# Patient Record
Sex: Male | Born: 1960
Health system: Southern US, Community
[De-identification: ages and names within clinical notes are randomized; demographics above are authoritative.]

## PROBLEM LIST (undated history)

## (undated) DIAGNOSIS — R011 Cardiac murmur, unspecified: Secondary | ICD-10-CM

## (undated) DIAGNOSIS — K635 Polyp of colon: Secondary | ICD-10-CM

## (undated) DIAGNOSIS — B353 Tinea pedis: Secondary | ICD-10-CM

## (undated) DIAGNOSIS — K4090 Unilateral inguinal hernia, without obstruction or gangrene, not specified as recurrent: Secondary | ICD-10-CM

## (undated) DIAGNOSIS — J302 Other seasonal allergic rhinitis: Secondary | ICD-10-CM

## (undated) DIAGNOSIS — E669 Obesity, unspecified: Secondary | ICD-10-CM

## (undated) DIAGNOSIS — G4730 Sleep apnea, unspecified: Secondary | ICD-10-CM

## (undated) DIAGNOSIS — Z22322 Carrier or suspected carrier of Methicillin resistant Staphylococcus aureus: Secondary | ICD-10-CM

## (undated) DIAGNOSIS — Z8249 Family history of ischemic heart disease and other diseases of the circulatory system: Secondary | ICD-10-CM

## (undated) DIAGNOSIS — M654 Radial styloid tenosynovitis [de Quervain]: Secondary | ICD-10-CM

## (undated) DIAGNOSIS — Z720 Tobacco use: Secondary | ICD-10-CM

## (undated) DIAGNOSIS — IMO0001 Reserved for inherently not codable concepts without codable children: Secondary | ICD-10-CM

## (undated) DIAGNOSIS — N451 Epididymitis: Secondary | ICD-10-CM

## (undated) DIAGNOSIS — Z973 Presence of spectacles and contact lenses: Secondary | ICD-10-CM

## (undated) DIAGNOSIS — E785 Hyperlipidemia, unspecified: Secondary | ICD-10-CM

## (undated) DIAGNOSIS — T7840XA Allergy, unspecified, initial encounter: Secondary | ICD-10-CM

## (undated) DIAGNOSIS — Z9989 Dependence on other enabling machines and devices: Secondary | ICD-10-CM

## (undated) DIAGNOSIS — C801 Malignant (primary) neoplasm, unspecified: Secondary | ICD-10-CM

## (undated) DIAGNOSIS — N390 Urinary tract infection, site not specified: Secondary | ICD-10-CM

## (undated) DIAGNOSIS — N529 Male erectile dysfunction, unspecified: Secondary | ICD-10-CM

## (undated) DIAGNOSIS — G4733 Obstructive sleep apnea (adult) (pediatric): Secondary | ICD-10-CM

## (undated) DIAGNOSIS — K649 Unspecified hemorrhoids: Secondary | ICD-10-CM

## (undated) HISTORY — DX: Family history of ischemic heart disease and other diseases of the circulatory system: Z82.49

## (undated) HISTORY — DX: Male erectile dysfunction, unspecified: N52.9

## (undated) HISTORY — PX: ESOPHAGEAL DILATION: SHX303

## (undated) HISTORY — DX: Obesity, unspecified: E66.9

## (undated) HISTORY — PX: ADENOIDECTOMY: SUR15

## (undated) HISTORY — DX: Sleep apnea, unspecified: G47.30

## (undated) HISTORY — DX: Carrier or suspected carrier of methicillin resistant Staphylococcus aureus: Z22.322

## (undated) HISTORY — DX: Tobacco use: Z72.0

## (undated) HISTORY — DX: Malignant (primary) neoplasm, unspecified: C80.1

## (undated) HISTORY — PX: UPPER GASTROINTESTINAL ENDOSCOPY: SHX188

## (undated) HISTORY — DX: Urinary tract infection, site not specified: N39.0

## (undated) HISTORY — DX: Polyp of colon: K63.5

## (undated) HISTORY — DX: Unilateral inguinal hernia, without obstruction or gangrene, not specified as recurrent: K40.90

## (undated) HISTORY — DX: Presence of spectacles and contact lenses: Z97.3

## (undated) HISTORY — DX: Allergy, unspecified, initial encounter: T78.40XA

## (undated) HISTORY — DX: Other seasonal allergic rhinitis: J30.2

## (undated) HISTORY — DX: Epididymitis: N45.1

## (undated) HISTORY — DX: Hyperlipidemia, unspecified: E78.5

## (undated) HISTORY — DX: Radial styloid tenosynovitis (de quervain): M65.4

## (undated) HISTORY — DX: Obstructive sleep apnea (adult) (pediatric): G47.33

## (undated) HISTORY — PX: TONSILLECTOMY: SUR1361

## (undated) HISTORY — DX: Tinea pedis: B35.3

## (undated) HISTORY — DX: Dependence on other enabling machines and devices: Z99.89

## (undated) HISTORY — DX: Reserved for inherently not codable concepts without codable children: IMO0001

## (undated) HISTORY — DX: Unspecified hemorrhoids: K64.9

## (undated) HISTORY — PX: SKIN SURGERY: SHX2413

---

## 1995-01-25 HISTORY — PX: ROTATOR CUFF REPAIR: SHX139

## 1998-06-13 ENCOUNTER — Ambulatory Visit: Admission: RE | Admit: 1998-06-13 | Discharge: 1998-06-13 | Payer: Self-pay | Admitting: Pulmonary Disease

## 1998-08-28 ENCOUNTER — Other Ambulatory Visit: Admission: RE | Admit: 1998-08-28 | Discharge: 1998-08-28 | Payer: Self-pay | Admitting: Otolaryngology

## 1998-08-29 ENCOUNTER — Inpatient Hospital Stay (HOSPITAL_COMMUNITY): Admission: AD | Admit: 1998-08-29 | Discharge: 1998-08-31 | Payer: Self-pay | Admitting: Otolaryngology

## 2000-10-20 ENCOUNTER — Encounter: Payer: Self-pay | Admitting: Gastroenterology

## 2000-10-20 ENCOUNTER — Ambulatory Visit (HOSPITAL_COMMUNITY): Admission: RE | Admit: 2000-10-20 | Discharge: 2000-10-20 | Payer: Self-pay | Admitting: Gastroenterology

## 2001-11-23 ENCOUNTER — Ambulatory Visit (HOSPITAL_BASED_OUTPATIENT_CLINIC_OR_DEPARTMENT_OTHER): Admission: RE | Admit: 2001-11-23 | Discharge: 2001-11-23 | Payer: Self-pay | Admitting: Urology

## 2002-04-30 ENCOUNTER — Encounter: Admission: RE | Admit: 2002-04-30 | Discharge: 2002-04-30 | Payer: Self-pay | Admitting: Family Medicine

## 2002-04-30 ENCOUNTER — Encounter: Payer: Self-pay | Admitting: Family Medicine

## 2002-12-22 ENCOUNTER — Emergency Department (HOSPITAL_COMMUNITY): Admission: EM | Admit: 2002-12-22 | Discharge: 2002-12-22 | Payer: Self-pay | Admitting: Emergency Medicine

## 2003-03-24 ENCOUNTER — Ambulatory Visit (HOSPITAL_BASED_OUTPATIENT_CLINIC_OR_DEPARTMENT_OTHER): Admission: RE | Admit: 2003-03-24 | Discharge: 2003-03-24 | Payer: Self-pay | Admitting: Family Medicine

## 2003-04-10 ENCOUNTER — Emergency Department (HOSPITAL_COMMUNITY): Admission: EM | Admit: 2003-04-10 | Discharge: 2003-04-11 | Payer: Self-pay | Admitting: Emergency Medicine

## 2004-08-23 ENCOUNTER — Encounter: Admission: RE | Admit: 2004-08-23 | Discharge: 2004-08-23 | Payer: Self-pay | Admitting: Family Medicine

## 2004-10-03 ENCOUNTER — Emergency Department (HOSPITAL_COMMUNITY): Admission: EM | Admit: 2004-10-03 | Discharge: 2004-10-03 | Payer: Self-pay | Admitting: Emergency Medicine

## 2005-02-11 ENCOUNTER — Ambulatory Visit: Payer: Self-pay | Admitting: Internal Medicine

## 2005-02-11 ENCOUNTER — Inpatient Hospital Stay (HOSPITAL_COMMUNITY): Admission: EM | Admit: 2005-02-11 | Discharge: 2005-02-12 | Payer: Self-pay | Admitting: Emergency Medicine

## 2005-02-11 HISTORY — PX: CARDIAC CATHETERIZATION: SHX172

## 2005-02-24 ENCOUNTER — Ambulatory Visit: Payer: Self-pay

## 2005-02-24 ENCOUNTER — Encounter: Payer: Self-pay | Admitting: Cardiology

## 2005-03-01 ENCOUNTER — Encounter: Admission: RE | Admit: 2005-03-01 | Discharge: 2005-03-01 | Payer: Self-pay | Admitting: Family Medicine

## 2005-03-11 ENCOUNTER — Ambulatory Visit: Payer: Self-pay | Admitting: Internal Medicine

## 2005-03-22 ENCOUNTER — Emergency Department (HOSPITAL_COMMUNITY): Admission: EM | Admit: 2005-03-22 | Discharge: 2005-03-22 | Payer: Self-pay | Admitting: Family Medicine

## 2005-06-09 ENCOUNTER — Encounter: Admission: RE | Admit: 2005-06-09 | Discharge: 2005-06-09 | Payer: Self-pay | Admitting: Sports Medicine

## 2005-06-30 ENCOUNTER — Encounter: Admission: RE | Admit: 2005-06-30 | Discharge: 2005-06-30 | Payer: Self-pay | Admitting: Sports Medicine

## 2005-07-20 ENCOUNTER — Encounter: Admission: RE | Admit: 2005-07-20 | Discharge: 2005-07-20 | Payer: Self-pay | Admitting: Sports Medicine

## 2005-09-03 ENCOUNTER — Emergency Department (HOSPITAL_COMMUNITY): Admission: EM | Admit: 2005-09-03 | Discharge: 2005-09-03 | Payer: Self-pay | Admitting: Family Medicine

## 2005-09-05 ENCOUNTER — Emergency Department (HOSPITAL_COMMUNITY): Admission: EM | Admit: 2005-09-05 | Discharge: 2005-09-05 | Payer: Self-pay | Admitting: Family Medicine

## 2009-05-18 ENCOUNTER — Emergency Department (HOSPITAL_COMMUNITY): Admission: EM | Admit: 2009-05-18 | Discharge: 2009-05-18 | Payer: Self-pay | Admitting: Family Medicine

## 2009-07-14 ENCOUNTER — Encounter: Payer: Self-pay | Admitting: Family Medicine

## 2009-07-24 ENCOUNTER — Ambulatory Visit: Payer: Self-pay | Admitting: Family Medicine

## 2009-07-24 ENCOUNTER — Encounter: Payer: Self-pay | Admitting: Family Medicine

## 2009-07-24 DIAGNOSIS — F17200 Nicotine dependence, unspecified, uncomplicated: Secondary | ICD-10-CM

## 2009-07-24 LAB — CONVERTED CEMR LAB
BUN: 12 mg/dL (ref 6–23)
CO2: 26 meq/L (ref 19–32)
Calcium: 9.6 mg/dL (ref 8.4–10.5)
Chlamydia, Swab/Urine, PCR: NEGATIVE
Chloride: 106 meq/L (ref 96–112)
Cholesterol: 202 mg/dL — ABNORMAL HIGH (ref 0–200)
Creatinine, Ser: 0.97 mg/dL (ref 0.40–1.50)
GC Probe Amp, Urine: NEGATIVE
Glucose, Bld: 99 mg/dL (ref 70–99)
HCT: 39.9 % (ref 39.0–52.0)
HDL: 44 mg/dL (ref >39)
Hemoglobin: 13.4 g/dL (ref 13.0–17.0)
LDL Cholesterol: 121 mg/dL — ABNORMAL HIGH (ref 0–99)
MCHC: 33.6 g/dL (ref 30.0–36.0)
MCV: 89.3 fL (ref 78.0–100.0)
PSA: 0.35 ng/mL (ref 0.10–4.00)
Platelets: 249 10*3/uL (ref 150–400)
Potassium: 4.4 meq/L (ref 3.5–5.3)
RBC: 4.47 M/uL (ref 4.22–5.81)
RDW: 13.6 % (ref 11.5–15.5)
Sodium: 143 meq/L (ref 135–145)
Total CHOL/HDL Ratio: 4.6
Triglycerides: 187 mg/dL — ABNORMAL HIGH (ref <150)
VLDL: 37 mg/dL (ref 0–40)
WBC: 7 10*3/uL (ref 4.0–10.5)

## 2009-07-28 ENCOUNTER — Telehealth: Payer: Self-pay | Admitting: Family Medicine

## 2009-07-28 ENCOUNTER — Encounter: Payer: Self-pay | Admitting: Family Medicine

## 2009-08-21 ENCOUNTER — Encounter: Payer: Self-pay | Admitting: Family Medicine

## 2009-08-21 ENCOUNTER — Ambulatory Visit: Payer: Self-pay | Admitting: Family Medicine

## 2009-08-21 DIAGNOSIS — L0390 Cellulitis, unspecified: Secondary | ICD-10-CM

## 2009-08-21 DIAGNOSIS — L0291 Cutaneous abscess, unspecified: Secondary | ICD-10-CM | POA: Insufficient documentation

## 2009-08-22 ENCOUNTER — Emergency Department (HOSPITAL_COMMUNITY): Admission: EM | Admit: 2009-08-22 | Discharge: 2009-08-22 | Payer: Self-pay | Admitting: Emergency Medicine

## 2009-08-22 ENCOUNTER — Telehealth: Payer: Self-pay | Admitting: Family Medicine

## 2009-08-27 ENCOUNTER — Ambulatory Visit: Payer: Self-pay | Admitting: Family Medicine

## 2009-09-10 ENCOUNTER — Encounter: Payer: Self-pay | Admitting: Family Medicine

## 2009-09-18 ENCOUNTER — Ambulatory Visit: Payer: Self-pay | Admitting: Family Medicine

## 2009-10-30 ENCOUNTER — Ambulatory Visit: Payer: Self-pay | Admitting: Family Medicine

## 2009-10-30 ENCOUNTER — Inpatient Hospital Stay (HOSPITAL_COMMUNITY): Admission: AD | Admit: 2009-10-30 | Discharge: 2009-11-04 | Payer: Self-pay | Admitting: Family Medicine

## 2009-10-30 DIAGNOSIS — B370 Candidal stomatitis: Secondary | ICD-10-CM

## 2009-11-02 ENCOUNTER — Encounter: Payer: Self-pay | Admitting: Family Medicine

## 2009-11-04 ENCOUNTER — Telehealth: Payer: Self-pay | Admitting: Family Medicine

## 2009-11-05 ENCOUNTER — Telehealth: Payer: Self-pay | Admitting: Family Medicine

## 2009-11-08 ENCOUNTER — Emergency Department (HOSPITAL_COMMUNITY): Admission: EM | Admit: 2009-11-08 | Discharge: 2009-11-08 | Payer: Self-pay | Admitting: Family Medicine

## 2009-11-17 ENCOUNTER — Ambulatory Visit: Payer: Self-pay | Admitting: Family Medicine

## 2009-12-30 ENCOUNTER — Ambulatory Visit: Payer: Self-pay | Admitting: Family Medicine

## 2009-12-30 ENCOUNTER — Encounter: Payer: Self-pay | Admitting: Family Medicine

## 2009-12-30 DIAGNOSIS — L02219 Cutaneous abscess of trunk, unspecified: Secondary | ICD-10-CM

## 2009-12-30 DIAGNOSIS — L03319 Cellulitis of trunk, unspecified: Secondary | ICD-10-CM

## 2010-01-08 ENCOUNTER — Ambulatory Visit: Payer: Self-pay | Admitting: Family Medicine

## 2010-01-08 DIAGNOSIS — R5381 Other malaise: Secondary | ICD-10-CM

## 2010-01-08 DIAGNOSIS — R5383 Other fatigue: Secondary | ICD-10-CM

## 2010-01-27 ENCOUNTER — Encounter: Payer: Self-pay | Admitting: *Deleted

## 2010-02-14 ENCOUNTER — Encounter: Payer: Self-pay | Admitting: Sports Medicine

## 2010-02-23 NOTE — Progress Notes (Signed)
Summary: Rx Ques  Phone Note From Pharmacy Call back at [phone]   Caller: [facility] Summary of Call: Has percription that Dr. Armen Pickup wrote that is not signed and needs verification that this is from her. Initial call taken by: Clydell Hakim,  November 04, 2009 4:10 PM  Follow-up for Phone Call        Pharmacist called Verified prescriptions.  Follow-up by: Dessa Phi MD,  November 04, 2009 5:35 PM

## 2010-02-23 NOTE — Progress Notes (Signed)
  Phone Note Outgoing Call   Call placed by: Angelena Sole MD,  July 28, 2009 8:55 AM Summary of Call: [facility] with patient regarding lab results.  He had no questions.

## 2010-02-23 NOTE — Initial Assessments (Signed)
Summary: sinus/inf/dark urine,tcb   Vital Signs:  Patient profile:   50 year old male Height:      72 inches Weight:      244.1 pounds BMI:     33.23 Temp:     102.8 degrees F oral Pulse rate:   110 / minute BP sitting:   138 / 81  (left arm) Cuff size:   regular  Vitals Entered By: Garen Grams LPN (October 30, 2009 2:15 PM) CC: ?sinus infection, fever, fatigue, boil on back x 1 week Is Patient Diabetic? No Pain Assessment Patient in pain? yes     Location: back   CC:  ?sinus infection, fever, fatigue, and boil on back x 1 week.  History of Present Illness: 1. Abscess: - Been there for about 3 weeks - Located on his right back near his prior abscess - has been getting progressively worse - He was out of town so hasn't been able to make an appt - Acutely worse over the past couple of days - Noticed subjective fevers for the past couple of days - It has been draining and is very painful  PMHx: Has a hx of abscess and cellulitis on his back  ROS: endores malaise, fatigue, myalgias, sinus pressure  Habits & Providers  Alcohol-Tobacco-Diet     Tobacco Status: current     Tobacco Counseling: to quit use of tobacco products     Cigarette Packs/Day: <0.25  Allergies: No Known Drug Allergies  Past History:  Past Medical History: Depression Abscess / Cellulitis  Family History: Reviewed history from 07/14/2009 and no changes required. Father died at age 85 Mother still alive Negative for Heart Disease, DM, Stroke  Social History: Reviewed history from 08/21/2009 and no changes required. 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  Febrile.  Normotensive alert, no acute distress Eyes:  slightly  red conjunctiva Nose:  no nasal discharge.  Moderate sinus pain / pressure Mouth:  + tongue thrush Neck:  supple, full ROM, and no masses.   Lungs:  normal respiratory effort, normal breath sounds.  No crackles or wheezes Heart:  normal rate, regular rhythm, and no murmur.   Abdomen:  soft, non-tender, and normal bowel sounds.   Msk:  no joint tenderness and no joint swelling.   Extremities:  no lower extremity edema Neurologic:  intact Skin:  Back:  Large draining abscess in the right, middle back.  Large area of surrounding induration and cellulitis measuring approximately 20x20cm.  This is near the old area of incision and drainage.   Impression & Recommendations:  Problem # 1:  CELLULITIS AND ABSCESS OF UNSPECIFIED SITE (ICD-682.9) Assessment New Large abscess of back and pt with a significant fever.  He doesn't appear septic at this point but unable to closely monitor for the next couple of days because of the weekend so will admit pt for IV abx and for possible surgical management.  Consulted surgery.  Ordered CBC / CMET / Blood Cxs / UA / UCX.  Problem # 2:  THRUSH (ICD-112.0) Assessment: New Nystatin moul wash.  Have checked him for DM and HIV both of which were negative at prior visits.  May need further immunocompromised work up in the hospital. FEN/GI:  NPO.  NS @ 150cc/hr  PPx: Heparin 5000 U subcutaneously three times a day  Dispo: Pending clinical improvement  Appended Document: sinus/inf/dark urine,tcb    Clinical Lists Changes  Orders: Added new Test order of [facility]- Est Level  5 ([address]) - Signed

## 2010-02-23 NOTE — Letter (Signed)
Summary: Generic Letter  [facility]  [address]   [place], [place] [address]   Phone: [phone]  Fax: [phone]    07/28/2009  DAJOUR PIERPOINT [address] [place], [place]  [address]  Dear Mr. Escoe,  Here is a copy of your lab results.  Everything looked good.  Tests: (1) Chlamydia and GC Probe Amp, Urine (5995)  Chlamydia Probe Amp, Ur                             NEGATIVE                    NEGATIVE           Testing performed using the BD ProbeTec Qx Chlamydia trachomatis and     Neisseria gonorrhea amplified DNA assay.           Performed at:  [facility]                    [address]                    [place], [place] [address]                    [id number]   GC Probe Amp, Urine       NEGATIVE                    NEGATIVE           Testing performed using the BD ProbeTec Qx Chlamydia trachomatis and     Neisseria gonorrhea amplified DNA assay.           Performed at:  [facility]                    [address]                    [place], [place] [address]                    [id number]  Tests: (2) CBC NO Diff (Complete Blood Count) (10000)   WBC                       7.0 K/uL                    4.0-10.5   RBC                       4.47 MIL/uL                 4.22-5.81   Hemoglobin                13.4 g/dL                   75.6-43.3   Hematocrit                39.9 %  39.0-52.0   MCV                       89.3 fL                     78.0-100.0 ! MCH                       30.0 pg                     26.0-34.0   MCHC                      33.6 g/dL                   14.7-82.9   RDW                       13.6 %                      11.5-15.5   Platelet Count            249 K/uL                    150-400  Tests: (3) Basic Metabolic Panel (56213)   Sodium                    143 mEq/L                    135-145   Potassium                 4.4 mEq/L                   3.5-5.3   Chloride                  106 mEq/L                   96-112   CO2                       26 mEq/L                    19-32   Glucose                   99 mg/dL                    08-65   BUN                       12 mg/dL                    7-84   Creatinine                0.97 mg/dL                  0.40-1.50   Calcium                   9.6 mg/dL                   6.9-62.9  Tests: (4) Lipid Profile (52841)   Cholesterol          [H]  202 mg/dL  0-200     ATP III Classification:           < 200        mg/dL        Desirable          200 - 239     mg/dL        Borderline High          >= 240        mg/dL        High         Triglyceride         [H]  187 mg/dL                   <119   HDL Cholesterol           44 mg/dL                    >14   Total Chol/HDL Ratio      4.6 Ratio  VLDL Cholesterol (Calc)                             37 mg/dL                    7-82  LDL Cholesterol (Calc)                        [H]  121 mg/dL                   9-56           Total Cholesterol/HDL Ratio:CHD Risk                            Coronary Heart Disease Risk Table                                            Men       Women              1/2 Average Risk              3.4        3.3                  Average Risk              5.0        4.4              2 X Average Risk              9.6        7.1              3 X Average Risk             23.4       11.0     Use the calculated Patient Ratio above and the CHD Risk table      to determine the patient's CHD Risk.     ATP III Classification (LDL):           < 100        mg/dL         Optimal  100 - 129     mg/dL         Near or Above Optimal          130 - 159     mg/dL         Borderline High          160 - 189     mg/dL         High           > 190        mg/dL         Very High        Tests: (5) HIV 1,2 Antibody, with Reflex (29562)    HIV Antibody              NON REAC                    NON REAC  Tests: (6) PSA (13086)   PSA                       0.35 ng/mL                  0.10-4.00     Test Methodology: Hybritech PSA  Tests: (7) RPR Reflex to T.pallidum Ab, Total (57846)   RPR                       NON REAC                    NON REAC    Sincerely,   Angelena Sole MD  Appended Document: Generic Letter mailed

## 2010-02-23 NOTE — Assessment & Plan Note (Signed)
Summary: h/fup,tcb   Vital Signs:  Patient profile:   50 year old male Height:      72 inches Weight:      244.4 pounds BMI:     33.27 Temp:     99.0 degrees F oral Pulse rate:   98 / minute BP sitting:   106 / 66  (left arm) Cuff size:   large  Vitals Entered By: Garen Grams LPN (November 17, 2009 2:23 PM) CC: hfu Is Patient Diabetic? No Pain Assessment Patient in pain? no        CC:  hfu.  History of Present Illness: Hospital follow up for back abscess:  1. Back abscess:  Pt was discharged from the hospital last Wed. after being treated for a back abscess.  He had I&D x 2 as well as IV antibiotics and discharged with a 14 day course of Bactrim as well as a wound vac which a home health nurse has been coming out and changing daily.  He has been taking the antibiotics as prescribed.  Overall he is doing much better.    ROS: Denies any back pain.  Denies fevers, malaise.  2. Thrush:  Improved.  Not using the Nystating swish / swallow.  Habits & Providers  Alcohol-Tobacco-Diet     Tobacco Status: current     Tobacco Counseling: to quit use of tobacco products     Cigarette Packs/Day: <0.25  Current Medications (verified): 1)  Hydrocodone-Acetaminophen 5-500 Mg Tabs (Hydrocodone-Acetaminophen) .Marland Kitchen.. 1 Tab By Mouth Twice A Day As Needed For Pain 2)  Bactrim Ds 800-160 Mg Tabs (Sulfamethoxazole-Trimethoprim) .Marland Kitchen.. 1 Tab By Mouth Twice A Day 3)  Ibuprofen 400 Mg Tabs (Ibuprofen) .Marland Kitchen.. 1 Tab By Mouth Every 6 Hours As Needed For Pain  Allergies: No Known Drug Allergies  Past History:  Past Medical History: Reviewed history from 10/30/2009 and no changes required. Depression Abscess / Cellulitis  Physical Exam  General:  vitals reviewed.  Afebrile.  Normotensive alert, no acute distress. Mouth:  No thrush appreciated Lungs:  normal respiratory effort, normal breath sounds.  No crackles or wheezes Heart:  normal rate, regular rhythm, and no murmur.   Skin:  Back:   Large ulcer where the abscess had been incised and drained.  Wound vac in place.  Some surrounding erythema.  Not warm.  Non-tender.   Impression & Recommendations:  Problem # 1:  CELLULITIS AND ABSCESS OF UNSPECIFIED SITE (ICD-682.9) Assessment Improved  Much better.  Hospitalization was necessary for surgery and IV abx.  Continue oral abx for 14 day course.  Follow up with surgery on Nov 1st to see if he still needs the wound vac. His updated medication list for this problem includes:    Bactrim Ds 800-160 Mg Tabs (Sulfamethoxazole-trimethoprim) .Marland Kitchen... 1 tab by mouth twice a day  Orders: [facility]- Est Level  3 (99213)  Problem # 2:  THRUSH (ICD-112.0) Assessment: Improved  Resolved  Orders: [facility]- Est Level  3 (04540)  Complete Medication List: 1)  Hydrocodone-acetaminophen 5-500 Mg Tabs (Hydrocodone-acetaminophen) .Marland Kitchen.. 1 tab by mouth twice a day as needed for pain 2)  Bactrim Ds 800-160 Mg Tabs (Sulfamethoxazole-trimethoprim) .Marland Kitchen.. 1 tab by mouth twice a day 3)  Ibuprofen 400 Mg Tabs (Ibuprofen) .Marland Kitchen.. 1 tab by mouth every 6 hours as needed for pain  Patient Instructions: 1)  I'm glad that you are doing better 2)  Keep your appointment with the surgeon on Nov. 1st 3)  Please come back and see me in 3-4  weeks Prescriptions: HYDROCODONE-ACETAMINOPHEN 5-500 MG TABS (HYDROCODONE-ACETAMINOPHEN) 1 tab by mouth twice a day as needed for pain  #20 x 0   Entered and Authorized by:   Angelena Sole MD   Signed by:   Angelena Sole MD on 11/17/2009   Method used:   Print then Give to Patient   RxID:   [id number]    Orders Added: 1)  [facility]- Est Level  3 [[address]]

## 2010-02-23 NOTE — Assessment & Plan Note (Signed)
Summary: F/U/KH   Vital Signs:  Patient profile:   50 year old male Height:      72 inches Weight:      244 pounds BMI:     33.21 BSA:     2.32 Temp:     98.8 degrees F Pulse rate:   74 / minute BP sitting:   98 / 68  Vitals Entered By: Jone Baseman CMA (August 27, 2009 9:07 AM) CC: f/u back Is Patient Diabetic? No Pain Assessment Patient in pain? yes     Location: back Intensity: 2   CC:  f/u back.  History of Present Illness: 1. F/U abscess and cellulitis - Located on left side of middle back - Was seen in clinic last week and had I&D of abscess of back.  It was drained and packed and he was started on Bactrim DS - He has been taking his antibiotics as prescribed - He had to go to ED on Saturday because it was still draining a lot.  They repacked it there - Today he thinks that it is less painful and swollen but it is still draining a lot - Pain rated a 2/10 ROS: denies fevers, chills, malaise, n/v/d  Habits & Providers  Alcohol-Tobacco-Diet     Tobacco Status: current     Tobacco Counseling: to quit use of tobacco products     Cigarette Packs/Day: <0.25  Current Medications (verified): 1)  Bactrim Ds 800-160 Mg Tabs (Sulfamethoxazole-Trimethoprim) .... 2 Tabs By Mouth Twice A Day For 7 Days  Allergies: No Known Drug Allergies  Past History:  Past Medical History: Reviewed history from 07/14/2009 and no changes required. Depression  Social History: Reviewed history from 08/21/2009 and no changes required. 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 alert, no acute distress Lungs:  normal respiratory effort, normal breath sounds, no crackles, and no wheezes.   Heart:  normal rate,  regular rhythm, and no murmur.   Skin:  5cm incision that is patent.  10x10cm area of induration and cellulitis surrounding an abscess that is draining.  Cellulitis with some spread compared to last time.  Less tender.   Impression & Recommendations:  Problem # 1:  CELLULITIS AND ABSCESS OF UNSPECIFIED SITE (ICD-682.9) Assessment Unchanged Not as much improvement as I would have liked.  Explored incision and it is still tracking well.  Will refer to surgery for management.  Will continue Bactrim. His updated medication list for this problem includes:    Bactrim Ds 800-160 Mg Tabs (Sulfamethoxazole-trimethoprim) .Marland Kitchen... 2 tabs by mouth twice a day for 7 days  Orders: Surgical Referral (Surgery) [facility]- Est Level  3 ([address])  Complete Medication List: 1)  Bactrim Ds 800-160 Mg Tabs (Sulfamethoxazole-trimethoprim) .... 2 tabs by mouth twice a day for 7 days Prescriptions: BACTRIM DS 800-160 MG TABS (SULFAMETHOXAZOLE-TRIMETHOPRIM) 2 tabs by mouth twice a day for 7 days  #28 x 0   Entered and Authorized by:   Angelena Sole MD   Signed by:   Angelena Sole MD on 08/27/2009   Method used:   Print then Give to Patient   RxID:   [id number]

## 2010-02-23 NOTE — Consult Note (Signed)
Summary: [facility]   Imported By: Clydell Hakim 11/04/2009 09:17:05  _____________________________________________________________________  External Attachment:    Type:   Image     Comment:   External Document

## 2010-02-23 NOTE — Assessment & Plan Note (Signed)
Summary: NP,tcb   Vital Signs:  Patient profile:   50 year old male Height:      72 inches Weight:      248 pounds BMI:     33.76 BSA:     2.34 Temp:     98.3 degrees F Pulse rate:   93 / minute BP sitting:   132 / 81  Vitals Entered By: Jone Baseman CMA (July 24, 2009 10:00 AM) CC: New Patient Is Patient Diabetic? No Pain Assessment Patient in pain? no        CC:  New Patient.  History of Present Illness: Concerns  1. STDs: has had exposure to some STDs.  He knows that one of his partners had Trich.  He would also like to be checked for everything else  2. Would like his prostate checked  3. Would like general blood work done.  Habits & Providers  Alcohol-Tobacco-Diet     Tobacco Status: current     Tobacco Counseling: to quit use of tobacco products     Cigarette Packs/Day: <0.25  Current Medications (verified): 1)  Metronidazole 500 Mg Tabs (Metronidazole) .... Take 4 Tabs By Mouth X 1  Allergies (verified): No Known Drug Allergies  Past History:  Past Medical History: Reviewed history from 07/14/2009 and no changes required. Depression  Family History: Reviewed history from 07/14/2009 and no changes required. Father died at age 65 Mother still alive Negative for Heart Disease, DM, Stroke  Social History: Reviewed history from 07/14/2009 and no changes required. Lives with mother [place] Works for a [employer] Divorced - Homosexual Current cigarette user Drinks 5 alcoholic drinks per day Exerxices (walks, bikes, lifts weight) 4 times / weekSmoking Status:  current Packs/Day:  <0.25  Physical Exam  General:  vitals reviewed. alert, no acute distress Head:  normocephalic and atraumatic.   Eyes:  vision grossly intact.  PERRL, normal conjunctiva Mouth:  pharynx pink and moist and fair dentition.   Neck:  supple and no masses.   Lungs:  normal respiratory effort, normal breath sounds, no crackles, and no  wheezes.   Heart:  normal rate, regular rhythm, and no murmur.   Abdomen:  soft, non-tender, normal bowel sounds, and no distention.   Extremities:  no lower extremity edema Neurologic:  alert & oriented X3, strength normal in all extremities, and gait normal.   Skin:  turgor normal, color normal, and no rashes.   Psych:  not anxious appearing and not depressed appearing.     Impression & Recommendations:  Problem # 1:  HEALTH MAINTENANCE EXAM, ADULT (ICD-V70.0) Assessment New Will check CBC, BMET, and Lipids for screening Orders: CBC-[facility] (56433)  Problem # 2:  SPECIAL SCREENING MALIGNANT NEOPLASM OF PROSTATE (ICD-V76.44) Assessment: New Check PSA Orders: PSA-[facility] ([id number])  Problem # 3:  TOBACCO ABUSE (ICD-305.1) Assessment: New Advised to quit  Problem # 4:  SEXUALLY TRANSMITTED DISEASE, EXPOSURE TO (ICD-V01.6) Assessment: New Treat for Trich and check for other STDs Orders: GC/Chlamydia-[facility] ([id number]) HIV-[facility] (29518-84166) RPR-[facility] [phone])  Complete Medication List: 1)  Metronidazole 500 Mg Tabs (Metronidazole) .... Take 4 tabs by mouth x 1  Other Orders: Basic Met-[facility] (32355-73220) Lipid-[facility] (254[address]-06237)  Patient Instructions: 1)  It was nice to meet you today 2)  We will check all of those labs that we talked about and I will let you know of the results 3)  I am also going to treat you for that one disease that you were exposed to 4)  Please schedule a follow up appointment with me in 6 months Prescriptions: METRONIDAZOLE 500 MG TABS (METRONIDAZOLE) Take 4 tabs by mouth x 1  #4 x 0   Entered and Authorized by:   Angelena Sole MD   Signed by:   Angelena Sole MD on 07/24/2009   Method used:   Electronically to        [facility] (retail)       [address]       [place], [place]  [address]       Ph: [phone]       Fax: [phone]   RxID:   [id number]   Prevention & Chronic Care Immunizations    Influenza vaccine: Not documented    Tetanus booster: Not documented    Pneumococcal vaccine: Not documented  Other Screening   PSA: Not documented   PSA ordered.   Smoking status: current  (07/24/2009)   Smoking cessation counseling: yes  (07/24/2009)  Lipids   Total Cholesterol: Not documented   LDL: Not documented   LDL Direct: Not documented   HDL: Not documented   Triglycerides: Not documented

## 2010-02-23 NOTE — Assessment & Plan Note (Signed)
Summary: rash on back,df   Vital Signs:  Patient profile:   50 year old male Height:      72 inches Weight:      244.2 pounds BMI:     33.24 Temp:     98.1 degrees F oral Pulse rate:   88 / minute BP sitting:   120 / 78  (left arm) Cuff size:   regular  Vitals Entered By: Garen Grams LPN (August 21, 2009 10:00 AM) CC: painful rash on back x 2 weeks Is Patient Diabetic? No   CC:  painful rash on back x 2 weeks.  History of Present Illness: 1. Rash on back - Has been there for 2 weeks - It started off as a little bump that was itchy - Has been getting progressively worse and is now draining and painful to touch - Described as red, firm, and swollen - It is located in the middle, left side of his back - About 6x6 inches around  ROS: denies fevers, chills, malaise   Procedure note for I&D abscess: - Informed consent obtained - prepped and draped in normal sterile fashion - anesthetized with local lidocaine - incision over the abscess was made following skin lines  ~ 8cm - local exploration for infection - packed with packing - covered with gauze and tape - pt tolerated procedure well - no immediate complications  Habits & Providers  Alcohol-Tobacco-Diet     Tobacco Status: current     Tobacco Counseling: to quit use of tobacco products     Cigarette Packs/Day: <0.25  Current Medications (verified): 1)  Bactrim Ds 800-160 Mg Tabs (Sulfamethoxazole-Trimethoprim) .... 2 Tabs By Mouth Twice A Day For 7 Days  Allergies: No Known Drug Allergies  Past History:  Past Medical History: Reviewed history from 07/14/2009 and no changes required. Depression  Social History: Reviewed history from 07/14/2009 and no changes required. 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 alert, no acute distress Lungs:  normal respiratory effort, normal breath sounds, no crackles, and no wheezes.   Heart:  normal rate, regular rhythm, and no murmur.   Extremities:  no lower extremity edema Skin:  10x10cm area of induration and cellulitis surrounding an abscess that is draining.   Impression & Recommendations:  Problem # 1:  CELLULITIS AND ABSCESS OF UNSPECIFIED SITE (ICD-682.9) Assessment New  I&D with packing.  Bactrim for 7 days.  Follow up in 1 week.  Precautions given. The following medications were removed from the medication list:    Metronidazole 500 Mg Tabs (Metronidazole) .Marland Kitchen... Take 4 tabs by mouth x 1 His updated medication list for this problem includes:    Bactrim Ds 800-160 Mg Tabs (Sulfamethoxazole-trimethoprim) .Marland Kitchen... 2 tabs by mouth twice a day for 7 days  Orders: [facility]- Est  Level 4 ([address]95) I&D Abcess, simple- [facility] (10060)  Complete Medication List: 1)  Bactrim Ds 800-160 Mg Tabs (Sulfamethoxazole-trimethoprim) .... 2 tabs by mouth twice a day for 7 days  Patient Instructions: 1)  The incision and drainage should help 2)  I have put packing in the site, you can remove an inch each day 3)  Take the antibiotics as prescribed 4)  Please follow up with me next week Prescriptions: BACTRIM DS 800-160 MG TABS (SULFAMETHOXAZOLE-TRIMETHOPRIM) 2  tabs by mouth twice a day for 7 days  #28 x 0   Entered and Authorized by:   Angelena Sole MD   Signed by:   Angelena Sole MD on 08/21/2009   Method used:   Print then Give to Patient   RxID:   [id number]

## 2010-02-23 NOTE — Progress Notes (Signed)
  Phone Note From Pharmacy   Caller: [facility] Summary of Call: pharmiscist is wanting to check on rx that was wrote. States that rx doesn't add up to what was wrote for. [phone] Initial call taken by: Jimmy Footman, CMA,  November 05, 2009 4:03 PM  Follow-up for Phone Call        Pharmacist called Pt will return to pharmacist for the second weeks worth of bactrim.  Follow-up by: Dessa Phi MD,  November 06, 2009 12:00 PM

## 2010-02-23 NOTE — Miscellaneous (Signed)
Summary: Consent: I & D Abccess  Consent: I & D Abccess   Imported By: Knox Royalty 12/24/2009 08:44:07  _____________________________________________________________________  External Attachment:    Type:   Image     Comment:   External Document

## 2010-02-23 NOTE — Miscellaneous (Signed)
Summary: Consent for medical procedure  Consent for medical procedure   Imported By: Knox Royalty 01/01/2010 12:21:10  _____________________________________________________________________  External Attachment:    Type:   Image     Comment:   External Document

## 2010-02-23 NOTE — Assessment & Plan Note (Signed)
Summary: F/U/KH   Vital Signs:  Patient profile:   50 year old male Height:      72 inches Weight:      246.8 pounds BMI:     33.59 Temp:     97.7 degrees F oral Pulse rate:   77 / minute BP sitting:   112 / 75  (left arm) Cuff size:   regular  Vitals Entered By: Garen Grams LPN (September 18, 2009 8:40 AM) CC: f/u back Is Patient Diabetic? No Pain Assessment Patient in pain? no        CC:  f/u back.  History of Present Illness: 1. f/u abscess / cellulitis of back:  Pt is being followed by [facility] and surgery for this issue.  It has been about 5 weeks since he first noticed it.  It was I&D'ed in the clinic initially.  It was not getting much better at follow up so he was sent to surgery.  They did not do any other cutting and were just monitoring.  He has finished his course of antibiotics.  The site is no longer draining, bleeding.  It is not tender.  Overall it is much better.  Habits & Providers  Alcohol-Tobacco-Diet     Tobacco Status: current  Current Medications (verified): 1)  None  Allergies: No Known Drug Allergies  Past History:  Past Medical History: Reviewed history from 07/14/2009 and no changes required. Depression  Social History: Reviewed history from 08/21/2009 and no changes required. 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    Physical Exam  General:  vitals reviewed. alert, no acute distress Lungs:  normal respiratory effort,  Skin:  Incision on the back is closed and healing appropriately.  Surrounding area of induration and cellulitis has resolved.  Not tender, warm, or swollen.   Impression & Recommendations:  Problem # 1:  CELLULITIS AND ABSCESS OF  UNSPECIFIED SITE (ICD-682.9) Assessment Improved  Healing appropriately.  Cellulitis resolved.  No further follow up needed. The following medications were removed from the medication list:    Bactrim Ds 800-160 Mg Tabs (Sulfamethoxazole-trimethoprim) .Marland Kitchen... 2 tabs by mouth twice a day for 7 days  Orders: [facility]- Est Level  3 (14782)

## 2010-02-23 NOTE — Progress Notes (Signed)
Summary: I and D  Phone Note Call from Patient   Summary of Call: Pt called and was concerned about amount of blood that was draining from his recent I and D.  Stated he had to change his shirt twice because it was bleeding so much.  He was unsure if the packing some still in.  No fevers, no swelling.  Advised to go to [facility] for further eval.  Pt agreed.

## 2010-02-23 NOTE — Miscellaneous (Signed)
  Clinical Lists Changes  Observations: Added new observation of FAMILY HX: Father died at age 51 Mother still alive Negative for Heart Disease, DM, Stroke (07/14/2009 13:13) Added new observation of SOCIAL HX: Lives with mother [place] Works for a [employer] Divorced - Homosexual Current cigarette user Drinks 5 alcoholic drinks per day Exerxices (walks, bikes, lifts weight) 4 times / week (07/14/2009 13:13) Added new observation of NKA: T (07/14/2009 13:13) Added new observation of PAST SURG HX: None (07/14/2009 13:13) Added new observation of PAST MED HX: Depression (07/14/2009 13:13)      Family History: Father died at age 43 Mother still alive Negative for Heart Disease, DM, Stroke  Social History: Lives with mother [place] Works for a Facilities manager Divorced - Homosexual Current cigarette user Drinks 5 alcoholic drinks per day Exerxices (walks, bikes, lifts weight) 4 times / week   Past History:  Past Medical History: Depression  Past Surgical History: None

## 2010-02-23 NOTE — Assessment & Plan Note (Signed)
Summary: f/u eo   Vital Signs:  Patient profile:   50 year old male Weight:      242.4 pounds (110.18 kg) BMI:     32.99 Temp:     98.5 degrees F (36.94 degrees C) oral Pulse rate:   88 / minute BP sitting:   119 / 82  (left arm)  Vitals Entered By: Kathi Simpers [facility]) (December 30, 2009 4:14 PM)  History of Present Illness: 1. Back abscess:  Pt presents for follow up on a back abscess.  He was hospitalized about 1 month ago for a large back abscess that required I&D x 2 and a wound vac at discharge.  He was followed by surgery and was told that everything was good and that he didn't need to follow up.   2. New back abscess:  He has had this one for the past couple of days.  It is not in the same location as his prior one.  He is not sure how he got this one but is getting tired of dealing with them.  ROS: denies fevers, chills, weight loss, chronic cough  Current Medications (verified): 1)  Hydrocodone-Acetaminophen 5-500 Mg Tabs (Hydrocodone-Acetaminophen) .Marland Kitchen.. 1 Tab By Mouth Twice A Day As Needed For Pain 2)  Bactrim Ds 800-160 Mg Tabs (Sulfamethoxazole-Trimethoprim) .Marland Kitchen.. 1 Tab By Mouth Twice A Day 3)  Ibuprofen 400 Mg Tabs (Ibuprofen) .Marland Kitchen.. 1 Tab By Mouth Every 6 Hours As Needed For Pain 4)  Mupirocin 2 % Oint (Mupirocin) .... Apply To Both Nostrils Twice A Day For 5 Days 5)  Chlorhexidine Gluconate 2 % Liqd (Chlorhexidine Gluconate) .... Daily Baths in Diluted Solution For 5 Days  Allergies: No Known Drug Allergies  Past History:  Past Medical History: Reviewed history from 10/30/2009 and no changes required. Depression Abscess / Cellulitis  Social History: Reviewed history from 08/21/2009 and no changes required. 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  Afebrile.  Normotensive alert, no acute distress. Lungs:  normal respiratory effort, normal breath sounds.  No crackles or wheezes Heart:  normal rate, regular rhythm, and no murmur.   Skin:  Back:  prior abscess is well healed with overlying scar.  New back abscess towards the right flank.  Measures about 3x4cm.  No significant surrounding erythema. Axillary Nodes:  no R axillary adenopathy and no L axillary adenopathy.   Psych:  not anxious appearing and not depressed appearing.   Additional Exam:  Procedure note:  Consent obtained.  Areas was prepped in normal sterile fashion with betadine and alcohol.  Area was anesthesized with Lidocaine.  Abscess with incised with an 11blade.  Purulent material was expressed.  It was then packed and dressed.  No complications.  Minimal blood loss.  Pt tolerated the procedure well.   Impression & Recommendations:  Problem # 1:  CELLULITIS AND ABSCESS OF UNSPECIFIED SITE (ICD-682.9) Assessment Improved Old abscess is doing well.  Healing with scar. His updated medication list for this problem includes:    Bactrim Ds 800-160 Mg Tabs (Sulfamethoxazole-trimethoprim) .Marland Kitchen... 1 tab by mouth twice a day  Orders: Surgical Referral (Surgery) [facility]- Est  Level 4 (16109)  Problem # 2:  BACK ABSCESS (ICD-682.2) Assessment: New  I&D.  Treat with Bactrim.  He has a  difficult time healing from these abscesses.  Given his hx. will also send him for evaluation by surgery to make sure that they don't want to do surgery to make sure that it doesn't get worse.  Pt is having recurrent abscesses.  He would benefit from MRSA decontamination.  Will start that with Mupirocin and Chlrohexidine once this acute infection is treated. His updated medication list for this problem includes:    Bactrim Ds 800-160 Mg Tabs (Sulfamethoxazole-trimethoprim) .Marland Kitchen... 1 tab by mouth twice a day  Orders: [facility]- Est  Level 4 (16109)  Complete  Medication List: 1)  Hydrocodone-acetaminophen 5-500 Mg Tabs (Hydrocodone-acetaminophen) .Marland Kitchen.. 1 tab by mouth twice a day as needed for pain 2)  Bactrim Ds 800-160 Mg Tabs (Sulfamethoxazole-trimethoprim) .Marland Kitchen.. 1 tab by mouth twice a day 3)  Ibuprofen 400 Mg Tabs (Ibuprofen) .Marland Kitchen.. 1 tab by mouth every 6 hours as needed for pain 4)  Mupirocin 2 % Oint (Mupirocin) .... Apply to both nostrils twice a day for 5 days 5)  Chlorhexidine Gluconate 2 % Liqd (Chlorhexidine gluconate) .... Daily baths in diluted solution for 5 days  Other Orders: Colonoscopy (Colon)  Patient Instructions: 1)  I know that you are getting frustrated having to keep dealing with these infections 2)  I will continue to work with you to try and figure out why they keep coming back 3)  We will treat you for this new abscess with antibiotics. 4)  I also want you to follow up with surgery to make sure that they don't want to do more surgery so that it doesn't get worse 5)  Please come back and see me next week Prescriptions: BACTRIM DS 800-160 MG TABS (SULFAMETHOXAZOLE-TRIMETHOPRIM) 1 tab by mouth twice a day  #14 x 0   Entered and Authorized by:   Angelena Sole MD   Signed by:   Angelena Sole MD on 12/30/2009   Method used:   Print then Give to Patient   RxID:   [id number]    Orders Added: 1)  Colonoscopy [Colon] 2)  Surgical Referral [Surgery] 3)  [facility]- Est  Level 4 [95621]    Prevention & Chronic Care Immunizations   Influenza vaccine: Not documented    Tetanus booster: Not documented    Pneumococcal vaccine: Not documented  Colorectal Screening   Hemoccult: Not documented    Colonoscopy: Not documented   Colonoscopy action/deferral: GI Referral  (12/30/2009)  Other Screening   PSA: 0.35  (07/24/2009)   Smoking status: current  (11/17/2009)   Smoking cessation counseling: yes  (07/24/2009)  Lipids   Total Cholesterol: 202  (07/24/2009)   LDL: 121  (07/24/2009)   LDL Direct: Not  documented   HDL: 44  (07/24/2009)   Triglycerides: 187  (07/24/2009)   Nursing Instructions: Screening colonoscopy ordered

## 2010-02-25 NOTE — Miscellaneous (Signed)
Summary: REFERRAL FOR SLEEP STUDY  Pt came in today and gave [place] a his [employer] card. Made a copy of the card. He was informed by nurse that a sleep study will be schedule once a copy of card is made.  Marland KitchenHaydee Monica  January 27, 2010 2:18 PM    Referrals faxed..............................................Marland KitchenGaren Grams LPN January 28, 2010 10:59 AM

## 2010-02-25 NOTE — Assessment & Plan Note (Signed)
Summary: F/U PER MD/RH   Vital Signs:  Patient profile:   50 year old male Height:      72 inches Weight:      240 pounds BMI:     32.67 Temp:     98.7 degrees F oral Pulse rate:   73 / minute BP sitting:   98 / 71  (left arm) Cuff size:   large  Vitals Entered By: Garen Grams LPN (January 08, 2010 8:52 AM) CC: f/u last visit Is Patient Diabetic? Yes Did you bring your meter with you today? No Pain Assessment Patient in pain? no        CC:  f/u last visit.  History of Present Illness: 1. Back abscess:  It is doing much better.  patient was seen in clinic last week with another back abscess.  It was I&D'ed at that visit and he was sent to surgery to make sure no other procedure had to be performed.  They didn't cut it open anymore but told him to try and keep it packed until his follow up appointment here.   ROS: denies fevers, chills, aches, malaise  2. Sleep apnea:  Pt had a sleep study before which diagnosed him with sleep apnea.  He couldn't afford the CPAP machine then and doesn't remember what exactly the study said.  He is still struggling with daytime somnolence.  He does snore a lot at night.  He wakes up with morning headaches.  ROS: denies shortness of breath, chest pain  Habits & Providers  Alcohol-Tobacco-Diet     Tobacco Status: current     Tobacco Counseling: to quit use of tobacco products     Cigarette Packs/Day: <0.25  Current Medications (verified): 1)  Ibuprofen 400 Mg Tabs (Ibuprofen) .Marland Kitchen.. 1 Tab By Mouth Every 6 Hours As Needed For Pain 2)  Mupirocin 2 % Oint (Mupirocin) .... Apply To Both Nostrils Twice A Day For 5 Days 3)  Chlorhexidine Gluconate 2 % Liqd (Chlorhexidine Gluconate) .... Use As Cleansing Solution in Bath/shower Once A Day For 10 Days  Allergies: No Known Drug Allergies  Past History:  Past Medical History: Reviewed history from 10/30/2009 and no changes required. Depression Abscess / Cellulitis  Social History: Reviewed  history from 08/21/2009 and no changes required. 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  Afebrile.  no acute distress. Nose:  Breathing comfortably through his nose. Mouth:  OP pink and moist.  No obstruction. Lungs:  normal respiratory effort, normal breath sounds.  No crackles or wheezes.  Slightly enlarged neck circumference. Heart:  normal rate, regular rhythm, and no murmur.   Abdomen:  soft, non-tender, and normal bowel sounds.   Extremities:  no lower extremity edema Neurologic:  intact Skin:  Back:  prior abscess is well healed with overlying scar.  New back abscess towards the right flank.  is healing well.  1cm incision is still open with about 1 cm depth.  No draining.  No induration or surrounding cellulitis. Psych:  not anxious appearing and not depressed appearing.     Impression & Recommendations:  Problem # 1:  BACK ABSCESS (ICD-682.2) Assessment Improved  Doing much better.  No need to repack.  Not actively infected.  Healing well.  Will start decontamination protocol to try and keep these from  coming back. The following medications were removed from the medication list:    Bactrim Ds 800-160 Mg Tabs (Sulfamethoxazole-trimethoprim) .Marland Kitchen... 1 tab by mouth twice a day  Orders: [facility]- Est  Level 4 (04540)  Problem # 2:  FATIGUE (ICD-780.79) Assessment: New ? sleep apnea diagnosis in the past.  Will send for another sleep study today. Orders: Diagnostic Polysomnogram (Diagnostic Polysomno) [facility]- Est  Level 4 (98119)  Complete Medication List: 1)  Ibuprofen 400 Mg Tabs (Ibuprofen) .Marland Kitchen.. 1 tab by mouth every 6 hours as needed for pain 2)  Mupirocin 2 % Oint (Mupirocin) .... Apply to both nostrils twice a day for 5 days 3)   Chlorhexidine Gluconate 2 % Liqd (Chlorhexidine gluconate) .... Use as cleansing solution in bath/shower once a day for 10 days  Patient Instructions: 1)  I'm glad that your back is doing better 2)  I think that it will continue to heal without further intervention 3)  I am going to try and decontaminate the skin with Mupirocin and Chlorhexidine.  Use them as directed. 4)  I apologize for the mistake on your chart, the correction has been made. 5)  Please schedule a follow up in 3 months Prescriptions: CHLORHEXIDINE GLUCONATE 2 % LIQD (CHLORHEXIDINE GLUCONATE) Use as cleansing solution in bath/shower once a day for 10 days  #1 x 1   Entered and Authorized by:   Angelena Sole MD   Signed by:   Angelena Sole MD on 01/08/2010   Method used:   Print then Give to Patient   RxID:   [id number] MUPIROCIN 2 % OINT (MUPIROCIN) Apply to both nostrils twice a day for 5 days  #1 x 1   Entered and Authorized by:   Angelena Sole MD   Signed by:   Angelena Sole MD on 01/08/2010   Method used:   Print then Give to Patient   RxID:   [id number]    Orders Added: 1)  Diagnostic Polysomnogram [Diagnostic Polysomno] 2)  [facility]- Est  Level 4 [01027]

## 2010-03-02 ENCOUNTER — Ambulatory Visit (HOSPITAL_BASED_OUTPATIENT_CLINIC_OR_DEPARTMENT_OTHER): Payer: Self-pay | Attending: Family Medicine

## 2010-03-02 DIAGNOSIS — G4733 Obstructive sleep apnea (adult) (pediatric): Secondary | ICD-10-CM | POA: Insufficient documentation

## 2010-03-06 DIAGNOSIS — G4710 Hypersomnia, unspecified: Secondary | ICD-10-CM

## 2010-03-06 DIAGNOSIS — G4730 Sleep apnea, unspecified: Secondary | ICD-10-CM

## 2010-03-11 ENCOUNTER — Ambulatory Visit (INDEPENDENT_AMBULATORY_CARE_PROVIDER_SITE_OTHER): Payer: Self-pay | Admitting: Family Medicine

## 2010-03-11 VITALS — BP 117/80 | HR 101 | Temp 98.1°F | Ht 73.0 in | Wt 252.5 lb

## 2010-03-11 DIAGNOSIS — G4733 Obstructive sleep apnea (adult) (pediatric): Secondary | ICD-10-CM | POA: Insufficient documentation

## 2010-03-11 DIAGNOSIS — K029 Dental caries, unspecified: Secondary | ICD-10-CM

## 2010-03-11 DIAGNOSIS — G4730 Sleep apnea, unspecified: Secondary | ICD-10-CM

## 2010-03-11 DIAGNOSIS — L02219 Cutaneous abscess of trunk, unspecified: Secondary | ICD-10-CM

## 2010-03-11 DIAGNOSIS — R5383 Other fatigue: Secondary | ICD-10-CM

## 2010-03-11 DIAGNOSIS — Z0100 Encounter for examination of eyes and vision without abnormal findings: Secondary | ICD-10-CM

## 2010-03-11 DIAGNOSIS — R5381 Other malaise: Secondary | ICD-10-CM

## 2010-03-11 LAB — GLUCOSE, CAPILLARY: Glucose-Capillary: 90 mg/dL (ref 70–99)

## 2010-03-11 NOTE — Progress Notes (Signed)
  Subjective:    Patient ID: Roger Mendoza, male    DOB: 08-19-1960, 50 y.o.   MRN: [id number]  Rash The current episode started more than 1 month ago. The problem has been resolved since onset. The affected locations include the back. Rash characteristics: abscess. Treatments tried: I&D. The treatment provided significant relief.   FATIGUE  Onset: 1 years ago  Fatigue with exertion: no  Physical limitations: no Primarily motivational fatigue: no Primarily physical fatigue: yes  Symptoms Fever: no  Night sweats: no  Weight loss: no   Exertional chest pain: no  Dyspnea: no  Cough: no  Hemoptysis: no  New medications: no  Leg swelling: no  Orthopnea: no  PND: no  Melena: no  Adenopathy: no  Severe snoring: yes  Daytime sleepiness: yes  Skin changes: yes  Feeling depressed: no  Anhedonia: no  Altered appetite: no  Poor sleep: yes   Dental Carries: Has had them for months.  Would like a referral to a dentist.   Review of Systems  Skin: Positive for rash.       Objective:   Physical Exam  Constitutional: He appears well-developed and well-nourished. No distress.  HENT:  Head: Normocephalic and atraumatic.  Eyes: Conjunctivae are normal.  Cardiovascular: Normal rate and regular rhythm.   Pulmonary/Chest: Effort normal and breath sounds normal.  Abdominal: Soft. Bowel sounds are normal.  Musculoskeletal: Normal range of motion.  Skin:       Abscess well healed   Psychiatric: He has a normal mood and affect.          Assessment & Plan:

## 2010-03-11 NOTE — Assessment & Plan Note (Signed)
Well-healed

## 2010-03-11 NOTE — Assessment & Plan Note (Signed)
+   sleep study according to patient.  Do not see any results from [facility].  Will try and get those records.

## 2010-03-11 NOTE — Patient Instructions (Signed)
I have put in the referral to the Dentist We will keep an eye out for the sleep study.  Try and get a copy of the report and have it sent to the office Please schedule a follow up appointment in a couple months to make sure nothing fell through the cracks

## 2010-03-12 ENCOUNTER — Encounter: Payer: Self-pay | Admitting: Family Medicine

## 2010-03-12 DIAGNOSIS — G4730 Sleep apnea, unspecified: Secondary | ICD-10-CM

## 2010-03-23 ENCOUNTER — Encounter: Payer: Self-pay | Admitting: Family Medicine

## 2010-03-23 NOTE — Progress Notes (Signed)
  Subjective:    Patient ID: Roger Mendoza, male    DOB: [birth year], 50 y.o.   MRN: [id number]  HPI    Review of Systems     Objective:   Physical Exam        Assessment & Plan:

## 2010-04-07 ENCOUNTER — Ambulatory Visit (HOSPITAL_BASED_OUTPATIENT_CLINIC_OR_DEPARTMENT_OTHER): Payer: Self-pay | Attending: Family Medicine

## 2010-04-07 DIAGNOSIS — G4710 Hypersomnia, unspecified: Secondary | ICD-10-CM | POA: Insufficient documentation

## 2010-04-07 LAB — URINALYSIS, ROUTINE W REFLEX MICROSCOPIC
Nitrite: NEGATIVE
Protein, ur: NEGATIVE mg/dL
Urobilinogen, UA: 1 mg/dL (ref 0.0–1.0)

## 2010-04-07 LAB — COMPREHENSIVE METABOLIC PANEL
ALT: 26 U/L (ref 0–53)
ALT: 29 U/L (ref 0–53)
AST: 28 U/L (ref 0–37)
Albumin: 3.8 g/dL (ref 3.5–5.2)
Alkaline Phosphatase: 100 U/L (ref 39–117)
Alkaline Phosphatase: 111 U/L (ref 39–117)
BUN: 7 mg/dL (ref 6–23)
CO2: 26 mEq/L (ref 19–32)
CO2: 28 mEq/L (ref 19–32)
Calcium: 8.2 mg/dL — ABNORMAL LOW (ref 8.4–10.5)
Calcium: 8.8 mg/dL (ref 8.4–10.5)
Chloride: 90 mEq/L — ABNORMAL LOW (ref 96–112)
Creatinine, Ser: 1.04 mg/dL (ref 0.4–1.5)
GFR calc Af Amer: >60 mL/min (ref >60)
GFR calc non Af Amer: >60 mL/min (ref >60)
GFR calc non Af Amer: >60 mL/min (ref >60)
Glucose, Bld: 111 mg/dL — ABNORMAL HIGH (ref 70–99)
Glucose, Bld: 116 mg/dL — ABNORMAL HIGH (ref 70–99)
Potassium: 2.8 mEq/L — ABNORMAL LOW (ref 3.5–5.1)
Sodium: 131 mEq/L — ABNORMAL LOW (ref 135–145)
Sodium: 134 mEq/L — ABNORMAL LOW (ref 135–145)
Total Bilirubin: 1.5 mg/dL — ABNORMAL HIGH (ref 0.3–1.2)
Total Protein: 7.8 g/dL (ref 6.0–8.3)

## 2010-04-07 LAB — BASIC METABOLIC PANEL
BUN: 5 mg/dL — ABNORMAL LOW (ref 6–23)
CO2: 30 mEq/L (ref 19–32)
Calcium: 7.8 mg/dL — ABNORMAL LOW (ref 8.4–10.5)
Calcium: 8 mg/dL — ABNORMAL LOW (ref 8.4–10.5)
Calcium: 8.4 mg/dL (ref 8.4–10.5)
Calcium: 8.9 mg/dL (ref 8.4–10.5)
Chloride: 105 mEq/L (ref 96–112)
Creatinine, Ser: 0.77 mg/dL (ref 0.4–1.5)
Creatinine, Ser: 0.81 mg/dL (ref 0.4–1.5)
Creatinine, Ser: 0.91 mg/dL (ref 0.4–1.5)
Creatinine, Ser: 0.94 mg/dL (ref 0.4–1.5)
GFR calc Af Amer: >60 mL/min (ref >60)
GFR calc Af Amer: >60 mL/min (ref >60)
GFR calc Af Amer: >60 mL/min (ref >60)
GFR calc Af Amer: >60 mL/min (ref >60)
GFR calc non Af Amer: >60 mL/min (ref >60)
GFR calc non Af Amer: >60 mL/min (ref >60)
GFR calc non Af Amer: >60 mL/min (ref >60)
GFR calc non Af Amer: >60 mL/min (ref >60)
GFR calc non Af Amer: >60 mL/min (ref >60)
Potassium: 3.4 mEq/L — ABNORMAL LOW (ref 3.5–5.1)
Potassium: 4.6 mEq/L (ref 3.5–5.1)
Sodium: 137 mEq/L (ref 135–145)
Sodium: 139 mEq/L (ref 135–145)

## 2010-04-07 LAB — CBC
HCT: 31.5 % — ABNORMAL LOW (ref 39.0–52.0)
HCT: 35 % — ABNORMAL LOW (ref 39.0–52.0)
Hemoglobin: 10.6 g/dL — ABNORMAL LOW (ref 13.0–17.0)
Hemoglobin: 11.1 g/dL — ABNORMAL LOW (ref 13.0–17.0)
Hemoglobin: 12.3 g/dL — ABNORMAL LOW (ref 13.0–17.0)
Hemoglobin: 9.9 g/dL — ABNORMAL LOW (ref 13.0–17.0)
MCH: 29.7 pg (ref 26.0–34.0)
MCH: 30.2 pg (ref 26.0–34.0)
MCHC: 33.8 g/dL (ref 30.0–36.0)
MCHC: 35.1 g/dL (ref 30.0–36.0)
MCHC: 35.2 g/dL (ref 30.0–36.0)
MCV: 86 fL (ref 78.0–100.0)
MCV: 87.6 fL (ref 78.0–100.0)
Platelets: 339 10*3/uL (ref 150–400)
Platelets: 363 10*3/uL (ref 150–400)
Platelets: 391 10*3/uL (ref 150–400)
Platelets: 443 10*3/uL — ABNORMAL HIGH (ref 150–400)
RBC: 3.4 MIL/uL — ABNORMAL LOW (ref 4.22–5.81)
RBC: 3.41 MIL/uL — ABNORMAL LOW (ref 4.22–5.81)
RBC: 3.57 MIL/uL — ABNORMAL LOW (ref 4.22–5.81)
RBC: 4.07 MIL/uL — ABNORMAL LOW (ref 4.22–5.81)
RDW: 12.9 % (ref 11.5–15.5)
WBC: 14.3 10*3/uL — ABNORMAL HIGH (ref 4.0–10.5)
WBC: 15.1 10*3/uL — ABNORMAL HIGH (ref 4.0–10.5)
WBC: 24.6 10*3/uL — ABNORMAL HIGH (ref 4.0–10.5)

## 2010-04-07 LAB — CULTURE, BLOOD (ROUTINE X 2)
Culture  Setup Time: 201110072030
Culture  Setup Time: 201110072030
Culture: NO GROWTH
Culture: NO GROWTH

## 2010-04-07 LAB — CULTURE, ROUTINE-ABSCESS

## 2010-04-07 LAB — TISSUE CULTURE

## 2010-04-07 LAB — URINE CULTURE
Colony Count: NO GROWTH
Culture: NO GROWTH

## 2010-04-07 LAB — ANAEROBIC CULTURE

## 2010-04-07 LAB — URINE MICROSCOPIC-ADD ON

## 2010-04-07 LAB — HIV ANTIBODY (ROUTINE TESTING W REFLEX): HIV: NONREACTIVE

## 2010-04-07 LAB — WOUND CULTURE

## 2010-04-07 LAB — VANCOMYCIN, TROUGH: Vancomycin Tr: 29.3 ug/mL (ref 10.0–20.0)

## 2010-04-07 LAB — HEMOGLOBIN A1C: Mean Plasma Glucose: 100 mg/dL (ref <117)

## 2010-04-10 DIAGNOSIS — G4730 Sleep apnea, unspecified: Secondary | ICD-10-CM

## 2010-04-10 DIAGNOSIS — G4710 Hypersomnia, unspecified: Secondary | ICD-10-CM

## 2010-04-15 ENCOUNTER — Telehealth: Payer: Self-pay | Admitting: Family Medicine

## 2010-04-15 NOTE — Telephone Encounter (Signed)
Placed sleep study in MD box with instructions.

## 2010-04-15 NOTE — Telephone Encounter (Signed)
Asking for sleep study results

## 2010-04-19 NOTE — Telephone Encounter (Signed)
Referral faxed to [facility] for CPAP, they will contact patient.

## 2010-05-10 ENCOUNTER — Ambulatory Visit (INDEPENDENT_AMBULATORY_CARE_PROVIDER_SITE_OTHER): Payer: Self-pay | Admitting: Family Medicine

## 2010-05-10 ENCOUNTER — Encounter: Payer: Self-pay | Admitting: Family Medicine

## 2010-05-10 DIAGNOSIS — Z125 Encounter for screening for malignant neoplasm of prostate: Secondary | ICD-10-CM

## 2010-05-10 DIAGNOSIS — Z1211 Encounter for screening for malignant neoplasm of colon: Secondary | ICD-10-CM

## 2010-05-10 DIAGNOSIS — J309 Allergic rhinitis, unspecified: Secondary | ICD-10-CM

## 2010-05-10 MED ORDER — CETIRIZINE HCL 10 MG PO TABS: 10.0000 mg | ORAL_TABLET | Freq: Every day | ORAL | Status: DC

## 2010-05-10 MED ORDER — FLUTICASONE PROPIONATE 50 MCG/ACT NA SUSP: 2.0000 | Freq: Every day | NASAL | Status: DC

## 2010-05-10 NOTE — Patient Instructions (Signed)
I have sent medications to the [facility] outpatient pharmacy for you to pick up.  If they do not have these in stock, call and I will send them over to your other pharmacy.  If you are not improving in 7-10 days please call our office.

## 2010-05-10 NOTE — Assessment & Plan Note (Addendum)
Signs and symptoms consistent with seasonal allergies and allergic rhinitis.  Do not think has sinus infection at this time.   Will give trial of zyrtec and flonase.  If not improving or getting worse instructed to call back.

## 2010-05-10 NOTE — Progress Notes (Addendum)
  Subjective:    Patient ID: Roger Mendoza, male    DOB: 21-Dec-1960, 50 y.o.   MRN: [id number]  HPI Has had congestion and sinus pressure for the past week.  Also with red itchy eyes.  Difficulty breathing through nose at times and did have hoarseness until a couple days ago, starting to get voice back now.  Cough productive for green sputum, no blood.   Smokes occasionally when he drinks, <1/2 pack per week.  Has had seasonal allergies in the past.  Currently not taking any medications.  Denies fever/chills, nausea, vomiting, diarrhea, tooth pain, purulent nasal drainage.   Review of Systems See HPI    Objective:   Physical Exam  Constitutional: He appears well-developed and well-nourished. No distress.  HENT:  Head: Normocephalic and atraumatic.  Right Ear: External ear normal.  Left Ear: External ear normal.  Nose: Mucosal edema present. No rhinorrhea. Right sinus exhibits no maxillary sinus tenderness and no frontal sinus tenderness. Left sinus exhibits no maxillary sinus tenderness and no frontal sinus tenderness.  Mouth/Throat: Oropharynx is clear and moist and mucous membranes are normal.  Neck: Neck supple.  Cardiovascular: Normal rate, regular rhythm and normal heart sounds.   Pulmonary/Chest: Effort normal and breath sounds normal. He has no wheezes.  Lymphadenopathy:    He has no cervical adenopathy.        Pt returned stool cards for the [facility] study (pt # [id number]),  I developed them incorrectly so there are no results.  The [facility] Study individuals have been notified of the error. Bonnie [place], MLS

## 2010-05-20 NOTE — Progress Notes (Signed)
Addended by: [place], Hasten Sweitzer on: 05/20/2010 05:06 PM   Modules accepted: Orders

## 2010-06-11 ENCOUNTER — Encounter: Payer: Self-pay | Admitting: Family Medicine

## 2010-06-11 NOTE — Cardiovascular Report (Signed)
NAME:  Roger Mendoza, Roger Mendoza NO.:  [id number]   MEDICAL RECORD NO.:  [id number]          PATIENT TYPE:  INP   LOCATION:  2025                         FACILITY:  [facility]   PHYSICIAN:  Lyn Records, M.D.   DATE OF BIRTH:  20-Aug-1960   DATE OF PROCEDURE:  02/11/2005  DATE OF DISCHARGE:                              CARDIAC CATHETERIZATION   INDICATION FOR PROCEDURE:  Acute chest pain with mid anterior lead ST  elevation.  This study is being done to rule out acute coronary occlusion.   PROCEDURE PERFORMED:  1.  Left heart cath.  2.  Selective coronary angio.  3.  Left ventriculography.  4.  Angioseal arteriotomy.   DESCRIPTION OF PROCEDURE:  After informed consent, a 6-French sheath was  placed in the right femoral artery using a modified Seldinger technique.  6-  [place] #4 left and right Judkins catheters and an angled 6-French pigtail  were used for diagnostic evaluation.  Angioseal was performed with good  hemostasis.  No complications.   RESULTS:  I:  Hemodynamic data:  A.  Aortic pressure 113/72.  B.  Left ventricular pressure 124/15 mmHg.   II:  Left ventriculography:  The left ventricle is normal in size, and contractility was normal, the EF  was 60%, no MR, no focal wall motion abnormality.   III:  Coronary angiography:  A.  Left main coronary:  Normal.  B.  Left anterior descending coronary:  LAD reaches the left ventricular  apex.  It gives origin to a large diagonal.  Irregularities are noted in the  proximal diagonal and LAD.  No high grade obstruction is noted.  C.  Circumflex artery:  A large circumflex territory with three obtuse  marginal branches.  The third obtuse marginal is dominant.  No significant  disease is noted.  D.  Right coronary:  This is a large, dominant vessel with no obstruction.  The PDA is large and supplies the entire inferior wall and reaches the left  ventricular apex.  IV:  Successful Angioseal.   CONCLUSIONS:  1.   Essentially normal coronaries with minimal luminal irregularities noted      in the proximal and mid left anterior descending and diagonal #1.  The      circumflex and right coronaries are essentially normal.  2.  Normal left ventricular function.  3.  Abnormal electrocardiogram possibly related to early repolarization      versus early pericarditis.   PLAN:  Per [facility].  Consider antiinflammatory therapy.  Consider  echocardiogram.  Will withhold antithrombotic therapy.      Lyn Records, M.D.  Electronically Signed     HWS/MEDQ  D:  02/11/2005  T:  02/12/2005  Job:  [id number]   cc:   Pricilla Riffle, M.D.  [address]  [address]  [place]  [place] [address]   Dr. Lindajo Royal ????  [place], [place].

## 2010-06-11 NOTE — Discharge Summary (Signed)
NAME:  Roger Mendoza, KONDRACKI NO.:  [id number]   MEDICAL RECORD NO.:  [id number]          PATIENT TYPE:  INP   LOCATION:  2025                         FACILITY:  [facility]   PHYSICIAN:  Rollene Rotunda, M.D.   DATE OF BIRTH:  01/24/1961   DATE OF ADMISSION:  02/11/2005  DATE OF DISCHARGE:  02/12/2005                                 DISCHARGE SUMMARY   PRINCIPAL DIAGNOSIS:  Chest pain.   OTHER DIAGNOSES:  1.  GERD.  2.  Seasonal allergies.  3.  Tobacco abuse.   ALLERGIES:  No known drug allergies.   PROCEDURE:  Left heart cardiac catheterization.   HISTORY OF PRESENT ILLNESS:  A 50 year old [place] American male with no  prior history of CAD who presented to the [facility] ED on February 11, 2005,  with approximately 3.5 hours history of 8/10 chest pain with mild shortness  of breath. In the emergency department he was noted to have 2-3 mm anterior  ST-elevation in leads V1, V2, and worse in V3, without reciprocal changes.  The patient continued to have chest pain and was taken emergently to the  cardiac catheterization lab for suspected acute anterior myocardial  infarction.   HOSPITAL COURSE:  A cardiac catheterization was performed revealing normal  LV function with an EF of approximately 60% as well as essentially normal  coronary arteries with only luminal irregularities in the LAD and first  diagonal. It was unclear as to what the cause of his ECG changes. Following  catheterization cardiac markers remained negative times two.  He continued  to complain of shoulder pain that was successfully treated with NSAIDs.  There was low suspicion for pericarditis. The patient is being discharged  home this afternoon with plans for outpatient echocardiogram to be performed  this coming week and follow up with Dr. Dietrich Pates of cardiology clinic in  approximately two weeks.   DISCHARGE LABORATORY DATA:  Hemoglobin 14.3, hematocrit 41.6, WBC 9.0,  platelet count 265,000, MCV  89.9. Sodium 140, potassium 4.0, chloride 108,  CO2 26, BUN 7, creatinine 1.0, glucose 105.  PT 13.3, INR 1.0, PTT 28. Total  bilirubin 0.8, alkaline phosphatase 65, AST 23, ALT 16, albumin 4.3. Cardiac  markers were negative times two. Total cholesterol 138, triglycerides 128,  HDL 34, LDL 78. Calcium 8.7, magnesium 2.4. Fecal occult blood was negative.  BNP was less than 30. C-reactive protein was 0.3. Hemoglobin A1c was 4.5.   DISPOSITION:  The patient is being discharged home in good condition.   FOLLOWUP PLANS AND APPOINTMENTS:  He will be contacted by [facility]  office to establish a date for echocardiogram on February 14, 2005, as well  as follow-up appointment with Dr. Dietrich Pates in the cardiology clinic in  approximately two weeks.   DISCHARGE MEDICATIONS:  1.  Prevacid as previously prescribed.  2.  Allegra D as previously prescribed.  3.  Flonase as previously prescribed.  4.  Extra Strength Tylenol two tabs q.6h. p.r.n. pain.   OUTSTANDING LABORATORY STUDIES:  None.   DURATION OF DISCHARGE ENCOUNTER:  30 minutes including physician time.  Ok Anis, NP    ______________________________  Rollene Rotunda, M.D.    CRB/MEDQ  D:  02/12/2005  T:  02/13/2005  Job:  [id number]   cc:   Pricilla Riffle, M.D.  [address]  [address]  [place]  [place] [address]

## 2010-06-11 NOTE — H&P (Signed)
NAME:  Roger Mendoza, Roger Mendoza NO.:  [id number]   MEDICAL RECORD NO.:  [id number]          PATIENT TYPE:  INP   LOCATION:  2025                         FACILITY:  [facility]   PHYSICIAN:  Pricilla Riffle, M.D.    DATE OF BIRTH:  March 07, 1960   DATE OF ADMISSION:  02/11/2005  DATE OF DISCHARGE:                                HISTORY & PHYSICAL   IDENTIFICATION:  The patient is a 50 year old with no known history of  coronary artery disease who presents with a several day history of chest  pain on and off with mild shortness of breath.  Today, he notes his pain  began about 1 p.m., 8 out of 10 in intensity, mild shortness of breath, no  relieving symptoms, came to ER.   ALLERGIES:  None.   PAST MEDICAL HISTORY:  1.  Allergies.  2.  Reflux.   MEDICATIONS:  Prevacid, Allegra-D.   SOCIAL HISTORY:  The patient smokes about one pack per week.  He drinks  occasionally.   FAMILY HISTORY:  Significant for CAD on the father's side and hypertension.   REVIEW OF SYSTEMS:  All systems reviewed, negative to the above problem  except as noted above.   PHYSICAL EXAMINATION:  GENERAL:  The patient is in mild distress secondary  to chest pressure.  VITAL SIGNS:  Pulse 82, blood pressure is 119/77, O2 sat on room air is  100%.  HEENT:  Normocephalic, atraumatic.  PERRL.  EOMI.  NECK:  No bruits.  JVP is normal.  LUNGS:  Clear to auscultation.  CARDIAC:  Regular rate and rhythm.  S1 S2.  No S3, question S4.  ABDOMEN:  Supple.  No hepatomegaly.  EXTREMITIES:  With 2+ distal pulses throughout.  No lower extremity edema.  Feet warm.   LABORATORY:  Pending.   A 12-lead EKG:  Normal sinus rhythm, rate of 83 beats per minute, LVH, ST  elevation at 1.5-mm-to-3-mm V1, V3 - most significant in V3, trivial ST  depression in lead III.   IMPRESSION:  The patient is a 50 year old gentleman with a history of chest  pain over the past several days, coming and going, began again at 1 p.m.  today,  more severe.  EKG with changes consistent with an anterior myocardial  infarction.   PLAN:  1.  For left heart cath to define anatomy with PTCA.  Risks, benefits      explained.  The patient understands, agrees to proceed.  Dr. Garnette Scheuermann      will perform.  2.  Check labs.  3.  Check lipids in a.m.  4.  Counsel on tobacco use.           ______________________________  Pricilla Riffle, M.D.     PVR/MEDQ  D:  02/11/2005  T:  02/12/2005  Job:  [id number]

## 2010-06-14 ENCOUNTER — Encounter: Payer: Self-pay | Admitting: Family Medicine

## 2010-07-02 ENCOUNTER — Ambulatory Visit (INDEPENDENT_AMBULATORY_CARE_PROVIDER_SITE_OTHER): Payer: Self-pay | Admitting: Family Medicine

## 2010-07-02 ENCOUNTER — Encounter: Payer: Self-pay | Admitting: Family Medicine

## 2010-07-02 DIAGNOSIS — Z202 Contact with and (suspected) exposure to infections with a predominantly sexual mode of transmission: Secondary | ICD-10-CM

## 2010-07-02 DIAGNOSIS — Z13220 Encounter for screening for lipoid disorders: Secondary | ICD-10-CM

## 2010-07-02 DIAGNOSIS — Z125 Encounter for screening for malignant neoplasm of prostate: Secondary | ICD-10-CM

## 2010-07-02 DIAGNOSIS — K4090 Unilateral inguinal hernia, without obstruction or gangrene, not specified as recurrent: Secondary | ICD-10-CM

## 2010-07-02 DIAGNOSIS — G4730 Sleep apnea, unspecified: Secondary | ICD-10-CM

## 2010-07-02 DIAGNOSIS — Z0000 Encounter for general adult medical examination without abnormal findings: Secondary | ICD-10-CM

## 2010-07-02 DIAGNOSIS — Z1211 Encounter for screening for malignant neoplasm of colon: Secondary | ICD-10-CM

## 2010-07-02 DIAGNOSIS — J309 Allergic rhinitis, unspecified: Secondary | ICD-10-CM

## 2010-07-02 DIAGNOSIS — F17200 Nicotine dependence, unspecified, uncomplicated: Secondary | ICD-10-CM

## 2010-07-02 LAB — LIPID PANEL
Cholesterol: 197 mg/dL (ref 0–200)
HDL: 38 mg/dL — ABNORMAL LOW (ref >39)
Triglycerides: 78 mg/dL (ref <150)

## 2010-07-02 LAB — HIV ANTIBODY (ROUTINE TESTING W REFLEX): HIV: NONREACTIVE

## 2010-07-02 LAB — PSA: PSA: 0.25 ng/mL (ref <=4.00)

## 2010-07-02 MED ORDER — FLUTICASONE PROPIONATE 50 MCG/ACT NA SUSP: 2.0000 | Freq: Every day | NASAL | Status: DC

## 2010-07-02 NOTE — Patient Instructions (Signed)
It does feel like you have a small hernia.  If it does not get better you will likely need to see a Careers adviser. I will let you know of the lab results Please make a follow up appointment in 3 months

## 2010-07-02 NOTE — Assessment & Plan Note (Signed)
Stable, continue Flonase.  

## 2010-07-02 NOTE — Assessment & Plan Note (Signed)
No need for immediate surgical repair.  Advised him to not lift anything heavy for the next couple of weeks.  If not improved or worsens will need to see a Careers adviser.  Reviewed precautions.

## 2010-07-02 NOTE — Assessment & Plan Note (Signed)
Fatigue much improved since starting CPAP.

## 2010-07-02 NOTE — Progress Notes (Signed)
  Subjective:    Patient ID: Roger Mendoza, male    DOB: 1960-11-03, 50 y.o.   MRN: [id number]  HPI Adult Physical:  Conditions 1. Sleep apnea: improved with CPAP 2. Allergies: controlled with Zyrtec  Complaints 1. Right groin pain:  He has been lifting a lot of heavy objects recently.  Has had groin pain for about 1 week.  He has not noticed any masses or lumps in his scrotum.  Screening 1. Needs colonoscopy 2. Discussed PSA screening and risks and benefits and he opted into having it checked 3. Lipids 4. Would like screening for HIV, RPR - He is getting married in a couple months and would like to be checked out  Health maintanence: 1. No regular exercise 2. Ocassional alcohol and ocassional tobacco use    Review of Systems  Constitutional: Negative for fever, chills, fatigue and unexpected weight change.  HENT: Negative for sneezing, neck pain and neck stiffness.   Eyes: Negative for visual disturbance.  Respiratory: Negative for apnea, shortness of breath and wheezing.   Cardiovascular: Negative for chest pain.  Gastrointestinal: Negative for abdominal distention.  Genitourinary: Negative for dysuria, urgency, decreased urine volume, discharge, penile swelling, scrotal swelling, difficulty urinating, penile pain and testicular pain.  Musculoskeletal: Positive for back pain. Negative for arthralgias.  Neurological: Negative for headaches.  Hematological: Negative for adenopathy.       Objective:   Physical Exam  Vitals reviewed. Constitutional: He appears well-nourished. No distress.  HENT:  Head: Normocephalic and atraumatic.  Right Ear: External ear normal.  Left Ear: External ear normal.  Mouth/Throat: No oropharyngeal exudate.  Eyes: Conjunctivae are normal. Pupils are equal, round, and reactive to light. No scleral icterus.  Neck: Normal range of motion. Neck supple. No JVD present.  Cardiovascular: Normal rate, regular rhythm and normal heart sounds.     No murmur heard. Pulmonary/Chest: Effort normal and breath sounds normal. No respiratory distress. He has no wheezes.  Abdominal: Soft. Bowel sounds are normal. He exhibits no distension. There is no tenderness.  Genitourinary: Rectum normal, prostate normal and penis normal. No penile tenderness.       Prostate not enlarged or nodular  Lymphadenopathy:    He has no cervical adenopathy.  Skin: Skin is warm and dry. No rash noted.  Psychiatric: He has a normal mood and affect.          Assessment & Plan:

## 2010-07-02 NOTE — Assessment & Plan Note (Signed)
Advised to quit.  

## 2010-07-05 ENCOUNTER — Telehealth: Payer: Self-pay | Admitting: Family Medicine

## 2010-07-05 ENCOUNTER — Encounter: Payer: Self-pay | Admitting: Family Medicine

## 2010-07-05 NOTE — Telephone Encounter (Signed)
Discussed with patient his lab results.  Also checked on his hernia.  He is still having pain.  He doesn't want a referral to a surgeon yet but he will call us and let us know when he wants to proceed.

## 2010-09-17 ENCOUNTER — Ambulatory Visit (INDEPENDENT_AMBULATORY_CARE_PROVIDER_SITE_OTHER): Payer: Self-pay | Admitting: Family Medicine

## 2010-09-17 ENCOUNTER — Encounter: Payer: Self-pay | Admitting: Family Medicine

## 2010-09-17 DIAGNOSIS — R369 Urethral discharge, unspecified: Secondary | ICD-10-CM

## 2010-09-17 DIAGNOSIS — R300 Dysuria: Secondary | ICD-10-CM

## 2010-09-17 DIAGNOSIS — A64 Unspecified sexually transmitted disease: Secondary | ICD-10-CM

## 2010-09-17 DIAGNOSIS — Z23 Encounter for immunization: Secondary | ICD-10-CM

## 2010-09-17 MED ORDER — CEFTRIAXONE SODIUM 250 MG IJ SOLR: 250.0000 mg | Freq: Once | INTRAMUSCULAR | Status: DC

## 2010-09-17 MED ORDER — AZITHROMYCIN 1 G PO PACK: 1.0000 | PACK | Freq: Once | ORAL | Status: DC

## 2010-09-17 MED ORDER — CEFTRIAXONE SODIUM 250 MG IJ SOLR
250.0000 mg | Freq: Once | INTRAMUSCULAR | Status: AC
  Administered 2010-09-17: 250 mg via INTRAMUSCULAR

## 2010-09-17 MED ORDER — AZITHROMYCIN 1 G PO PACK
1.0000 g | PACK | Freq: Once | ORAL | Status: AC
  Administered 2010-09-17: 1 g via ORAL

## 2010-09-17 NOTE — Progress Notes (Signed)
  Subjective:    Patient ID: Roger Mendoza, male    DOB: Jul 25, 1960, 50 y.o.   MRN: [id number]  HPI 1. Penile Discharge Patient has had one partner for a year. She has given him Trichomonas in the past. She has been alerted to get treatment. He has white discharge and dysuria, as well as testicular pain. He has no other sexual partners.   Review of Systems  Constitutional: Negative for fever and chills.  Cardiovascular: Negative for leg swelling.  Genitourinary: Positive for dysuria, discharge, penile pain and testicular pain. Negative for frequency, hematuria and genital sores.  Musculoskeletal: Negative for arthralgias.  Skin: Negative for rash.       Objective:   Physical Exam  Nursing note and vitals reviewed. Abdominal: A hernia is present. Hernia confirmed positive in the right inguinal area. Hernia confirmed negative in the left inguinal area.  Genitourinary: Testes normal. Right testis shows no swelling and no tenderness. Left testis shows no swelling and no tenderness. Circumcised. Discharge found.  Lymphadenopathy:       Right: No inguinal adenopathy present.       Left: No inguinal adenopathy present.      Assessment & Plan:  1. STD GC/Cl RPR HIV negative from outside lab per pt.  1Gram of Azithromycin given 250 mg IM of Rocephin given  Please have partner tested/treated

## 2010-09-18 LAB — RPR

## 2010-09-20 ENCOUNTER — Telehealth: Payer: Self-pay | Admitting: Family Medicine

## 2010-09-20 NOTE — Telephone Encounter (Signed)
Called to give URINE gc/CL results - patient not there. Will call again later

## 2010-10-07 ENCOUNTER — Ambulatory Visit (INDEPENDENT_AMBULATORY_CARE_PROVIDER_SITE_OTHER): Payer: Self-pay | Admitting: Family Medicine

## 2010-10-07 ENCOUNTER — Encounter: Payer: Self-pay | Admitting: Family Medicine

## 2010-10-07 VITALS — BP 111/64 | HR 83 | Wt 242.0 lb

## 2010-10-07 DIAGNOSIS — M549 Dorsalgia, unspecified: Secondary | ICD-10-CM

## 2010-10-07 MED ORDER — OXYCODONE-ACETAMINOPHEN 5-325 MG PO TABS: 1.0000 | ORAL_TABLET | Freq: Three times a day (TID) | ORAL | Status: AC | PRN

## 2010-10-07 MED ORDER — OXYCODONE-ACETAMINOPHEN 5-325 MG PO TABS: 1.0000 | ORAL_TABLET | Freq: Three times a day (TID) | ORAL | Status: DC | PRN

## 2010-10-07 MED ORDER — PREDNISONE 50 MG PO TABS: 50.0000 mg | ORAL_TABLET | Freq: Every day | ORAL | Status: DC

## 2010-10-07 MED ORDER — MELOXICAM 15 MG PO TABS
15.0000 mg | ORAL_TABLET | Freq: Every day | ORAL | Status: DC
Start: 2010-10-07 — End: 2010-10-14

## 2010-10-07 MED ORDER — KETOROLAC TROMETHAMINE 30 MG/ML IJ SOLN
30.0000 mg | Freq: Once | INTRAMUSCULAR | Status: AC
  Administered 2010-10-07: 60 mg via INTRAMUSCULAR

## 2010-10-07 MED ORDER — KETOROLAC TROMETHAMINE 60 MG/2ML IM SOLN: 60.0000 mg | Freq: Once | INTRAMUSCULAR | Status: DC

## 2010-10-07 NOTE — Progress Notes (Signed)
  Subjective:    Patient ID: Roger Mendoza, male    DOB: 10/25/1960, 50 y.o.   MRN: [id number]  HPI 50 year old male coming in with back pain. Patient states that it is in the lumbar region started approximately 2 weeks ago and has increased. Patient gives history of having an herniated disc many years ago. Patient states that he does not remember a single incident that caused the pain to come on but now is constant is waking him up at night does have radiation down both legs more on the left leg posterior and does go past the knee. Patient denies leg ever feeling weak or giving out on him denies any type of numbness and denies any bowel or bladder problems. Patient also denies any type of fevers or chills or abdominal pain out of the ordinary. Patient has had a history of a cervical herniated disc and states this does have the same nerve pain that presents with that.   Review of Systems See history of present illness Past medical history, social, surgical and family history all reviewed.      Objective:   Physical Exam Back Exam: Inspection: no gross deformity. No scoliosis patient does have a little increase in lumbar lordosis the Motion: Patient is decreased in all range of motion is actively. SLR seated:   Positive on left                       SLR lying: Positive on the left and contralateral XSLR seated:     Positive                   XSLR lying: Positive Seated HS Flexibility: Patient is tight on hamstrings bilaterally Palpable tenderness: Patient states he has tenderness on both paraspinal sides of the back as well as some spinous process tenderness over L4. FABER: Unable to do Sensory change: Neurovascularly intact distally. Reflex change: Deep tendon reflexes are 2+ bilaterally  Strength at foot Plantar-flexion:  5/ 5    Dorsi-flexion: 5 / 5    Eversion: 5 / 5   Inversion: 5 / 5 Leg strength Quad: 5 / 5   Hamstring:  5/ 5   Hip flexor:  5/ 5   Hip abductors: 4 / 5  bilaterally Gait Walking:  Walks hunched over does not to straighten           Assessment & Plan:

## 2010-10-07 NOTE — Patient Instructions (Signed)
We will get x-rays I will call you with the results I want you to take mobic daily Take percocet when you need it I want to see you back in 1 week.

## 2010-10-07 NOTE — Assessment & Plan Note (Signed)
Patient has back pain that shows a little bit of possibility of a repeat injured herniate disc. Patient does have some radiculopathy going down mostly the left leg and has a positive straight leg test on the left. Of one concern though patient does have pain of the spinous process of L4 so we will get x-rays to rule out any type of compression fraction or spondylolysis. Will treat as a herniated disc a given a shot of portal today we'll do prednisone x5 days and give Percocet for any type of breakthrough pain. Patient is given a note for work to stay out for the next week and then we'll have him come back for reevaluation to make sure he is doing well. Patient given instructions to do ice and heat if wanted. Patient will followup in one week and we do we'll consider starting some back exercise program.

## 2010-10-14 ENCOUNTER — Encounter: Payer: Self-pay | Admitting: Family Medicine

## 2010-10-14 ENCOUNTER — Ambulatory Visit (INDEPENDENT_AMBULATORY_CARE_PROVIDER_SITE_OTHER): Payer: Self-pay | Admitting: Family Medicine

## 2010-10-14 VITALS — BP 114/64 | HR 75 | Temp 97.9°F | Wt 246.9 lb

## 2010-10-14 DIAGNOSIS — M549 Dorsalgia, unspecified: Secondary | ICD-10-CM

## 2010-10-14 MED ORDER — MELOXICAM 15 MG PO TABS: 15.0000 mg | ORAL_TABLET | Freq: Every day | ORAL | Status: DC

## 2010-10-14 MED ORDER — KETOROLAC TROMETHAMINE 60 MG/2ML IM SOLN
60.0000 mg | Freq: Once | INTRAMUSCULAR | Status: AC
  Administered 2010-10-14: 60 mg via INTRAMUSCULAR

## 2010-10-14 NOTE — Progress Notes (Signed)
  Subjective:    Patient ID: Roger Mendoza, male    DOB: 08/30/1960, 50 y.o.   MRN: [id number]  HPI  Patient is coming back for followup on back pain. Patient has a history of having a herniated disc on the left side and did have radicular symptoms last time I saw him last week. Patient states since then he has been getting a little bit better. Patient states the pain went from a 10 out of 10 to approximately 6/10 he was unable to get his meloxicam filled but he has been taking the Percocet as needed. Patient has brought in the vial and hasn't plenty of medication at this time. Patient states he has had some radicular symptoms still going to the left leg past the knee but not to his foot but this has become much more intermittent than before and states he is not having any nighttime awakenings anymore. Patient denies any type of bowel or bladder problems or any type of urinary hesitancy. Patient also denies any type of recent weight loss.  Review of Systems All negative otherwise as stated in history of present illness     Objective:   Physical Exam Gen: NAD Back Exam: Inspection: no gross deformity. No scoliosis patient does have a little increase in lumbar lordosis the Motion: Patient is decreased in all range of motion is actively. SLR seated:   Positive on left                       SLR lying: Positive on the left and contralateral XSLR seated:     Positive                   XSLR lying: Positive Seated HS Flexibility: Patient is tight on hamstrings bilaterally Palpable tenderness: Decrease in spasm of paraspinal muscles compared to last visit and no spinous process tenderness on exam. FABER: Unable to do due to tight hamstrings and pain. Sensory change: Neurovascularly intact distally. Reflex change: Deep tendon reflexes are 2+ bilaterally  Strength at foot Plantar-flexion:  5/ 5    Dorsi-flexion: 5 / 5    Eversion: 5 / 5   Inversion: 5 / 5 Leg strength Quad: 5 / 5   Hamstring:  5/  5   Hip flexor:  5/ 5   Hip abductors: 4 / 5 bilaterally Gait Walking:  Walks with straighter posture than last time.     Assessment & Plan:

## 2010-10-14 NOTE — Patient Instructions (Signed)
Back Exercises   These exercises may help you when beginning to rehabilitate your injury. Your symptoms may resolve with or without further involvement from your physician, physical therapist or athletic trainer. While completing these exercises, remember:   Restoring tissue flexibility helps normal motion to return to the joints. This allows healthier, less painful movement and activity.  An effective stretch should be held for at least 30 seconds.  A stretch should never be painful. You should only feel a gentle lengthening or release in the stretched tissue.   STRETCH - Extension, Prone on Elbows   Lie on your stomach on the floor, a bed will be too soft. Place your palms about shoulder width apart and at the height of your head.  Place your elbows under your shoulders. If this is too painful, stack pillows under your chest.  Allow your body to relax so that your hips drop lower and make contact more completely with the floor.  Hold this position for _____5_____ seconds.  Slowly return to lying flat on the floor. Repeat ____12______ times. Complete this exercise ______2____ times per day.      RANGE OF MOTION - Extension, Prone Press Ups   Lie on your stomach on the floor, a bed will be too soft. Place your palms about shoulder width apart and at the height of your head.  Keeping your back as relaxed as possible, slowly straighten your elbows while keeping your hips on the floor. You may adjust the placement of your hands to maximize your comfort. As you gain motion, your hands will come more underneath your shoulders.  Hold this position ____5______ seconds.  Slowly return to lying flat on the floor. Repeat ___12_______ times. Complete this exercise ______2____ times per day.        RANGE OF MOTION- Quadruped, Neutral Spine   Assume a hands and knees position on a firm surface. Keep your hands under your shoulders and your knees under your hips. You may place padding under  your knees for comfort.    Drop your head and point your tail bone toward the ground below you.  This will round out your low back like an angry cat. Hold this position for __________ seconds.   Slowly lift your head and release your tail bone so that your back sags into a large arch, like an old horse.  Hold this position for _____5_____ seconds.   Repeat this until you feel limber in your low back.  Now, find your "sweet spot." This will be the most comfortable position somewhere between the two previous positions. This is your neutral spine. Once you have found this position, tense your stomach muscles to support your low back.  Hold this position for ______5____ seconds. Repeat _____12_____ times.  Complete this exercise _____2_____ times per day.      STRETCH - Flexion, Single Knee to Chest   Lie on a firm bed or floor with both legs extended in front of you.  Keeping one leg in contact with the floor, bring your opposite knee to your chest. Hold your leg in place by either grabbing behind your thigh or at your knee.  Pull until you feel a gentle stretch in your low back. Hold _____5_____ seconds.  Slowly release your grasp and repeat the exercise with the opposite side. Repeat ______12____ times. Complete this exercise _____2_____ times per day.     STRETCH - Hamstrings, Standing  Stand or sit and extend your __________ leg, placing your foot on  a chair or foot stool  Keeping a slight arch in your low back and your hips straight forward.    Lead with your chest and lean forward at the waist until you feel a gentle stretch in the back of your __________ knee or thigh. (When done correctly, this exercise requires leaning only a small distance.)  Hold this position for ____5______ seconds. Repeat _____12_____ times. Complete this stretch _____2_____ times per day.     STRENGTHENING - Deep Abdominals, Pelvic Tilt   Lie on a firm bed or floor. Keeping your legs in front of  you, bend your knees so they are both pointed toward the ceiling and your feet are flat on the floor.  Tense your lower abdominal muscles to press your low back into the floor. This motion will rotate your pelvis so that your tail bone is scooping upwards rather than pointing at your feet or into the floor.  With a gentle tension and even breathing, hold this position for __________ seconds. Repeat __________ times. Complete this exercise __________ times per day.     STRENGTHENING - Abdominals, Crunches   Lie on a firm bed or floor. Keeping your legs in front of you, bend your knees so they are both pointed toward the ceiling and your feet are flat on the floor. Cross your arms over your chest.    Slightly tip your chin down without bending your neck.  Tense your abdominals and slowly lift your trunk high enough to just clear your shoulder blades. Lifting higher can put excessive stress on the low back and does not further strengthen your abdominal muscles.  Control your return to the starting position. Repeat __________ times. Complete this exercise __________ times per day.      STRENGTHENING - Quadruped, Opposite UE/LE Lift   Assume a hands and knees position on a firm surface. Keep your hands under your shoulders and your knees under your hips.  You may place padding under your knees for comfort.    Find your neutral spine and gently tense your abdominal muscles so that you can maintain this position. Your shoulders and hips should form a rectangle that is parallel with the floor and is not twisted.   Keeping your trunk steady, lift your right hand no higher than your shoulder and then your left leg no higher than your hip. Make sure you are not holding your breath. Hold this position __________ seconds.  Continuing to keep your abdominal muscles tense and your back steady, slowly return to your starting position. Repeat with the opposite arm and leg. Repeat __________ times.  Complete this exercise __________ times per day.   Document Released: 01/28/2005  Document Re-Released: 12/29/2008 [facility] Patient Information 2011 [place], [place].

## 2010-10-14 NOTE — Assessment & Plan Note (Signed)
Awaiting patient's x-ray at this time. Patient appears to be doing somewhat better and do think this is likely related to a herniated disc. Do not feel any more imaging at this time is necessary today we will give him another shot of Toradol since he did not get a meloxicam filled. Then we'll have him start the meloxicam told him to do that no other anti-inflammatories. Patient is to take Percocet only as needed for breakthrough pain. Patient given a note to stat work until Monday of next week. Patient given exercises and shown examples of what to do with them. He will do these at home twice a day for the next 2 weeks and come back for reevaluation at that time if still not doing much better we will consider doing formal physical therapy with TENS unit as well as back school. Patient did bring up the idea of disability and I told him that at doctor and I'm here to get him better at this time patient agreed that he would like to improve his quality of life

## 2010-10-28 ENCOUNTER — Ambulatory Visit: Payer: Self-pay | Admitting: Family Medicine

## 2010-11-02 ENCOUNTER — Ambulatory Visit (INDEPENDENT_AMBULATORY_CARE_PROVIDER_SITE_OTHER): Payer: Self-pay | Admitting: Family Medicine

## 2010-11-02 ENCOUNTER — Encounter: Payer: Self-pay | Admitting: Family Medicine

## 2010-11-02 VITALS — BP 111/63 | HR 79 | Temp 98.5°F | Ht 73.0 in | Wt 249.4 lb

## 2010-11-02 DIAGNOSIS — M9981 Other biomechanical lesions of cervical region: Secondary | ICD-10-CM

## 2010-11-02 DIAGNOSIS — M549 Dorsalgia, unspecified: Secondary | ICD-10-CM

## 2010-11-02 DIAGNOSIS — M999 Biomechanical lesion, unspecified: Secondary | ICD-10-CM | POA: Insufficient documentation

## 2010-11-02 DIAGNOSIS — Z0000 Encounter for general adult medical examination without abnormal findings: Secondary | ICD-10-CM

## 2010-11-02 MED ORDER — MELOXICAM 15 MG PO TABS: 15.0000 mg | ORAL_TABLET | Freq: Every day | ORAL | Status: DC

## 2010-11-02 NOTE — Assessment & Plan Note (Signed)
Patient improved at this time with home exercises we'll continue home exercises as well as meloxicam as needed will not refill his Percocet, if patient has another exacerbation I would consider doing physical therapy with TENS unit and have patient get the x-ray if he does have radicular symptoms patient is able to work without any restrictions followup as needed

## 2010-11-02 NOTE — Progress Notes (Signed)
  Subjective:    Patient ID: Roger Mendoza, male    DOB: [birth year], 50 y.o.   MRN: [id number]  HPI 50 year old male coming back in with followup on back pain. Patient states that he is doing much better at this time states that his pain is a 0/10 still little tight but otherwise able to do all his activities of daily living without any trouble. Patient was unable to take meloxicam and did not fill it but has been taking ibuprofen and states that it might have helped. Patient also never went to get x-rays of his back pain. Patient denies any other radiation down the legs and denies any bowel or bladder problems denies fever or any weight loss.  Preventive care: Patient is almost 50 years old and has not had a colonoscopy would like to, no family history of colon cancer   Review of Systems As above   past medical surgical and social history reviewed as well as family history with no changes Objective:   Physical Exam General: No acute distress alert and oriented x3 Back exam patient though has a negative straight leg test bilaterally neurovascularly intact distally deep tendon reflexes are 2+ bilaterally in lower extremities.  Patient able to ambulate without any pain good flexion with mild decreased active range of motion and side bending as well as extension.  OMT Findings: Cervical:C3 rotated and side bent left Thoracic T5 rotated and side bent right Lumbar: L2 rotated and side bent right Sacrum:neutral     Assessment & Plan:

## 2010-11-02 NOTE — Assessment & Plan Note (Signed)
Patient is 50 years old only has the [facility] card we will send in referral from on access to get patient's colonoscopy.

## 2010-11-02 NOTE — Assessment & Plan Note (Signed)
After verbal consent pt did have HVLA, with marked improvement.  Gave side effects to look out for and can take anti inflammatories in the acute time frame.   

## 2010-11-24 ENCOUNTER — Encounter: Payer: Self-pay | Admitting: Family Medicine

## 2010-11-24 NOTE — Progress Notes (Signed)
  Subjective:    Patient ID: Roger Mendoza, male    DOB: July 29, 1960, 50 y.o.   MRN: [id number]  HPI  Attempted to give patient's a GI referral for colonoscopy. Unfortunately patient is Roger Mendoza and there are no providers at this time. We'll need to cancel his referral at this time and monitor with close followup.  Review of Systems     Objective:   Physical Exam        Assessment & Plan:

## 2010-12-06 ENCOUNTER — Ambulatory Visit (INDEPENDENT_AMBULATORY_CARE_PROVIDER_SITE_OTHER): Payer: Self-pay | Admitting: Family Medicine

## 2010-12-06 ENCOUNTER — Encounter: Payer: Self-pay | Admitting: Family Medicine

## 2010-12-06 VITALS — BP 95/68 | HR 80 | Temp 98.0°F | Ht 73.0 in | Wt 253.2 lb

## 2010-12-06 DIAGNOSIS — B353 Tinea pedis: Secondary | ICD-10-CM

## 2010-12-06 MED ORDER — CLOTRIMAZOLE 1 % EX CREA: TOPICAL_CREAM | Freq: Two times a day (BID) | CUTANEOUS | Status: DC

## 2010-12-06 NOTE — Patient Instructions (Signed)
Athlete's Foot Athlete's foot (tinea pedis) is an infection of the skin on the feet. It often occurs on the skin between or underneath the toes, but also occurs on the soles of the feet. Athlete's foot is more likely to occur in hot, humid weather. It may occur in anyone, but not washing feet or changing socks frequently enough contributes to this. It may be spread to other people by sharing towels or shower stalls. CAUSES Athlete's foot is caused by a fungus (not a worm, despite the nickname ringworm). This is a very small (microscopic) germ that thrives in warm, moist environments such as sweaty feet and other parts of the body. The fungus usually spreads from 1 person to another. For example, it may spread from sharing shower stalls, sharing towels, and wet floors. People with weakened immune systems, including people with diabetes, may be more prone to athlete's foot. SYMPTOMS   Itchy areas between the toes or on the soles of your feet.   White, flakey, or scaly areas between the toes or on the soles of the feet.   Tiny, intensely itchy blisters between the toes or on the soles of the feet.   The skin can develop tiny cuts. These cuts can develop further into an infection from bacteria (another kind of germ).   The adjacent toenails may be thickened or discolored.  Athlete's foot does not usually cause fever or generalized illness. DIAGNOSIS  Your caregiver usually will recognize athlete's foot based on appearance. If the diagnosis is uncertain, then the rash area can be scraped. Then, the small particles of the skin can be examined more closely. This could include your caregiver examining the specimen under a microscope. It might involve sending the specimen to a lab to see if the fungus can be grown. This could take several weeks to get a result. TREATMENT  The first step in treatment is prevention:  Do not share towels.   Wear sandals or other foot protection in wet areas such as locker  rooms, shared showers, and public swimming pools.   Keep your feet dry. Wear shoes that allow air to circulate and use cotton or wool socks.  Many products are available to treat athlete's foot. Most are medications that kill fungus. They are available as powders or creams. Some require a prescription, some do not. Your caregiver or pharmacist can make a suggestion. Do not use steroid creams on athlete's foot. HOME CARE INSTRUCTIONS   Use medications as prescribed.   Keep your feet clean and cool with daily washes. Dry them well, especially between your toes.   Change your socks every day. Use cotton or wool socks. It is helpful to go barefoot. If you go barefoot, even in the household, be careful not to cause other injuries. (Stepping on toothpicks and needles can require Emergency Department visits!) In hot climates, it may be necessary to change socks 2 to 3 times per day. Wear sandals or canvas tennis shoes during treatment.   If you have blisters, soak your feet in Burrow's or an Epsom salt solution for 20 to 30 minutes, 2 times a day to dry out the blisters. Be sure to thoroughly pat dry, or air dry, your feet.   To keep infection from returning, keep wearing cotton or wool socks and dry your feet well after washing. Wipe between your toes at bedtime.   Fungal infections are hard to get rid of and respond slowly. Continue the treatment even if your athlete's foot does  not seem to be getting better. Treatment may be needed for several weeks.  SEEK MEDICAL CARE IF:   You develop a temperature with no other apparent cause.   You develop swelling, soreness, or redness (inflammation) in your foot.   The infection is spreading. Your foot becomes swollen, hot, or red (inflamed).   Treatment is not helping.   Athlete's foot is not better in 7 days or completely cured in 30 days.   You have any problems that may be related to the medicine you are taking.  MAKE SURE YOU:   Understand  these instructions.   Will watch your condition.   Will get help right away if you are not doing well or get worse.  Document Released: 01/08/2000 Document Revised: 09/22/2010 Document Reviewed: 08/16/2007 [facility] Patient Information 2012 [place], [place].

## 2010-12-06 NOTE — Assessment & Plan Note (Signed)
Exam and history consistent with tinea pedis. Will start pt on lotrimin for this. Handout also given.

## 2010-12-06 NOTE — Progress Notes (Signed)
  Subjective:    Patient ID: Roger Mendoza, male    DOB: 05/04/1960, 50 y.o.   MRN: [id number]  HPI L foot irritation x 2 weeks. Pt states that he has not had sxs like this before in the past. No LE swelling, numbness, pain. No hx/o gout.  No pain with weight bearing/ambulation. Area mainly involves medial aspect of L foot. Area is mainly itchy. Pt states that he wears socks every day.    Review of Systems See HPI, otherwise 12 point ROS negative.     Objective:   Physical Exam  Skin:          3x3 cm patch lichenification, flaking, and scaling.    Gen: in bed, NAD EXT:        Assessment & Plan:

## 2011-01-13 ENCOUNTER — Ambulatory Visit: Payer: Self-pay | Admitting: Family Medicine

## 2011-01-28 ENCOUNTER — Encounter: Payer: Self-pay | Admitting: Family Medicine

## 2011-01-28 ENCOUNTER — Ambulatory Visit (INDEPENDENT_AMBULATORY_CARE_PROVIDER_SITE_OTHER): Payer: Self-pay | Admitting: Family Medicine

## 2011-01-28 VITALS — BP 131/74 | HR 85 | Temp 98.0°F | Ht 73.0 in | Wt 265.0 lb

## 2011-01-28 DIAGNOSIS — N50811 Right testicular pain: Secondary | ICD-10-CM

## 2011-01-28 DIAGNOSIS — N509 Disorder of male genital organs, unspecified: Secondary | ICD-10-CM

## 2011-01-28 DIAGNOSIS — N453 Epididymo-orchitis: Secondary | ICD-10-CM

## 2011-01-28 DIAGNOSIS — M549 Dorsalgia, unspecified: Secondary | ICD-10-CM

## 2011-01-28 DIAGNOSIS — N451 Epididymitis: Secondary | ICD-10-CM

## 2011-01-28 LAB — POCT URINALYSIS DIPSTICK
Nitrite, UA: NEGATIVE
Protein, UA: NEGATIVE
Spec Grav, UA: 1.015
Urobilinogen, UA: 0.2

## 2011-01-28 MED ORDER — MELOXICAM 15 MG PO TABS: 15.0000 mg | ORAL_TABLET | Freq: Every day | ORAL | Status: DC

## 2011-01-28 MED ORDER — LEVOFLOXACIN 500 MG PO TABS: 500.0000 mg | ORAL_TABLET | Freq: Every day | ORAL | Status: AC

## 2011-01-28 NOTE — Patient Instructions (Signed)
It was nice to meet you today!  If you are not feeling better in 2 weeks, please come back to see [place].  If you have any fevers, increased pain, or difficulty urinating, please call the office.  Take care! Let me know if you need anything at all. Peggyann Zwiefelhofer M. Jaeleah Smyser, M.D.

## 2011-01-29 ENCOUNTER — Encounter: Payer: Self-pay | Admitting: Family Medicine

## 2011-01-29 DIAGNOSIS — N451 Epididymitis: Secondary | ICD-10-CM | POA: Insufficient documentation

## 2011-01-29 LAB — GC/CHLAMYDIA PROBE AMP, URINE
Chlamydia, Swab/Urine, PCR: NEGATIVE
GC Probe Amp, Urine: NEGATIVE
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
## 2011-01-30 NOTE — Assessment & Plan Note (Signed)
Given the location of pain, most likely epididymitis. Patient given Rx for Levaquin x 10 days. If he does not improve, patient is to return. He does not have a hernia on my exam, and he has no evidence of BPH. Checked UA, GC/Ch today which were negative. Encouraged patient to continue to use NSAIDs for pain. Told of red flag symptoms including fever, increased pain, asymmetry, redness, swelling and he should come to our office or go to urgent care for immediate evaluation.

## 2011-02-07 ENCOUNTER — Telehealth: Payer: Self-pay | Admitting: Family Medicine

## 2011-02-07 NOTE — Telephone Encounter (Signed)
Please call Mr. Law for results of last labs

## 2011-02-08 NOTE — Telephone Encounter (Signed)
Patient informed of negative results, expressed understanding. 

## 2011-11-24 ENCOUNTER — Ambulatory Visit (INDEPENDENT_AMBULATORY_CARE_PROVIDER_SITE_OTHER): Payer: Self-pay | Admitting: Family Medicine

## 2011-11-24 ENCOUNTER — Encounter: Payer: Self-pay | Admitting: Family Medicine

## 2011-11-24 VITALS — BP 114/68 | HR 89 | Temp 98.6°F | Ht 73.0 in | Wt 264.0 lb

## 2011-11-24 DIAGNOSIS — N390 Urinary tract infection, site not specified: Secondary | ICD-10-CM

## 2011-11-24 DIAGNOSIS — N12 Tubulo-interstitial nephritis, not specified as acute or chronic: Secondary | ICD-10-CM

## 2011-11-24 DIAGNOSIS — R319 Hematuria, unspecified: Secondary | ICD-10-CM

## 2011-11-24 LAB — POCT URINALYSIS DIPSTICK
Ketones, UA: NEGATIVE
Protein, UA: 30
Urobilinogen, UA: 2
pH, UA: 6.5

## 2011-11-24 LAB — POCT UA - MICROSCOPIC ONLY

## 2011-11-24 MED ORDER — SULFAMETHOXAZOLE-TRIMETHOPRIM 800-160 MG PO TABS: 1.0000 | ORAL_TABLET | Freq: Two times a day (BID) | ORAL | Status: DC

## 2011-11-24 NOTE — Assessment & Plan Note (Signed)
Unclear etiology.  Will treat and culture urine.  Return in 2 wks for repeat u/a.  Has smoking hx and may need urology referral for further w/u.

## 2011-11-24 NOTE — Patient Instructions (Addendum)
Urinary Tract Infection  Urinary tract infections (UTIs) can develop anywhere along your urinary tract. Your urinary tract is your body's drainage system for removing wastes and extra water. Your urinary tract includes two kidneys, two ureters, a bladder, and a urethra. Your kidneys are a pair of bean-shaped organs. Each kidney is about the size of your fist. They are located below your ribs, one on each side of your spine.  CAUSES  Infections are caused by microbes, which are microscopic organisms, including fungi, viruses, and bacteria. These organisms are so small that they can only be seen through a microscope. Bacteria are the microbes that most commonly cause UTIs.  SYMPTOMS   Symptoms of UTIs may vary by age and gender of the patient and by the location of the infection. Symptoms in young women typically include a frequent and intense urge to urinate and a painful, burning feeling in the bladder or urethra during urination. Older women and men are more likely to be tired, shaky, and weak and have muscle aches and abdominal pain. A fever may mean the infection is in your kidneys. Other symptoms of a kidney infection include pain in your back or sides below the ribs, nausea, and vomiting.  DIAGNOSIS  To diagnose a UTI, your caregiver will ask you about your symptoms. Your caregiver also will ask to provide a urine sample. The urine sample will be tested for bacteria and white blood cells. White blood cells are made by your body to help fight infection.  TREATMENT   Typically, UTIs can be treated with medication. Because most UTIs are caused by a bacterial infection, they usually can be treated with the use of antibiotics. The choice of antibiotic and length of treatment depend on your symptoms and the type of bacteria causing your infection.  HOME CARE INSTRUCTIONS   If you were prescribed antibiotics, take them exactly as your caregiver instructs you. Finish the medication even if you feel better after you  have only taken some of the medication.   Drink enough water and fluids to keep your urine clear or pale yellow.   Avoid caffeine, tea, and carbonated beverages. They tend to irritate your bladder.   Empty your bladder often. Avoid holding urine for long periods of time.   Empty your bladder before and after sexual intercourse.   After a bowel movement, women should cleanse from front to back. Use each tissue only once.  SEEK MEDICAL CARE IF:    You have back pain.   You develop a fever.   Your symptoms do not begin to resolve within 3 days.  SEEK IMMEDIATE MEDICAL CARE IF:    You have severe back pain or lower abdominal pain.   You develop chills.   You have nausea or vomiting.   You have continued burning or discomfort with urination.  MAKE SURE YOU:    Understand these instructions.   Will watch your condition.   Will get help right away if you are not doing well or get worse.  Document Released: 10/20/2004 Document Revised: 07/12/2011 Document Reviewed: 02/18/2011  [employer] Patient Information 2013 [employer]    Pyelonephritis, Adult  Pyelonephritis is a kidney infection. In general, there are 2 main types of pyelonephritis:   Infections that come on quickly without any warning (acute pyelonephritis).   Infections that persist for a long period of time (chronic pyelonephritis).  CAUSES   Two main causes of pyelonephritis are:   Bacteria traveling from the bladder to the kidney.   This is a problem especially in pregnant women. The urine in the bladder can become filled with bacteria from multiple causes, including:   Inflammation of the prostate gland (prostatitis).   Sexual intercourse in females.   Bladder infection (cystitis).   Bacteria traveling from the bloodstream to the tissue part of the kidney.  Problems that may increase your risk of getting a kidney infection include:   Diabetes.   Kidney stones or bladder stones.   Cancer.   Catheters placed in the bladder.   Other  abnormalities of the kidney or ureter.  SYMPTOMS    Abdominal pain.   Pain in the side or flank area.   Fever.   Chills.   Upset stomach.   Blood in the urine (dark urine).   Frequent urination.   Strong or persistent urge to urinate.   Burning or stinging when urinating.  DIAGNOSIS   Your caregiver may diagnose your kidney infection based on your symptoms. A urine sample may also be taken.  TREATMENT   In general, treatment depends on how severe the infection is.    If the infection is mild and caught early, your caregiver may treat you with oral antibiotics and send you home.   If the infection is more severe, the bacteria may have gotten into the bloodstream. This will require intravenous (IV) antibiotics and a hospital stay. Symptoms may include:   High fever.   Severe flank pain.   Shaking chills.   Even after a hospital stay, your caregiver may require you to be on oral antibiotics for a period of time.   Other treatments may be required depending upon the cause of the infection.  HOME CARE INSTRUCTIONS    Take your antibiotics as directed. Finish them even if you start to feel better.   Make an appointment to have your urine checked to make sure the infection is gone.   Drink enough fluids to keep your urine clear or pale yellow.   Take medicines for the bladder if you have urgency and frequency of urination as directed by your caregiver.  SEEK IMMEDIATE MEDICAL CARE IF:    You have a fever or persistent symptoms for more than 2-3 days.   You have a fever and your symptoms suddenly get worse.   You are unable to take your antibiotics or fluids.   You develop shaking chills.   You experience extreme weakness or fainting.   There is no improvement after 2 days of treatment.  MAKE SURE YOU:   Understand these instructions.   Will watch your condition.   Will get help right away if you are not doing well or get worse.  Document Released: 01/10/2005 Document Revised: 07/12/2011  Document Reviewed: 06/16/2010  [employer] Patient Information 2013 [employer].

## 2011-11-24 NOTE — Progress Notes (Signed)
  Subjective:    Patient ID: Roger Mendoza, male    DOB: February 02, 1960, 51 y.o.   MRN: [id number]  Hematuria This is a new problem. The current episode started yesterday. The problem has been gradually worsening since onset. He describes the hematuria as gross hematuria. The hematuria occurs during the initial portion of his urinary stream. He reports no clotting in his urine stream. The pain is mild. Irritative symptoms include frequency and urgency. Irritative symptoms do not include nocturia. Obstructive symptoms include incomplete emptying. Obstructive symptoms do not include dribbling, an intermittent stream, a slower stream, straining or a weak stream. Associated symptoms include abdominal pain, dysuria, fever (104) and genital pain. Pertinent negatives include no flank pain, nausea or vomiting. He is not sexually active. There is no history of BPH, GU trauma, hypertension, kidney stones, sickle cell disease or STDs.      Review of Systems  Constitutional: Positive for fever (104).  Gastrointestinal: Positive for abdominal pain. Negative for nausea and vomiting.  Genitourinary: Positive for dysuria, urgency, frequency, hematuria and incomplete emptying. Negative for flank pain and nocturia.       Objective:   Physical Exam  Vitals reviewed. Constitutional: He appears well-developed and well-nourished.  HENT:  Head: Normocephalic and atraumatic.  Neck: Neck supple.  Cardiovascular: Normal rate.   Pulmonary/Chest: Effort normal.  Abdominal: Soft.  Musculoskeletal: Normal range of motion. He exhibits no tenderness.  Neurological: He is alert.  Skin: Skin is warm and dry. No rash noted.  Psychiatric: He has a normal mood and affect. His behavior is normal.          Assessment & Plan:

## 2011-12-15 ENCOUNTER — Ambulatory Visit: Payer: Self-pay | Admitting: Family Medicine

## 2011-12-21 ENCOUNTER — Ambulatory Visit (INDEPENDENT_AMBULATORY_CARE_PROVIDER_SITE_OTHER): Payer: 59 | Admitting: Family Medicine

## 2011-12-21 ENCOUNTER — Encounter: Payer: Self-pay | Admitting: Family Medicine

## 2011-12-21 VITALS — BP 124/76 | HR 74 | Temp 97.6°F | Ht 73.0 in | Wt 261.0 lb

## 2011-12-21 DIAGNOSIS — M549 Dorsalgia, unspecified: Secondary | ICD-10-CM

## 2011-12-21 DIAGNOSIS — R300 Dysuria: Secondary | ICD-10-CM

## 2011-12-21 DIAGNOSIS — M654 Radial styloid tenosynovitis [de Quervain]: Secondary | ICD-10-CM | POA: Insufficient documentation

## 2011-12-21 LAB — POCT URINALYSIS DIPSTICK
Bilirubin, UA: NEGATIVE
Glucose, UA: NEGATIVE
Nitrite, UA: NEGATIVE

## 2011-12-21 LAB — POCT UA - MICROSCOPIC ONLY

## 2011-12-21 MED ORDER — CEPHALEXIN 500 MG PO CAPS: 500.0000 mg | ORAL_CAPSULE | Freq: Three times a day (TID) | ORAL | Status: DC

## 2011-12-21 MED ORDER — MELOXICAM 15 MG PO TABS: 15.0000 mg | ORAL_TABLET | Freq: Every day | ORAL | Status: DC

## 2011-12-21 NOTE — Patient Instructions (Addendum)
De Quervain's Tenosynovitis De Quervain's tenosynovitis involves inflammation of one or two tendon linings (sheaths) or strain of one or two tendons to the thumb: extensor pollicis brevis (EPB), or abductor pollicis longus (APL). This causes pain on the side of the wrist and base of the thumb. Tendon sheaths secrete a fluid that lubricates the tendon, allowing the tendon to move smoothly. When the sheath becomes inflamed, the tendon cannot move freely in the sheath. Both the EPB and APL tendons are important for proper use of the hand. The EPB tendon is important for straightening the thumb. The APL tendon is important for moving the thumb away from the index finger (abducting). The two tendons pass through a small tube (canal) in the wrist, near the base of the thumb. When the tendons become inflamed, pain is usually felt in this area. SYMPTOMS   Pain, tenderness, swelling, warmth, or redness over the base of the thumb and thumb side of the wrist.  Pain that gets worse when straightening the thumb.  Pain that gets worse when moving the thumb away from the index finger, against resistance.  Pain with pinching or gripping.  Locking or catching of the thumb.  Limited motion of the thumb.  Crackling sound (crepitation) when the tendon or thumb is moved or touched.  Fluid-filled cyst in the area of the base of the thumb. CAUSES   Tenosynovitis is often linked with overuse of the wrist.  Tenosynovitis may be caused by repeated injury to the thumb muscle and tendon units, and with repeated motions of the hand and wrist, due to friction of the tendon within the lining (sheath).  Tenosynovitis may also be due to a sudden increase in activity or change in activity. RISK INCREASES WITH:  Sports that involve repeated hand and wrist motions (golf, bowling, tennis, squash, racquetball).  Heavy labor.  Poor physical wrist strength and flexibility.  Failure to warm up properly before practice or  play.  Male gender.  New mothers who hold their baby's head for long periods or lift infants with thumbs in the infant's armpit (axilla). PREVENTION  Warm up and stretch properly before practice or competition.  Allow enough time for rest and recovery between practices and competition.  Maintain appropriate conditioning:  Cardiovascular fitness.  Forearm, wrist, and hand flexibility.  Muscle strength and endurance.  Use proper exercise technique. PROGNOSIS  This condition is usually curable within 6 weeks, if treated properly with non-surgical treatment and resting of the affected area.  RELATED COMPLICATIONS   Longer healing time if not properly treated or if not given enough time to heal.  Chronic inflammation, causing recurring symptoms of tenosynovitis. Permanent pain or restriction of movement.  Risks of surgery: infection, bleeding, injury to nerves (numbness of the thumb), continued pain, incomplete release of the tendon sheath, recurring symptoms, cutting of the tendons, tendons sliding out of position, weakness of the thumb, thumb stiffness. TREATMENT  First, treatment involves the use of medicine and ice, to reduce pain and inflammation. Patients are encouraged to stop or modify activities that aggravate the injury. Stretching and strengthening exercises may be advised. Exercises may be completed at home or with a therapist. You may be fitted with a brace or splint, to limit motion and allow the injury to heal. Your caregiver may also choose to give you a corticosteroid injection, to reduce the pain and inflammation. If non-surgical treatment is not successful, surgery may be needed. Most tenosynovitis surgeries are done as outpatient procedures (you go home the   same day). Surgery may involve local, regional (whole arm), or general anesthesia.  MEDICATION   If pain medicine is needed, nonsteroidal anti-inflammatory medicines (aspirin and ibuprofen), or other minor pain  relievers (acetaminophen), are often advised.  Do not take pain medicine for 7 days before surgery.  Prescription pain relievers are often prescribed only after surgery. Use only as directed and only as much as you need.  Corticosteroid injections may be given if your caregiver thinks they are needed. There is a limited number of times these injections may be given. COLD THERAPY   Cold treatment (icing) should be applied for 10 to 15 minutes every 2 to 3 hours for inflammation and pain, and immediately after activity that aggravates your symptoms. Use ice packs or an ice massage. SEEK MEDICAL CARE IF:   Symptoms get worse or do not improve in 2 to 4 weeks, despite treatment.  You experience pain, numbness, or coldness in the hand.  Blue, gray, or dark color appears in the fingernails.  Any of the following occur after surgery: increased pain, swelling, redness, drainage of fluids, bleeding in the affected area, or signs of infection.  New, unexplained symptoms develop. (Drugs used in treatment may produce side effects.) Document Released: 01/10/2005 Document Revised: 04/04/2011 Document Reviewed: 04/24/2008 [employer] Patient Information 2013 [employer].  

## 2011-12-21 NOTE — Progress Notes (Signed)
  Subjective:    Patient ID: Roger Mendoza, male    DOB: 1960/03/23, 51 y.o.   MRN: [id number]  HPI  Roger Mendoza comes in with a few complaints:   The first is right wrist and thumb pain that has been going on for 3 weeks.  He has had no falls or injuries.  He works in a [employer], occupational.  He says it is a sharp pain that runs from his thumb to his wrist.  He has not noticed any redness, but says it feels a little swollen.   He also complains of some dysuria.  He was treated for Pyelo about one month ago and felt better, but is now having some urinary discomfort again.  He denies fevers or back pain this time.  No nausea/vomiting.    Review of Systems Pertinent items in HPI    Objective:   Physical Exam BP 124/76  Pulse 74  Temp 97.6 F (36.4 C) (Oral)  Ht 6\' 1"  (1.854 m)  Wt 261 lb (118.389 kg)  BMI 34.43 kg/m2 General appearance: alert, cooperative and no distress Wrist: Inspection normal with no visible erythema or swelling. ROM smooth and normal with good flexion and extension and ulnar/radial deviation that is symmetrical with opposite wrist. Palpation is normal over metacarpals, navicular, lunate, and TFCC; tendons without tenderness/ swelling Strength 5/5 in all directions without pain. + Finkelstein, pain over base of thumb and distal radial styloid.  Pain is worsened with thumb extension.   MSK [place]:  There is a large pocket of hypoechoic fluid visualized in 1st compartment of the wrist surrounding the extensor pollicis brevis.    US Guided injection of De Quervain's:  Consent obtained and verified. Sterile betadine prep. Furthur cleansed with alcohol. Topical analgesic spray: Ethyl chloride. Joint: Wrist (1st wrist compartment) Completed without difficulty Meds: 40 mg depo medrol, 1 cc 1% lidocaine Needle: 25 G 1'' Aftercare instructions and Red flags advised.        Assessment & Plan:

## 2011-12-21 NOTE — Assessment & Plan Note (Signed)
Due to overuse at work.  Visualized on [place]. US guided depo medrol injection performed today.  Also will schedule meloxicam x1 week and have advised splint for work.  F/U if does not improve.

## 2011-12-21 NOTE — Addendum Note (Signed)
Addended by: [place], Danette Weinfeld on: 12/21/2011 04:36 PM   Modules accepted: Orders

## 2011-12-21 NOTE — Assessment & Plan Note (Signed)
Will send urine for culture (this was not done last visit). Will treat with Keflex 500 TID x 10 days.

## 2011-12-23 LAB — URINE CULTURE: Colony Count: >=100000

## 2011-12-29 ENCOUNTER — Telehealth: Payer: Self-pay | Admitting: Family Medicine

## 2011-12-29 NOTE — Telephone Encounter (Signed)
Called [name], let him know urine culture did show a bacteria (e. Coli) but the antibiotic he is taking should kill it.  I told him to be sure to take all 10 days worth and let us know if he starts feeling badly again after he finishes the antibiotics.

## 2012-01-04 ENCOUNTER — Ambulatory Visit (INDEPENDENT_AMBULATORY_CARE_PROVIDER_SITE_OTHER): Payer: 59 | Admitting: Family Medicine

## 2012-01-04 ENCOUNTER — Encounter: Payer: Self-pay | Admitting: Family Medicine

## 2012-01-04 VITALS — BP 105/67 | HR 82 | Temp 98.2°F | Ht 73.0 in | Wt 265.0 lb

## 2012-01-04 DIAGNOSIS — Z0000 Encounter for general adult medical examination without abnormal findings: Secondary | ICD-10-CM

## 2012-01-04 DIAGNOSIS — N12 Tubulo-interstitial nephritis, not specified as acute or chronic: Secondary | ICD-10-CM

## 2012-01-04 DIAGNOSIS — M654 Radial styloid tenosynovitis [de Quervain]: Secondary | ICD-10-CM

## 2012-01-04 DIAGNOSIS — N529 Male erectile dysfunction, unspecified: Secondary | ICD-10-CM

## 2012-01-04 LAB — CBC
HCT: 37.6 % — ABNORMAL LOW (ref 39.0–52.0)
MCHC: 33.8 g/dL (ref 30.0–36.0)
MCV: 86.4 fL (ref 78.0–100.0)
Platelets: 359 10*3/uL (ref 150–400)
RDW: 14.1 % (ref 11.5–15.5)
WBC: 10.3 10*3/uL (ref 4.0–10.5)

## 2012-01-04 LAB — COMPREHENSIVE METABOLIC PANEL
ALT: 30 U/L (ref 0–53)
AST: 26 U/L (ref 0–37)
Alkaline Phosphatase: 70 U/L (ref 39–117)
BUN: 8 mg/dL (ref 6–23)
Chloride: 101 mEq/L (ref 96–112)
Creat: 0.99 mg/dL (ref 0.50–1.35)

## 2012-01-04 MED ORDER — SILDENAFIL CITRATE 25 MG PO TABS: 25.0000 mg | ORAL_TABLET | Freq: Every day | ORAL | Status: DC | PRN

## 2012-01-04 NOTE — Assessment & Plan Note (Signed)
Resolved. If recurs, will need further work up including renal ultrasound.

## 2012-01-04 NOTE — Patient Instructions (Signed)
It was good to see you. I am glad you are feeling better.  Please stop by the lab to get your blood drawn. I will call you with any concerning numbers, otherwise I will send you a letter.  I have sent your prescription in. Please let me know in 2 months if you need another appointment for that. If not, I will see you in 6 months for your next check up.  Take care and [place] Christmas! Adellyn Capek M. Atha Muradyan, M.D.

## 2012-01-04 NOTE — Assessment & Plan Note (Signed)
Improved. Continue Meloxicam prn.

## 2012-01-04 NOTE — Assessment & Plan Note (Signed)
Most likely related to anxiety. States he has a new wife. No hypertension or medications with reported side effects. Will try short trial of Viagra to see if that will improve his symptoms. Given red flag symptoms and also told to always tell providers in emergency situations if he has taken it. If this does not improve symptoms, will look for other causes such as back problems.

## 2012-01-04 NOTE — Progress Notes (Signed)
Patient ID: DEVONTE MIGUES, male   DOB: 24-Oct-1960, 51 y.o.   MRN: [id number] [facility] Eufemio Strahm M. Brolin Dambrosia, MD Phone: [phone]   Subjective: HPI: Patient is a 51 y.o. male presenting to clinic today for follow up appointment.  1. UTI- Recently diagnosed with E.coli UTI. He has been on Keflex and overall feels better. No fevers, no chills, no flank pain. No problems urinating. Feels like his infection has cleared up. No history of frequent UTI.  2. Wrist pain- Improved s/p injection. Still taking meloxicam which helps. Good ROM. Some pain on radial side of wrist.  3. Health Maintenance- Last labs were in 2012. Needs screening labs today. Never had colonoscopy, needs to schedule. Declines flu shot. TDap 2012.  4. ED- Reports impotence for unknown period of time. Denies night time erections. Reports some performance anxiety. No fevers, chills, dysuria, pain, discharge, lesions.   History Reviewed: Occasional  smoker.   ROS: Please see HPI above.  Objective: Office vital signs reviewed.  Physical Examination:  General: Awake, alert. NAD HEENT: Atraumatic, normocephalic Neck: No masses palpated. No LAD Pulm: CTAB, no wheezes Cardio: RRR, no murmurs appreciated Abdomen:+BS, soft, nontender, nondistended. No CVA tenderness. Extremities: No edema Neuro: Grossly intact  Assessment: 51 yo M follow up appointment  Plan: See Problem List and After Visit Summary

## 2012-01-04 NOTE — Assessment & Plan Note (Signed)
Will check CBC, CMet and direct LDL today. Given number to call to schedule colonoscopy. Declines flu shot.

## 2012-01-10 ENCOUNTER — Telehealth: Payer: Self-pay | Admitting: Family Medicine

## 2012-01-10 NOTE — Telephone Encounter (Signed)
Abnormal results will forward to MD. Harmonee Tozer Dawn  

## 2012-01-10 NOTE — Telephone Encounter (Signed)
Patient is calling for his lab results from last week

## 2012-01-11 ENCOUNTER — Encounter: Payer: Self-pay | Admitting: Family Medicine

## 2012-01-11 NOTE — Telephone Encounter (Signed)
LMOVM for pt to return call .Fleeger, Jessica Dawn  

## 2012-01-11 NOTE — Telephone Encounter (Signed)
Please let Roger Mendoza know that his hemoglobin is slightly low, but better than it was at last blood draw. Nothing to be concerned about at this time but we will keep an eye on it.  Also, his bad cholesterol is slightly elevated. We had discussed this at his appointment and I would like for him to start thinking about going on a cholesterol medication to protect his heart and arteries. He should try dieting and exercising and follow up with me in 2-3 months.  Thank you! [name] M. Zori Benbrook, M.D.

## 2012-01-31 ENCOUNTER — Encounter: Payer: Self-pay | Admitting: Internal Medicine

## 2012-02-20 ENCOUNTER — Ambulatory Visit (AMBULATORY_SURGERY_CENTER): Payer: 59 | Admitting: *Deleted

## 2012-02-20 VITALS — Ht 73.0 in | Wt 267.0 lb

## 2012-02-20 DIAGNOSIS — Z1211 Encounter for screening for malignant neoplasm of colon: Secondary | ICD-10-CM

## 2012-02-20 MED ORDER — MOVIPREP 100 G PO SOLR: ORAL | Status: DC

## 2012-02-21 ENCOUNTER — Encounter: Payer: Self-pay | Admitting: Internal Medicine

## 2012-02-25 DIAGNOSIS — K635 Polyp of colon: Secondary | ICD-10-CM

## 2012-02-25 HISTORY — DX: Polyp of colon: K63.5

## 2012-02-25 HISTORY — PX: COLONOSCOPY: SHX174

## 2012-03-03 ENCOUNTER — Telehealth: Payer: Self-pay | Admitting: Gastroenterology

## 2012-03-03 NOTE — Telephone Encounter (Signed)
On call note @ 1640. Pt calling to find out if he can eat pizza today. Has colonoscopy Monday. I returned his call promptly and it went to his voicemail. I left a message with the answering service: ok for pizza today and to follow all diet and prep instructions given for colonoscopy.

## 2012-03-05 ENCOUNTER — Ambulatory Visit (AMBULATORY_SURGERY_CENTER): Payer: 59 | Admitting: Internal Medicine

## 2012-03-05 ENCOUNTER — Encounter: Payer: Self-pay | Admitting: Internal Medicine

## 2012-03-05 VITALS — BP 103/50 | HR 73 | Temp 96.9°F | Resp 23

## 2012-03-05 DIAGNOSIS — Z1211 Encounter for screening for malignant neoplasm of colon: Secondary | ICD-10-CM

## 2012-03-05 DIAGNOSIS — D126 Benign neoplasm of colon, unspecified: Secondary | ICD-10-CM

## 2012-03-05 MED ORDER — SODIUM CHLORIDE 0.9 % IV SOLN: 500.0000 mL | INTRAVENOUS | Status: DC

## 2012-03-05 NOTE — Patient Instructions (Addendum)
YOU HAD AN ENDOSCOPIC PROCEDURE TODAY AT THE [facility]: Refer to the procedure report that was given to you for any specific questions about what was found during the examination.  If the procedure report does not answer your questions, please call your gastroenterologist to clarify.  If you requested that your care partner not be given the details of your procedure findings, then the procedure report has been included in a sealed envelope for you to review at your convenience later.  YOU SHOULD EXPECT: Some feelings of bloating in the abdomen. Passage of more gas than usual.  Walking can help get rid of the air that was put into your GI tract during the procedure and reduce the bloating. If you had a lower endoscopy (such as a colonoscopy or flexible sigmoidoscopy) you may notice spotting of blood in your stool or on the toilet paper. If you underwent a bowel prep for your procedure, then you may not have a normal bowel movement for a few days.  DIET: Your first meal following the procedure should be a light meal and then it is ok to progress to your normal diet.  A half-sandwich or bowl of soup is an example of a good first meal.  Heavy or fried foods are harder to digest and may make you feel nauseous or bloated.  Likewise meals heavy in dairy and vegetables can cause extra gas to form and this can also increase the bloating.  Drink plenty of fluids but you should avoid alcoholic beverages for 24 hours.  ACTIVITY: Your care partner should take you home directly after the procedure.  You should plan to take it easy, moving slowly for the rest of the day.  You can resume normal activity the day after the procedure however you should NOT DRIVE or use heavy machinery for 24 hours (because of the sedation medicines used during the test).    SYMPTOMS TO REPORT IMMEDIATELY: A gastroenterologist can be reached at any hour.  During normal business hours, 8:30 AM to 5:00 PM Monday through Friday,  call [phone].  After hours and on weekends, please call the GI answering service at [phone] who will take a message and have the physician on call contact you.   Following lower endoscopy (colonoscopy or flexible sigmoidoscopy):  Excessive amounts of blood in the stool  Significant tenderness or worsening of abdominal pains  Swelling of the abdomen that is new, acute  Fever of 100F or higher     FOLLOW UP: If any biopsies were taken you will be contacted by phone or by letter within the next 1-3 weeks.  Call your gastroenterologist if you have not heard about the biopsies in 3 weeks.  Our staff will call the home number listed on your records the next business day following your procedure to check on you and address any questions or concerns that you may have at that time regarding the information given to you following your procedure. This is a courtesy call and so if there is no answer at the home number and we have not heard from you through the emergency physician on call, we will assume that you have returned to your regular daily activities without incident.  SIGNATURES/CONFIDENTIALITY: You and/or your care partner have signed paperwork which will be entered into your electronic medical record.  These signatures attest to the fact that that the information above on your After Visit Summary has been reviewed and is understood.  Full responsibility of the  confidentiality of this discharge information lies with you and/or your care-partner.   Polyp and hemorrhoid information given.  Dr. Rhea Belton will advise you of timing for next colonoscopy based on pathology reports of polyps.

## 2012-03-05 NOTE — Progress Notes (Signed)
Called to room to assist during endoscopic procedure.  Patient ID and intended procedure confirmed with present staff. Received instructions for my participation in the procedure from the performing physician.  

## 2012-03-05 NOTE — Op Note (Signed)
[facility] [address] [place], [address]   COLONOSCOPY PROCEDURE REPORT  PATIENT: Roger Mendoza, Roger Mendoza  MR#: [id number] BIRTHDATE: 1960/08/30 , 51  yrs. old GENDER: Male ENDOSCOPIST: Beverley Fiedler, MD REFERRED BY: Rodman Pickle, MD PROCEDURE DATE:  03/05/2012 PROCEDURE:   Colonoscopy with snare polypectomy and Colonoscopy with cold biopsy polypectomy ASA CLASS:   Class II INDICATIONS:average risk screening and first colonoscopy. MEDICATIONS: MAC sedation, administered by CRNA and Propofol (Diprivan)  DESCRIPTION OF PROCEDURE:   After the risks benefits and alternatives of the procedure were thoroughly explained, informed consent was obtained.  A digital rectal exam revealed no rectal mass.   The LB CF-[id number]  endoscope was introduced through the anus and advanced to the terminal ileum which was intubated for a short distance. No adverse events experienced.   The quality of the prep was good, using MoviPrep  The instrument was then slowly withdrawn as the colon was fully examined.   COLON FINDINGS: The mucosa appeared normal in the terminal ileum. A sessile polyp measuring 2 mm in size was found in the transverse colon.  A polypectomy was performed with cold forceps.  The resection was complete and the polyp tissue was completely retrieved.   A sessile polyp measuring 5 mm in size was found in the transverse colon.  A polypectomy was performed with a cold snare.  The resection was complete and the polyp tissue was completely retrieved.   The colon mucosa was otherwise normal. Small internal hemorrhoids were found.  Retroflexed views revealed internal hemorrhoids. The time to cecum=2 minutes 45 seconds. Withdrawal time=11 minutes 00 seconds.  The scope was withdrawn and the procedure completed. COMPLICATIONS: There were no complications.  ENDOSCOPIC IMPRESSION: 1.   Normal mucosa in the terminal ileum 2.   Sessile polyp measuring 2 mm in size was  found in the transverse colon; polypectomy was performed with cold forceps 3.   Sessile polyp measuring 5 mm in size was found in the transverse colon; polypectomy was performed with a cold snare 4.   The colon mucosa was otherwise normal 5.   Small internal hemorrhoids  RECOMMENDATIONS: 1.  Await pathology results 2.  If the polyps removed today are proven to be adenomatous (pre-cancerous) polyps, you will need a repeat colonoscopy in 5 years.  Otherwise you should continue to follow colorectal cancer screening guidelines for "routine risk" patients with colonoscopy in 10 years.  You will receive a letter within 1-2 weeks with the results of your biopsy as well as final recommendations.  Please call my office if you have not received a letter after 3 weeks.   eSigned:  Beverley Fiedler, MD 03/05/2012 8:44 AM       cc: The Patient, Rodman Pickle, MD

## 2012-03-05 NOTE — Progress Notes (Signed)
Patient did not experience any of the following events: a burn prior to discharge; a fall within the facility; wrong site/side/patient/procedure/implant event; or a hospital transfer or hospital admission upon discharge from the facility. (G8907) Patient did not have preoperative order for IV antibiotic SSI prophylaxis. (G8918)  

## 2012-03-06 ENCOUNTER — Telehealth: Payer: Self-pay

## 2012-03-06 NOTE — Telephone Encounter (Signed)
  Follow up Call-  Call back number 03/05/2012  Post procedure Call Back phone  # [phone] cell  Permission to leave phone message Yes     Patient questions:  Do you have a fever, pain , or abdominal swelling? no Pain Score  0 *  Have you tolerated food without any problems? yes  Have you been able to return to your normal activities? yes  Do you have any questions about your discharge instructions: Diet   no Medications  no Follow up visit  no  Do you have questions or concerns about your Care? no  Actions: * If pain score is 4 or above: No action needed, pain <4.

## 2012-03-12 ENCOUNTER — Encounter: Payer: Self-pay | Admitting: Internal Medicine

## 2013-05-24 ENCOUNTER — Encounter: Payer: Self-pay | Admitting: Medical

## 2013-05-24 ENCOUNTER — Ambulatory Visit (INDEPENDENT_AMBULATORY_CARE_PROVIDER_SITE_OTHER): Payer: 59 | Admitting: Medical

## 2013-05-24 VITALS — BP 102/70 | HR 68 | Temp 98.4°F | Resp 16 | Wt 266.0 lb

## 2013-05-24 DIAGNOSIS — IMO0002 Reserved for concepts with insufficient information to code with codable children: Secondary | ICD-10-CM

## 2013-05-24 DIAGNOSIS — J329 Chronic sinusitis, unspecified: Secondary | ICD-10-CM

## 2013-05-24 DIAGNOSIS — H6690 Otitis media, unspecified, unspecified ear: Secondary | ICD-10-CM

## 2013-05-24 DIAGNOSIS — L988 Other specified disorders of the skin and subcutaneous tissue: Secondary | ICD-10-CM

## 2013-05-24 DIAGNOSIS — R202 Paresthesia of skin: Secondary | ICD-10-CM

## 2013-05-24 DIAGNOSIS — R209 Unspecified disturbances of skin sensation: Secondary | ICD-10-CM

## 2013-05-24 DIAGNOSIS — J309 Allergic rhinitis, unspecified: Secondary | ICD-10-CM

## 2013-05-24 MED ORDER — AMOXICILLIN 500 MG PO TABS
ORAL_TABLET | ORAL | Status: DC
Start: 2013-05-24 — End: 2014-03-17

## 2013-05-24 NOTE — Progress Notes (Signed)
Subjective:   New patient today  Subjective:  Roger Mendoza is a 53 y.o. male who presents for multiple c/o.  Symptoms include 2wk hx/o right-sided facial pressure and headache, ear pain, feels stopped up on the right side, some sneezing runny nose ear pain, congestion, unable to blow anything out.  Using over-the-counter allergy pill with no improvement  For months now gets occasional tingling in the fingertips, usually involving the first 2 fingers of both hands. Works in [employer], [place], driving a forklift which vibrates.  No history of similar, no neck pain, no shoulder pain, no other weakness numbness or tingling.  Has bilateral heel pain ongoing, wears steel toe shoes, on concrete all day  Has nodule or knot of his left small finger that another doctor told him was arthritis. Wants this looked at. Not bothering him. Not getting bigger  No other aggravating or relieving factors.  No other c/o.  ROS as in subjective   Objective: Filed Vitals:   05/24/13 1506  BP: 102/70  Pulse: 68  Temp: 98.4 F (36.9 C)  Resp: 16    General appearance: Alert, WD/WN, no distress                             Skin: warm, no rash                           Head: + Right-sided frontal and maxillary sinus tenderness,                            Eyes: conjunctiva normal, corneas clear, PERRLA                            Ears: Bilateral TMs if erythema, serous effusion, external ear canals normal                          Nose: septum midline, turbinates swollen, with erythema and mucoid discharge             Mouth/throat: MMM, tongue normal, mild pharyngeal erythema                           Neck: supple, no adenopathy, no thyromegaly, nontender                          Heart: RRR, normal S1, S2, no murmurs                         Lungs: CTA bilaterally, no wheezes, rales, or rhonchi MSK: Somewhat mobile 4 mm oval nodule of left fifth finger middle phalanx lateral sides suggestive of ganglion  cyst, otherwise fingers wrists and hands nontender, no deformity, normal range of motion, upper extremities unremarkable otherwise Upper extremities neurovascularly intact      Assessment and Plan: Encounter Diagnoses  Name Primary?  . Sinusitis Yes  . Paresthesia of both hands   . Allergic rhinitis   . Otitis media   . Cyst of finger    Sinusitis - Prescription given for Amoxicillin.  Can use OTC Mucinex for congestion.  Tylenol or Ibuprofen OTC for fever and malaise.  Discussed symptomatic relief, nasal saline flush, and call or return if  worse or not improving in 2-3 days.   paresthesia - begin QHS reinforced wrist splints for suspected early carpal tunnel syndrome  Allergies - begin Allegra, OTC flonase  Otitis media - amoxicillin  Cyst of finger - reassured, no treatment unless starts to get bigger or bother him  Heel pain, begin over-the-counter heel cups  He asked about Viagra samples today, deferred this to later visit as he is a new patient today  Followup soon for physical

## 2013-05-24 NOTE — Patient Instructions (Addendum)
  Thank you for giving me the opportunity to serve you today.    Your diagnosis today includes: Encounter Diagnoses  Name Primary?  . Sinusitis Yes  . Paresthesia of both hands   . Allergic rhinitis   . Otitis media   . Cyst of finger      Specific recommendations today include:  Begin Amoxicillin antibiotic x 10 days for sinus infection  Begin OTC Mucinex or Zyrtec or Allegra for allergies and for sinus congestion  Increase water intake  Consider nasal saline flush or Neti Pot  You can use Aleve once daily for sinus pressure and for possible carpal tunnel syndrome  Begin using reinforced wrist splints at night time for both hands  Begin heel cups OTC in shoes  Recheck in 1-2 months regarding possible carpal tunnel syndrome  If the cyst of the finger worsens, then you will need to see a hand surgeon

## 2013-11-03 ENCOUNTER — Encounter: Payer: Self-pay | Admitting: Family Medicine

## 2013-12-12 ENCOUNTER — Telehealth: Payer: Self-pay | Admitting: Family Medicine

## 2013-12-12 NOTE — Telephone Encounter (Signed)
Will forward to MD for confirmation. Jazmin Hartsell,CMA

## 2013-12-12 NOTE — Telephone Encounter (Signed)
Pt states that [facility] faxed over orders in order for him to get his CPAP parts. He would just like it confirmed that Dr. Darene Lamer has indeed received those request. Please advise.

## 2013-12-13 NOTE — Telephone Encounter (Signed)
LM for patient that form has not been received yet and if he could call and ask them to send it again. Jazmin Hartsell,CMA h

## 2013-12-13 NOTE — Telephone Encounter (Signed)
Please let him know I have not received this in my mailbox yet but will check again later today. Thanks. Hilton Sinclair, MD

## 2013-12-30 NOTE — Telephone Encounter (Signed)
LM for patient that rx was faxed. Jazmin Hartsell,CMA

## 2013-12-30 NOTE — Telephone Encounter (Signed)
Please let Mr Manalang know I have received the form and am faxing the prescription today.  Hilton Sinclair, MD

## 2014-03-17 ENCOUNTER — Encounter: Payer: Self-pay | Admitting: Medical

## 2014-03-17 ENCOUNTER — Ambulatory Visit (INDEPENDENT_AMBULATORY_CARE_PROVIDER_SITE_OTHER): Payer: 59 | Admitting: Medical

## 2014-03-17 VITALS — BP 110/70 | HR 93 | Temp 98.1°F | Resp 14 | Wt 272.0 lb

## 2014-03-17 DIAGNOSIS — J0100 Acute maxillary sinusitis, unspecified: Secondary | ICD-10-CM

## 2014-03-17 DIAGNOSIS — H9201 Otalgia, right ear: Secondary | ICD-10-CM

## 2014-03-17 MED ORDER — AMOXICILLIN 500 MG PO TABS: ORAL_TABLET | ORAL | Status: DC

## 2014-03-17 NOTE — Progress Notes (Signed)
Subjective:  Roger Mendoza is a 54 y.o. male who presents for sinus congestion x 2 weeks.  Associated symptoms include right ear pain, pressure behind right eye, headache, but denies fever, cough, nausea, vomiting, sore throat, crusting or drainage from eyes. Using Ocean Spray nasal as well as Alka-Seltzer oral. Denies sick contacts.  No other aggravating or relieving factors.  No other c/o.  ROS as in subjective.   Objective: Filed Vitals:   03/17/14 0813  BP: 110/70  Pulse: 93  Temp: 98.1 F (36.7 C)  Resp: 14    General appearance: Alert, WD/WN, no distress, mildly ill appearing                             Skin: warm, no rash                           Head: right maxillary sinus tenderness                            Eyes: conjunctiva normal, corneas clear, PERRLA                            Ears: Right TM with serous effusion, left TM normal, external ear canals normal                          Nose: septum midline, turbinates swollen, with erythema and clear discharge             Mouth/throat: MMM, tongue normal, mild pharyngeal erythema                           Neck: supple, no adenopathy, no thyromegaly, nontender                          Heart: RRR, normal S1, S2, no murmurs                         Lungs: CTA bilaterally, no wheezes, rales, or rhonchi     Assessment: Encounter Diagnoses  Name Primary?  . Acute maxillary sinusitis, recurrence not specified Yes  . Otalgia, right      Plan: Discussed diagnosis and treatment of sinusitis.  Suggested symptomatic OTC remedies. Begin Amoxicillin.  Nasal saline spray for congestion.  Tylenol or Ibuprofen OTC for fever and malaise.  Call/return in 2-3 days if symptoms aren't resolving.   Return soon as planned for physical, labs, and request for Viagra which he has used prior.

## 2014-04-14 ENCOUNTER — Ambulatory Visit (INDEPENDENT_AMBULATORY_CARE_PROVIDER_SITE_OTHER): Payer: 59 | Admitting: Medical

## 2014-04-14 ENCOUNTER — Encounter: Payer: Self-pay | Admitting: Medical

## 2014-04-14 VITALS — BP 100/70 | HR 76 | Temp 98.4°F | Resp 15 | Ht 72.0 in | Wt 276.0 lb

## 2014-04-14 DIAGNOSIS — N529 Male erectile dysfunction, unspecified: Secondary | ICD-10-CM

## 2014-04-14 DIAGNOSIS — Z0000 Encounter for general adult medical examination without abnormal findings: Secondary | ICD-10-CM

## 2014-04-14 DIAGNOSIS — E669 Obesity, unspecified: Secondary | ICD-10-CM | POA: Diagnosis not present

## 2014-04-14 DIAGNOSIS — R9431 Abnormal electrocardiogram [ECG] [EKG]: Secondary | ICD-10-CM

## 2014-04-14 DIAGNOSIS — Z8249 Family history of ischemic heart disease and other diseases of the circulatory system: Secondary | ICD-10-CM | POA: Diagnosis not present

## 2014-04-14 LAB — COMPREHENSIVE METABOLIC PANEL
ALT: 37 U/L (ref 0–53)
AST: 28 U/L (ref 0–37)
Albumin: 4.7 g/dL (ref 3.5–5.2)
Alkaline Phosphatase: 74 U/L (ref 39–117)
BILIRUBIN TOTAL: 0.7 mg/dL (ref 0.2–1.2)
BUN: 9 mg/dL (ref 6–23)
CO2: 28 mEq/L (ref 19–32)
CREATININE: 0.98 mg/dL (ref 0.50–1.35)
Calcium: 9.7 mg/dL (ref 8.4–10.5)
Chloride: 105 mEq/L (ref 96–112)
Glucose, Bld: 97 mg/dL (ref 70–99)
Potassium: 4.6 mEq/L (ref 3.5–5.3)
Sodium: 142 mEq/L (ref 135–145)
Total Protein: 7.5 g/dL (ref 6.0–8.3)

## 2014-04-14 LAB — LIPID PANEL
CHOLESTEROL: 191 mg/dL (ref 0–200)
HDL: 34 mg/dL — ABNORMAL LOW (ref >=40)
LDL Cholesterol: 131 mg/dL — ABNORMAL HIGH (ref 0–99)
Total CHOL/HDL Ratio: 5.6 Ratio
Triglycerides: 131 mg/dL (ref <150)
VLDL: 26 mg/dL (ref 0–40)

## 2014-04-14 LAB — POCT URINALYSIS DIPSTICK
Bilirubin, UA: NEGATIVE
Blood, UA: NEGATIVE
Glucose, UA: NEGATIVE
Ketones, UA: NEGATIVE
Leukocytes, UA: NEGATIVE
NITRITE UA: NEGATIVE
PH UA: 6
PROTEIN UA: NEGATIVE
Spec Grav, UA: 1.02
UROBILINOGEN UA: NEGATIVE

## 2014-04-14 LAB — CBC
HEMATOCRIT: 39.7 % (ref 39.0–52.0)
HEMOGLOBIN: 13.2 g/dL (ref 13.0–17.0)
MCH: 29.4 pg (ref 26.0–34.0)
MCHC: 33.2 g/dL (ref 30.0–36.0)
MCV: 88.4 fL (ref 78.0–100.0)
MPV: 10.6 fL (ref 8.6–12.4)
Platelets: 291 10*3/uL (ref 150–400)
RBC: 4.49 MIL/uL (ref 4.22–5.81)
RDW: 13.3 % (ref 11.5–15.5)
WBC: 7.4 10*3/uL (ref 4.0–10.5)

## 2014-04-14 LAB — TSH: TSH: 1.286 u[IU]/mL (ref 0.350–4.500)

## 2014-04-14 MED ORDER — SILDENAFIL CITRATE 100 MG PO TABS: 50.0000 mg | ORAL_TABLET | Freq: Every day | ORAL | Status: DC | PRN

## 2014-04-14 NOTE — Progress Notes (Signed)
Subjective:   HPI  Roger Mendoza is a 54 y.o. male who presents for a complete physical.  Medical care team includes:  Dr. Janice Norrie, urology [place] [facility] Audelia Acton, PA-C here for primary care   Preventative care: Last ophthalmology visit: yes seen once a year Last dental visit:yes Dr. Laverta [place] Last colonoscopy:Dr. Janice Norrie Last prostate exam: recently with Dr. Janice Norrie Last ZPH:XTAVW Last labs:2013  Prior vaccinations: TD or PVXY:8016 Influenza:declined Pneumococcal:n/.a Shingles/Zostavax:n/a   Concerns: ED- has used Viagra 100mg  prior with good results.  Last used 1 month ago, was getting through urology.  Has problems getting and keeping erections.     He acknowledges that he needs to do better with diet nad lose weight.  Reviewed their medical, surgical, family, social, medication, and allergy history and updated chart as appropriate.  Past Medical History  Diagnosis Date  . MRSA (methicillin resistant staph aureus) culture positive     skin on pt's back  . Colon polyp 02/2012    tubular adenoma, Dr. Zenovia Jarred  . OSA on CPAP   . Allergic rhinitis, seasonal   . Former smoker     stopped 02/2014;  2-3 cigarrettes per week prior, only smoked with alcohol use  . Tinea pedis   . Recurrent UTI     Dr. Janice Norrie, [facility]  . Epididymitis     hx/o as adult  . Erectile dysfunction   . De Quervain's tenosynovitis, right     hx/o  . Obesity   . Normal cardiac stress test     remote past per patient, [place], [place]  . Left inguinal hernia     tiny as of 03/2014  . Wears glasses     Past Surgical History  Procedure Laterality Date  . Rotator cuff repair  1997    left side  . Tonsillectomy    . Adenoidectomy    . Upper gastrointestinal endoscopy    . Skin surgery      large area of MRSA on back  . Esophageal dilation    . Colonoscopy  02/2012    Dr. Zenovia Jarred, repeat 02/2017    History   Social History  . Marital Status: Divorced    Spouse Name: N/A  . Number of  Children: N/A  . Years of Education: N/A   Occupational History  . Not on file.   Social History Main Topics  . Smoking status: Former Smoker -- 0.25 packs/day    Types: Cigarettes    Quit date: 03/31/2014  . Smokeless tobacco: Never Used  . Alcohol Use: No  . Drug Use: No  . Sexual Activity: Not on file   Other Topics Concern  . 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  Plays golf, used to play softball.  Attends church, deacon.    Family History  Problem Relation Age of Onset  . Hypertension Mother   . Arthritis Mother   . Hip dysplasia Mother   . Heart disease Mother   . Heart disease Father 61    died of MI  . Hyperlipidemia Father   . Colon cancer Neg Hx   . Esophageal cancer Neg Hx   . Rectal cancer Neg Hx   . Stomach cancer Neg Hx   . Cancer Neg Hx   . Diabetes Neg Hx   . Stroke Neg  Hx   . HIV Brother      Current outpatient prescriptions:  .  amoxicillin (AMOXIL) 500 MG tablet, 2 tablets po BID x 10 days (Patient not taking: Reported on 04/14/2014), Disp: 40 tablet, Rfl: 0 .  sildenafil (VIAGRA) 100 MG tablet, Take 0.5-1 tablets (50-100 mg total) by mouth daily as needed for erectile dysfunction., Disp: 10 tablet, Rfl: 2  No Known Allergies   Review of Systems Constitutional: -fever, -chills, -sweats, -unexpected weight change, -decreased appetite, -fatigue Allergy: -sneezing, -itching, -congestion Dermatology: -changing moles, --rash, -lumps, +red spot on upper chest ENT: -runny nose, -ear pain, -sore throat, -hoarseness, -sinus pain, -teeth pain, - ringing in ears, -hearing loss, -nosebleeds Cardiology: -chest pain, -palpitations, -swelling, -difficulty breathing when lying flat, -waking up short of breath Respiratory: -cough, -shortness of breath, -difficulty breathing with exercise or exertion, -wheezing,  -coughing up blood Gastroenterology: -abdominal pain, -nausea, -vomiting, -diarrhea, -constipation, -blood in stool, -changes in bowel movement, -difficulty swallowing or eating Hematology: -bleeding, -bruising  Musculoskeletal: -joint aches, -muscle aches, -joint swelling, -back pain, -neck pain, -cramping, -changes in gait Ophthalmology: denies vision changes, eye redness, itching, discharge Urology: -burning with urination, -difficulty urinating, -blood in urine, -urinary frequency, -urgency, -incontinence Neurology: -headache, -weakness, -tingling, -numbness, -memory loss, -falls, -dizziness Psychology: -depressed mood, -agitation, -sleep problems     Objective:   Physical Exam  BP 100/70 mmHg  Pulse 76  Temp(Src) 98.4 F (36.9 C) (Oral)  Resp 15  Ht 6' (1.829 m)  Wt 276 lb (125.193 kg)  BMI 37.42 kg/m2  General appearance: alert, no distress, WD/WN, obese AA male Skin: mid back right of spine with arrow head shaped scar from prior MRSA lesion, and left mid back just adjacent to other scar with linear surgical scar, no other worrisome lesions HEENT: normocephalic, conjunctiva/corneas normal, sclerae anicteric, PERRLA, EOMi, nares patent, no discharge or erythema, pharynx normal Oral cavity: MMM, tongue normal, teeth normal Neck: supple, no lymphadenopathy, no thyromegaly, no masses, normal ROM, no bruits Chest: non tender, normal shape and expansion Heart: RRR, normal S1, S2, no murmurs Lungs: CTA bilaterally, no wheezes, rhonchi, or rales Abdomen: +bs, soft, non tender, non distended, no masses, no hepatomegaly, no splenomegaly, no bruits Back: non tender, normal ROM, no scoliosis Musculoskeletal: upper extremities non tender, no obvious deformity, normal ROM throughout, lower extremities non tender, no obvious deformity, normal ROM throughout Extremities: no edema, no cyanosis, no clubbing Pulses: 2+ symmetric, upper and lower extremities, normal cap refill Neurological:  alert, oriented x 3, CN2-12 intact, strength normal upper extremities and lower extremities, sensation normal throughout, DTRs 2+ throughout, no cerebellar signs, gait normal Psychiatric: normal affect, behavior normal, pleasant  GU: normal male external genitalia, circumcised, nontender, no masses,small left inguinal hernia, no lymphadenopathy Rectal: deferred to urology   Adult ECG Report  Indication: physical, ED   Rate: 66 bpm  Rhythm: normal sinus rhythm  QRS Axis: 65 degrees  PR Interval: 121ms  QRS Duration: 116ms  QTc: 413ms   Conduction Disturbances: none  Other Abnormalities: possible LVH, ST elevation possibly due to early repolarization precordial leads  Patient's cardiac risk factors are: male gender and obesity (BMI >= 30 kg/m2).  EKG comparison: none  Narrative Interpretation: ST elevation, possibly due to early repolarization, possible LVH     Assessment and Plan :    Encounter Diagnoses  Name Primary?  . Encounter for health maintenance examination in adult Yes  . Erectile dysfunction, unspecified erectile dysfunction type   . Obesity   . Family history  of premature CAD   . Nonspecific abnormal electrocardiogram (ECG) (EKG)     Physical exam - discussed healthy lifestyle, diet, exercise, preventative care, vaccinations, and addressed their concerns.  Reviewed and updated health history. See your eye doctor yearly for routine vision care. See your dentist yearly for routine dental care including hygiene visits twice yearly. obesity - work on weight loss with lifestyle changes as discussed We will call with lab results Erectile Dysfunction - Reviewed pathophysiology and differential diagnosis of erectile dysfunction with the patient.  Discussed treatment options. C/t Viagra prn.  Discussed potential risks of medications including hypotension and priapism.  Discussed proper use of medication.  Questions were answered.  Family hx/o premature CAD Abnormal EKG -  request copy of last stress test and EKG from several years ago through [facility] heart [facility].  May need repeat testing pending records. Follow-up pending labs

## 2014-04-15 LAB — HEMOGLOBIN A1C
HEMOGLOBIN A1C: 5.2 % (ref <5.7)
Mean Plasma Glucose: 103 mg/dL (ref <117)

## 2014-04-17 ENCOUNTER — Telehealth: Payer: Self-pay | Admitting: Medical

## 2014-04-17 NOTE — Telephone Encounter (Signed)
Roger Mendoza, patient will call insurance and let us know about echocardiogram. Andria Frames, can you see about this compliance report on his CPAP? I am not familiar with this.

## 2014-04-17 NOTE — Telephone Encounter (Signed)
Please let him know that I received a prior EKG from 2007 but not the stress test. The EKG actually looks pretty much the same so those abnormalities are not new.  It has been almost 10 years ago and the EKG suggests enlargement of the heart.    Recommendations: Please call his home health company and get a CPAP compliance report Secondly, it may be worthwhile getting an updated echocardiogram/ultrasound of his heart.  Please have him check insurance copay for this.    Lets f/u pending CPAP compliance report and call back from him on copay for echo

## 2014-04-17 NOTE — Telephone Encounter (Signed)
See msg

## 2014-04-22 NOTE — Telephone Encounter (Signed)
[facility] sent a fax for read out

## 2014-04-25 ENCOUNTER — Encounter: Payer: Self-pay | Admitting: Medical

## 2014-05-02 ENCOUNTER — Telehealth: Payer: Self-pay | Admitting: Medical

## 2014-05-02 NOTE — Telephone Encounter (Signed)
I received his compliance report for CPAP which looks good, see if he call the insurance about doing the echocardiogram, copay?

## 2014-05-05 NOTE — Telephone Encounter (Signed)
Patient states that he did speak with his insurance company and the copay his to high and he can't afford that right now. Patient is aware of Dorothea Ogle PA message

## 2015-02-06 ENCOUNTER — Ambulatory Visit (INDEPENDENT_AMBULATORY_CARE_PROVIDER_SITE_OTHER): Payer: 59 | Admitting: Family Medicine

## 2015-02-06 ENCOUNTER — Encounter: Payer: Self-pay | Admitting: Family Medicine

## 2015-02-06 ENCOUNTER — Other Ambulatory Visit: Payer: Self-pay | Admitting: Family Medicine

## 2015-02-06 VITALS — BP 122/78 | HR 64

## 2015-02-06 DIAGNOSIS — N529 Male erectile dysfunction, unspecified: Secondary | ICD-10-CM

## 2015-02-06 DIAGNOSIS — N50811 Right testicular pain: Secondary | ICD-10-CM

## 2015-02-06 LAB — POCT URINALYSIS DIPSTICK
Bilirubin, UA: NEGATIVE
Glucose, UA: NEGATIVE
Ketones, UA: NEGATIVE
Leukocytes, UA: NEGATIVE
NITRITE UA: NEGATIVE
PH UA: 6
Protein, UA: NEGATIVE
RBC UA: NEGATIVE
Urobilinogen, UA: NEGATIVE

## 2015-02-06 MED ORDER — SILDENAFIL CITRATE 100 MG PO TABS: 50.0000 mg | ORAL_TABLET | Freq: Every day | ORAL | Status: DC | PRN

## 2015-02-06 MED ORDER — CIPROFLOXACIN HCL 500 MG PO TABS: 500.0000 mg | ORAL_TABLET | Freq: Two times a day (BID) | ORAL | Status: DC

## 2015-02-06 NOTE — Patient Instructions (Signed)
Try advil 3 times daily with food and staying well hydrated. Let us know if your symptoms get worse or if you are continuing to have pain in 4-5 days. You may want to follow up with your urologist as well if pain persists.

## 2015-02-06 NOTE — Progress Notes (Signed)
   Subjective:    Patient ID: Roger Mendoza, male    DOB: 08-02-1960, 55 y.o.   MRN: [id number]  HPI Chief Complaint  Patient presents with  . pain    pain on right side testicle   He is here with intermittent pain to right testicle for past 2 weeks that occasionally radiates into his right groin. States pain is worse with movement but otherwise it is a nagging pain. States pain is like being hit in the testicle.  He states today is first day that he has not noticed pain in the morning.  Denies fever, chills, nausea, vomiting, abdominal pain.  Having a "tingle with urination". No hesitancy, dysuria, changes with urine stream, no urine leakage (he reports he has had this in past).  He reports history of UTIs and sees urologist for this and an enlarged prostate. Last went to urologist in September. He states he took an antibiotic for UTI in September. Nitrofurantoin.   He has history of ED and states he has not been able to fill prescription for viagra due to cost. He is requesting viagra samples.    Review of Systems Pertinent positives and negatives in the history of present illness.    Objective:   Physical Exam  Constitutional: He appears well-developed and well-nourished. No distress.  Abdominal: Soft. Normal appearance. There is no tenderness. There is no rebound, no guarding, no tenderness at McBurney's point and negative Murphy's sign. Hernia confirmed negative in the right inguinal area and confirmed negative in the left inguinal area.  Genitourinary: Penis normal. Cremasteric reflex is present. Right testis shows tenderness. Right testis shows no mass and no swelling. Left testis shows no mass, no swelling and no tenderness.  Declined prostate exam  Lymphadenopathy:       Right: No inguinal adenopathy present.       Left: No inguinal adenopathy present.   BP 122/78 mmHg  Pulse 64     Assessment & Plan:  Pain in right testicle  Erectile dysfunction, unspecified erectile  dysfunction type  He does not appear infectious and no systemic illness suspected. UA dipstick negative. Genitalia exam performed with chaperone and positive for tenderness to right testicle, otherwise normal. Symptoms are suspicious for orchitis. Will treat for 14 days with Cipro.  He sees [facility] for ED and UTIs. Samples of viagra given- 4 pills.   Discussed patient with Audelia Acton, [place] who is his PCP.

## 2015-02-07 LAB — GC/CHLAMYDIA PROBE AMP
CT PROBE, AMP APTIMA: NOT DETECTED
GC PROBE AMP APTIMA: NOT DETECTED

## 2015-02-13 ENCOUNTER — Encounter: Payer: Self-pay | Admitting: Medical

## 2015-05-11 ENCOUNTER — Encounter: Payer: Self-pay | Admitting: Medical

## 2015-05-11 ENCOUNTER — Ambulatory Visit (INDEPENDENT_AMBULATORY_CARE_PROVIDER_SITE_OTHER): Payer: 59 | Admitting: Medical

## 2015-05-11 VITALS — BP 120/78 | HR 81 | Ht 71.25 in | Wt 283.0 lb

## 2015-05-11 DIAGNOSIS — N390 Urinary tract infection, site not specified: Secondary | ICD-10-CM

## 2015-05-11 DIAGNOSIS — Z0000 Encounter for general adult medical examination without abnormal findings: Secondary | ICD-10-CM

## 2015-05-11 DIAGNOSIS — J309 Allergic rhinitis, unspecified: Secondary | ICD-10-CM | POA: Diagnosis not present

## 2015-05-11 DIAGNOSIS — G4733 Obstructive sleep apnea (adult) (pediatric): Secondary | ICD-10-CM | POA: Diagnosis not present

## 2015-05-11 DIAGNOSIS — N5082 Scrotal pain: Secondary | ICD-10-CM

## 2015-05-11 DIAGNOSIS — F17200 Nicotine dependence, unspecified, uncomplicated: Secondary | ICD-10-CM

## 2015-05-11 DIAGNOSIS — Z125 Encounter for screening for malignant neoplasm of prostate: Secondary | ICD-10-CM

## 2015-05-11 DIAGNOSIS — Z9989 Dependence on other enabling machines and devices: Secondary | ICD-10-CM

## 2015-05-11 DIAGNOSIS — N529 Male erectile dysfunction, unspecified: Secondary | ICD-10-CM | POA: Diagnosis not present

## 2015-05-11 DIAGNOSIS — Z8249 Family history of ischemic heart disease and other diseases of the circulatory system: Secondary | ICD-10-CM | POA: Diagnosis not present

## 2015-05-11 DIAGNOSIS — M545 Low back pain, unspecified: Secondary | ICD-10-CM

## 2015-05-11 DIAGNOSIS — E669 Obesity, unspecified: Secondary | ICD-10-CM | POA: Diagnosis not present

## 2015-05-11 LAB — LIPID PANEL
Cholesterol: 192 mg/dL (ref 125–200)
HDL: 37 mg/dL — AB (ref >=40)
LDL CALC: 137 mg/dL — AB (ref <130)
Total CHOL/HDL Ratio: 5.2 Ratio — ABNORMAL HIGH (ref <=5.0)
Triglycerides: 92 mg/dL (ref <150)
VLDL: 18 mg/dL (ref <30)

## 2015-05-11 LAB — COMPREHENSIVE METABOLIC PANEL
ALK PHOS: 69 U/L (ref 40–115)
ALT: 26 U/L (ref 9–46)
AST: 23 U/L (ref 10–35)
Albumin: 4.7 g/dL (ref 3.6–5.1)
BILIRUBIN TOTAL: 0.6 mg/dL (ref 0.2–1.2)
BUN: 8 mg/dL (ref 7–25)
CALCIUM: 9.3 mg/dL (ref 8.6–10.3)
CO2: 22 mmol/L (ref 20–31)
Chloride: 106 mmol/L (ref 98–110)
Creat: 1.15 mg/dL (ref 0.70–1.33)
GLUCOSE: 98 mg/dL (ref 65–99)
Potassium: 4.4 mmol/L (ref 3.5–5.3)
SODIUM: 142 mmol/L (ref 135–146)
Total Protein: 7.5 g/dL (ref 6.1–8.1)

## 2015-05-11 LAB — CBC
HCT: 40.9 % (ref 38.5–50.0)
HEMOGLOBIN: 13.7 g/dL (ref 13.2–17.1)
MCH: 29.8 pg (ref 27.0–33.0)
MCHC: 33.5 g/dL (ref 32.0–36.0)
MCV: 88.9 fL (ref 80.0–100.0)
MPV: 11.5 fL (ref 7.5–12.5)
Platelets: 261 10*3/uL (ref 140–400)
RBC: 4.6 MIL/uL (ref 4.20–5.80)
RDW: 13.3 % (ref 11.0–15.0)
WBC: 8.3 10*3/uL (ref 4.0–10.5)

## 2015-05-11 LAB — POCT URINALYSIS DIPSTICK
BILIRUBIN UA: NEGATIVE
Glucose, UA: NEGATIVE
Ketones, UA: NEGATIVE
LEUKOCYTES UA: NEGATIVE
NITRITE UA: NEGATIVE
PH UA: 5.5
PROTEIN UA: NEGATIVE
RBC UA: NEGATIVE
Spec Grav, UA: 1.025
UROBILINOGEN UA: NEGATIVE

## 2015-05-11 MED ORDER — SILDENAFIL CITRATE 100 MG PO TABS: 50.0000 mg | ORAL_TABLET | Freq: Every day | ORAL | Status: DC | PRN

## 2015-05-11 MED ORDER — FLUTICASONE PROPIONATE 50 MCG/ACT NA SUSP: 2.0000 | Freq: Every day | NASAL | Status: DC

## 2015-05-11 NOTE — Progress Notes (Signed)
Subjective:   HPI  Roger Mendoza is a 55 y.o. male who presents for a complete physical.  Medical care team includes:  Dr. Janice Norrie, urology Jerra Huckeby [facility], PA-C here for primary care   Concerns: ED- has used Viagra '100mg'$  prior with good results.  Has problems getting and keeping erections.     He acknowledges that he needs to do better with diet nad lose weight.  Gets intermittent low back pain, sometimes right low back/flank pain  Has hx/o intermittent epididymitis, right scrotal discomfort occasionally  Reviewed their medical, surgical, family, social, medication, and allergy history and updated chart as appropriate.  Past Medical History  Diagnosis Date  . MRSA (methicillin resistant staph aureus) culture positive     skin on pt's back  . Colon polyp 02/2012    tubular adenoma, Dr. Zenovia Jarred  . OSA on CPAP   . Allergic rhinitis, seasonal   . Tinea pedis   . Recurrent UTI     Dr. Janice Norrie, [facility]  . Epididymitis     hx/o as adult  . Erectile dysfunction   . De Quervain's tenosynovitis, right     hx/o  . Obesity   . Normal cardiac stress test     remote past per patient, [place], [place]  . Left inguinal hernia     tiny as of 03/2014  . Wears glasses   . Tobacco use     4 cigarrettes daily  . Family history of premature CAD     father    Past Surgical History  Procedure Laterality Date  . Rotator cuff repair  1997    left side  . Tonsillectomy    . Adenoidectomy    . Upper gastrointestinal endoscopy    . Skin surgery      large area of MRSA on back  . Esophageal dilation    . Colonoscopy  02/2012    Dr. Zenovia Jarred, repeat 02/2017    Social History   Social History  . Marital Status: Divorced    Spouse Name: N/A  . Number of Children: N/A  . Years of Education: N/A   Occupational History  . Not on file.   Social History Main Topics  . Smoking status: Former Smoker -- 0.25 packs/day    Types: Cigarettes    Quit date: 03/31/2014  .  Smokeless tobacco: Never Used  . Alcohol Use: 7.2 oz/week    12 Cans of beer per week  . Drug Use: No  . Sexual Activity: Not on file   Other Topics Concern  . 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  Plays golf, used to play softball.  Attends church, deacon.    Family History  Problem Relation Age of Onset  . Hypertension Mother   . Arthritis Mother   . Hip dysplasia Mother   . Heart disease Mother   . Heart disease Father 89    died of MI  . Hyperlipidemia Father   . Colon cancer Neg Hx   . Esophageal cancer Neg Hx   . Rectal cancer Neg Hx   . Stomach cancer Neg Hx   . Cancer Neg Hx   . Diabetes Neg Hx   . Stroke Neg Hx   . HIV Brother      Current outpatient prescriptions:  .  sildenafil (VIAGRA) 100 MG tablet, Take 0.5-1 tablets (50-100 mg total) by mouth daily as needed for erectile dysfunction., Disp: 45 tablet, Rfl: 2  No Known Allergies   Review of Systems Constitutional: -fever, -chills, -sweats, -unexpected weight change, -decreased appetite, -fatigue Allergy: -sneezing, -itching, -congestion Dermatology: -changing moles, --rash, -lumps, +red spot on upper chest ENT: -runny nose, -ear pain, -sore throat, -hoarseness, -sinus pain, -teeth pain, - ringing in ears, -hearing loss, -nosebleeds Cardiology: -chest pain, -palpitations, -swelling, -difficulty breathing when lying flat, -waking up short of breath Respiratory: -cough, -shortness of breath, -difficulty breathing with exercise or exertion, -wheezing, -coughing up blood Gastroenterology: -abdominal pain, -nausea, -vomiting, -diarrhea, -constipation, -blood in stool, -changes in bowel movement, -difficulty swallowing or eating Hematology: -bleeding, -bruising  Musculoskeletal: -joint aches, -muscle aches, -joint swelling, +back pain, -neck pain, -cramping, -changes  in gait Ophthalmology: denies vision changes, eye redness, itching, discharge Urology: -burning with urination, -difficulty urinating, -blood in urine, -urinary frequency, -urgency, -incontinence Neurology: -headache, -weakness, -tingling, -numbness, -memory loss, -falls, -dizziness Psychology: -depressed mood, -agitation, -sleep problems     Objective:   Physical Exam  BP 120/78 mmHg  Pulse 81  Ht 5' 11.25" (1.81 m)  Wt 283 lb (128.368 kg)  BMI 39.18 kg/m2  General appearance: alert, no distress, WD/WN, obese AA male Skin: mid back right of spine with arrow head shaped scar from prior MRSA lesion, and left mid back just adjacent to other scar with linear surgical scar, another small linear surgical scar to right of the arrow head shape scar, no other worrisome lesions HEENT: normocephalic, conjunctiva/corneas normal, sclerae anicteric, PERRLA, EOMi, nares patent, no discharge or erythema, pharynx normal Oral cavity: MMM, tongue normal, teeth in good repair Neck: supple, no lymphadenopathy, no thyromegaly, no masses, normal ROM, no bruits Chest: non tender, normal shape and expansion Heart: RRR, normal S1, S2, no murmurs Lungs: CTA bilaterally, no wheezes, rhonchi, or rales Abdomen: +bs, soft, non tender, non distended, no masses, no hepatomegaly, no splenomegaly, no bruits Back: non tender, normal ROM, no scoliosis Musculoskeletal: upper extremities non tender, no obvious deformity, normal ROM throughout, lower extremities non tender, no obvious deformity, normal ROM throughout Extremities: no edema, no cyanosis, no clubbing Pulses: 2+ symmetric, upper and lower extremities, normal cap refill Neurological: alert, oriented x 3, CN2-12 intact, strength normal upper extremities and lower extremities, sensation normal throughout, DTRs 2+ throughout, no cerebellar signs, gait normal Psychiatric: normal affect, behavior normal, pleasant  GU: normal male external genitalia, circumcised,  nontender, slight tenderness of right testicle on exam, but no abnormality, no masses,small left inguinal hernia, no lymphadenopathy Rectal: prostate WNL, no nodules, few small external hemorrhoids present   Assessment and Plan :    Encounter Diagnoses  Name Primary?  . Routine general medical examination at a health care facility Yes  . OSA on CPAP   . TOBACCO ABUSE   . Allergic rhinitis, unspecified allergic rhinitis type   . Encounter for health maintenance examination in adult   . Recurrent UTI   . Erectile dysfunction, unspecified erectile dysfunction type   . Bilateral low back pain without sciatica   . Family history of premature CAD   . Obesity   . Screening for prostate cancer   . Scrotal pain     Physical exam - discussed healthy lifestyle, diet, exercise, preventative care, vaccinations, and addressed their concerns.  Reviewed and updated health history. See your eye doctor yearly for routine vision care. See your dentist yearly for routine dental care including hygiene visits  twice yearly. obesity - work on weight loss with lifestyle changes as discussed We will call with lab results Erectile Dysfunction - Reviewed pathophysiology and differential diagnosis of erectile dysfunction with the patient.  Discussed treatment options. C/t Viagra prn.  Discussed potential risks of medications including hypotension and priapism.  Discussed proper use of medication.  Questions were answered.  Back pain - discussed daily stretching routine, regularly daily exercise.  Consider xray.   He will let me know about xray L spine.  Of note, he has hx/o EDSI. Scrotal pain - consider referral back to Urology. OSA - consider updated sleep eval for updating CPAP supplies/machine Allergic rhinitis - c/t Flonase Family hx/o premature CAD Follow-up pending labs

## 2015-05-11 NOTE — Patient Instructions (Signed)
Encounter Diagnoses  Name Primary?  . Routine general medical examination at a health care facility Yes  . OSA on CPAP   . TOBACCO ABUSE   . Allergic rhinitis, unspecified allergic rhinitis type   . Encounter for health maintenance examination in adult   . Recurrent UTI   . Erectile dysfunction, unspecified erectile dysfunction type   . Bilateral low back pain without sciatica   . Family history of premature CAD   . Obesity   . Screening for prostate cancer   . Scrotal pain     Recommendations:  See your eye doctor yearly for routine vision care.  See your dentist yearly for routine dental care including hygiene visits twice yearly.  Obesity - work on weight loss with daily exercise and a healthy low fat diet  We will call with lab results  Erectile Dysfunction - use Viagra as needed.   Consider the pharmacy options we discussed  Back pain - work on daily exercise and stretching routine.  We can potentially get an updated xray  Groin pain - we can consider ultrasound or referral back to Urology  OSA - consider updated sleep eval for updating CPAP supplies/machine  Allergic rhinitis - continue daily Flonase nasal spray

## 2015-05-12 ENCOUNTER — Other Ambulatory Visit: Payer: Self-pay | Admitting: Medical

## 2015-05-12 LAB — HEMOGLOBIN A1C
HEMOGLOBIN A1C: 4.9 % (ref <5.7)
MEAN PLASMA GLUCOSE: 94 mg/dL

## 2015-05-12 LAB — PSA: PSA: 0.49 ng/mL (ref <=4.00)

## 2015-05-12 MED ORDER — ASPIRIN EC 81 MG PO TBEC: 81.0000 mg | DELAYED_RELEASE_TABLET | Freq: Every day | ORAL | Status: DC

## 2015-05-12 MED ORDER — PRAVASTATIN SODIUM 20 MG PO TABS: 20.0000 mg | ORAL_TABLET | Freq: Every day | ORAL | Status: DC

## 2015-12-10 ENCOUNTER — Ambulatory Visit: Payer: 59 | Admitting: Medical

## 2016-03-11 ENCOUNTER — Ambulatory Visit (INDEPENDENT_AMBULATORY_CARE_PROVIDER_SITE_OTHER): Payer: 59 | Admitting: Medical

## 2016-03-11 ENCOUNTER — Encounter: Payer: Self-pay | Admitting: Medical

## 2016-03-11 ENCOUNTER — Encounter (HOSPITAL_COMMUNITY): Payer: Self-pay

## 2016-03-11 ENCOUNTER — Emergency Department (HOSPITAL_COMMUNITY): Payer: 59

## 2016-03-11 ENCOUNTER — Observation Stay (HOSPITAL_COMMUNITY)
Admission: EM | Admit: 2016-03-11 | Discharge: 2016-03-12 | Disposition: A | Discharge status: Home / Self Care | Payer: 59 | Attending: Internal Medicine | Admitting: Internal Medicine

## 2016-03-11 VITALS — BP 128/82 | HR 85 | Wt 279.4 lb

## 2016-03-11 DIAGNOSIS — E669 Obesity, unspecified: Secondary | ICD-10-CM

## 2016-03-11 DIAGNOSIS — Z7951 Long term (current) use of inhaled steroids: Secondary | ICD-10-CM | POA: Diagnosis not present

## 2016-03-11 DIAGNOSIS — Z9989 Dependence on other enabling machines and devices: Secondary | ICD-10-CM

## 2016-03-11 DIAGNOSIS — G4733 Obstructive sleep apnea (adult) (pediatric): Secondary | ICD-10-CM | POA: Diagnosis not present

## 2016-03-11 DIAGNOSIS — R079 Chest pain, unspecified: Secondary | ICD-10-CM | POA: Diagnosis present

## 2016-03-11 DIAGNOSIS — E785 Hyperlipidemia, unspecified: Secondary | ICD-10-CM | POA: Diagnosis not present

## 2016-03-11 DIAGNOSIS — Z6836 Body mass index (BMI) 36.0-36.9, adult: Secondary | ICD-10-CM | POA: Insufficient documentation

## 2016-03-11 DIAGNOSIS — Z87891 Personal history of nicotine dependence: Secondary | ICD-10-CM | POA: Diagnosis not present

## 2016-03-11 DIAGNOSIS — R55 Syncope and collapse: Secondary | ICD-10-CM

## 2016-03-11 DIAGNOSIS — Z8614 Personal history of Methicillin resistant Staphylococcus aureus infection: Secondary | ICD-10-CM | POA: Diagnosis not present

## 2016-03-11 DIAGNOSIS — IMO0001 Reserved for inherently not codable concepts without codable children: Secondary | ICD-10-CM

## 2016-03-11 DIAGNOSIS — R0602 Shortness of breath: Secondary | ICD-10-CM

## 2016-03-11 DIAGNOSIS — Z7982 Long term (current) use of aspirin: Secondary | ICD-10-CM | POA: Diagnosis not present

## 2016-03-11 DIAGNOSIS — I200 Unstable angina: Secondary | ICD-10-CM

## 2016-03-11 DIAGNOSIS — F17200 Nicotine dependence, unspecified, uncomplicated: Secondary | ICD-10-CM | POA: Diagnosis present

## 2016-03-11 DIAGNOSIS — N529 Male erectile dysfunction, unspecified: Secondary | ICD-10-CM | POA: Insufficient documentation

## 2016-03-11 DIAGNOSIS — R0789 Other chest pain: Secondary | ICD-10-CM | POA: Diagnosis not present

## 2016-03-11 DIAGNOSIS — R42 Dizziness and giddiness: Secondary | ICD-10-CM

## 2016-03-11 DIAGNOSIS — Z8249 Family history of ischemic heart disease and other diseases of the circulatory system: Secondary | ICD-10-CM | POA: Insufficient documentation

## 2016-03-11 DIAGNOSIS — R071 Chest pain on breathing: Secondary | ICD-10-CM | POA: Diagnosis not present

## 2016-03-11 LAB — BASIC METABOLIC PANEL
ANION GAP: 11 (ref 5–15)
BUN: 10 mg/dL (ref 6–20)
CHLORIDE: 103 mmol/L (ref 101–111)
CO2: 25 mmol/L (ref 22–32)
Calcium: 9.5 mg/dL (ref 8.9–10.3)
Creatinine, Ser: 1.16 mg/dL (ref 0.61–1.24)
GFR calc non Af Amer: >60 mL/min (ref >60)
Glucose, Bld: 86 mg/dL (ref 65–99)
POTASSIUM: 4.3 mmol/L (ref 3.5–5.1)
SODIUM: 139 mmol/L (ref 135–145)

## 2016-03-11 LAB — CBC
HEMATOCRIT: 38.7 % — AB (ref 39.0–52.0)
HEMOGLOBIN: 13.3 g/dL (ref 13.0–17.0)
MCH: 30 pg (ref 26.0–34.0)
MCHC: 34.4 g/dL (ref 30.0–36.0)
MCV: 87.2 fL (ref 78.0–100.0)
Platelets: 257 10*3/uL (ref 150–400)
RBC: 4.44 MIL/uL (ref 4.22–5.81)
RDW: 13.2 % (ref 11.5–15.5)
WBC: 12.4 10*3/uL — AB (ref 4.0–10.5)

## 2016-03-11 LAB — MRSA PCR SCREENING: MRSA BY PCR: NEGATIVE

## 2016-03-11 LAB — TROPONIN I: Troponin I: <0.03 ng/mL (ref <0.03)

## 2016-03-11 MED ORDER — ACETAMINOPHEN 325 MG PO TABS: 650.0000 mg | ORAL_TABLET | ORAL | Status: DC | PRN

## 2016-03-11 MED ORDER — ASPIRIN EC 81 MG PO TBEC
81.0000 mg | DELAYED_RELEASE_TABLET | Freq: Every day | ORAL | Status: DC
  Administered 2016-03-12: 81 mg via ORAL
  Filled 2016-03-11: qty 1

## 2016-03-11 MED ORDER — ONDANSETRON HCL 4 MG/2ML IJ SOLN: 4.0000 mg | Freq: Four times a day (QID) | INTRAMUSCULAR | Status: DC | PRN

## 2016-03-11 MED ORDER — FLUTICASONE PROPIONATE 50 MCG/ACT NA SUSP
2.0000 | Freq: Every day | NASAL | Status: DC
  Administered 2016-03-12: 2 via NASAL
  Filled 2016-03-11: qty 16

## 2016-03-11 MED ORDER — PRAVASTATIN SODIUM 20 MG PO TABS
20.0000 mg | ORAL_TABLET | Freq: Every day | ORAL | Status: DC
  Administered 2016-03-11 – 2016-03-12 (×2): 20 mg via ORAL
  Filled 2016-03-11 (×2): qty 1

## 2016-03-11 NOTE — H&P (Signed)
History and Physical    Roger Mendoza:[id number] DOB: 03-13-1960 DOA: 03/11/2016  Referring MD/NP/PA: Chana Bode   PCP: Crisoforo Oxford, PA-C   Patient coming from: home /PCP office  Chief Complaint: chest pain  HPI: Roger Mendoza is a 56 y.o. male with medical history significant dyslipidemia who presented with mid sternal chest pain started at today, sharp, intermittent and radiating to left arm and jaw and feeling as if there is a lump in the neck. Pain was more prominent with exertion and better with rest. He also reported intermittent shortness of breath. He took his daily aspirin but there was no significant improvement in pain. No associated nausea or vomiting. No abdominal pain. No fevers or chills or cough.   ED Course: In ED, pt was hemodynamically stable. Blood work was notable for mild leukocytosis of 12.4 otherwise unremarkable. Troponin was WNL. The 12 lead EKG showed sinus rhythm. CXR showed no acute cardiopulmonary findings. Pt admitted for chest pain evaluation as his heart score was 4.  Review of Systems:  Constitutional: Negative for fever, chills, diaphoresis, activity change, appetite change and fatigue.  HENT: Negative for ear pain, nosebleeds, congestion, facial swelling, rhinorrhea, neck pain, neck stiffness and ear discharge.   Eyes: Negative for pain, discharge, redness, itching and visual disturbance.  Respiratory: Negative for cough, choking, chest tightness, shortness of breath, wheezing and stridor.   Cardiovascular: per HPI Gastrointestinal: Negative for abdominal distention.  Genitourinary: Negative for dysuria, urgency, frequency, hematuria, flank pain, decreased urine volume, difficulty urinating and dyspareunia.  Musculoskeletal: Negative for back pain, joint swelling, arthralgias and gait problem.  Neurological: Negative for dizziness, tremors, seizures, syncope, facial asymmetry, speech difficulty, weakness, light-headedness,  numbness and headaches.  Hematological: Negative for adenopathy. Does not bruise/bleed easily.  Psychiatric/Behavioral: Negative for hallucinations, behavioral problems, confusion, dysphoric mood, decreased concentration and agitation.   Past Medical History:  Diagnosis Date  . Allergic rhinitis, seasonal   . Colon polyp 02/2012   tubular adenoma, Dr. Zenovia Jarred  . De Quervain's tenosynovitis, right    hx/o  . Epididymitis    hx/o as adult  . Erectile dysfunction   . Family history of premature CAD    father  . Left inguinal hernia    tiny as of 03/2014  . MRSA (methicillin resistant staph aureus) culture positive    skin on pt's back  . Normal cardiac stress test    remote past per patient, [place], [place]  . Obesity   . OSA on CPAP   . Recurrent UTI    Dr. Janice Norrie, [facility]  . Tinea pedis   . Tobacco use    4 cigarrettes daily  . Wears glasses     Past Surgical History:  Procedure Laterality Date  . ADENOIDECTOMY    . COLONOSCOPY  02/2012   Dr. Zenovia Jarred, repeat 02/2017  . ESOPHAGEAL DILATION    . ROTATOR CUFF REPAIR  1997   left side  . SKIN SURGERY     large area of MRSA on back  . TONSILLECTOMY    . UPPER GASTROINTESTINAL ENDOSCOPY      Social history:  reports that he quit smoking about 1 years ago. His smoking use included Cigarettes. He smoked 0.25 packs per day. He has never used smokeless tobacco. He reports that he drinks about 7.2 oz of alcohol per week . He reports that he does not use drugs.  Ambulation: ambulates without assistance at baseline   No Known Allergies  Family  History  Problem Relation Age of Onset  . Hypertension Mother   . Arthritis Mother   . Hip dysplasia Mother   . Heart disease Mother   . Heart disease Father 84    died of MI  . Hyperlipidemia Father   . HIV Brother   . Colon cancer Neg Hx   . Esophageal cancer Neg Hx   . Rectal cancer Neg Hx   . Stomach cancer Neg Hx   . Cancer Neg Hx   . Diabetes Neg Hx   . Stroke  Neg Hx     Prior to Admission medications   Medication Sig Start Date End Date Taking? Authorizing Provider  aspirin EC 81 MG tablet Take 1 tablet (81 mg total) by mouth daily. 05/12/15  Yes Camelia Eng Tysinger, PA-C  fluticasone (FLONASE) 50 MCG/ACT nasal spray Place 2 sprays into both nostrils daily. 05/11/15  Yes Camelia Eng Tysinger, PA-C  pravastatin (PRAVACHOL) 20 MG tablet Take 1 tablet (20 mg total) by mouth daily. 05/12/15  Yes Camelia Eng Tysinger, PA-C  sildenafil (VIAGRA) 100 MG tablet Take 0.5-1 tablets (50-100 mg total) by mouth daily as needed for erectile dysfunction. 05/11/15  Yes Carlena Hurl, PA-C    Physical Exam: Vitals:   03/11/16 1600 03/11/16 1615 03/11/16 1715 03/11/16 1730  BP: 118/61  128/68 129/68  Pulse:  78 74 76  Resp:  '17 14 20  '$ Temp:      TempSrc:      SpO2:  98% 96% 99%  Weight:      Height:        Constitutional: NAD, calm, comfortable Vitals:   03/11/16 1600 03/11/16 1615 03/11/16 1715 03/11/16 1730  BP: 118/61  128/68 129/68  Pulse:  78 74 76  Resp:  '17 14 20  '$ Temp:      TempSrc:      SpO2:  98% 96% 99%  Weight:      Height:       Eyes: PERRL, lids and conjunctivae normal ENMT: Mucous membranes are moist. Posterior pharynx clear of any exudate or lesions.Normal dentition.  Neck: normal, supple, no masses, no thyromegaly Respiratory: clear to auscultation bilaterally, no wheezing, no crackles. Normal respiratory effort. No accessory muscle use.  Cardiovascular: Regular rate and rhythm, no murmurs / rubs / gallops. No extremity edema. 2+ pedal pulses. No carotid bruits.  Abdomen: no tenderness, no masses palpated. No hepatosplenomegaly. Bowel sounds positive.  Musculoskeletal: no clubbing / cyanosis. No joint deformity upper and lower extremities. Good ROM, no contractures. Normal muscle tone.  Skin: no rashes, lesions, ulcers. No induration Neurologic: CN 2-12 grossly intact. Sensation intact, DTR normal. Strength 5/5 in all 4.  Psychiatric:  Normal judgment and insight. Alert and oriented x 3. Normal mood.   Labs on Admission: I have personally reviewed following labs and imaging studies  CBC:  Recent Labs Lab 03/11/16 1613  WBC 12.4*  HGB 13.3  HCT 38.7*  MCV 87.2  PLT 361   Basic Metabolic Panel:  Recent Labs Lab 03/11/16 1547  NA 139  K 4.3  CL 103  CO2 25  GLUCOSE 86  BUN 10  CREATININE 1.16  CALCIUM 9.5   GFR: Estimated Creatinine Clearance: 99.2 mL/min (by C-G formula based on SCr of 1.16 mg/dL). Liver Function Tests: No results for input(s): AST, ALT, ALKPHOS, BILITOT, PROT, ALBUMIN in the last 168 hours. No results for input(s): LIPASE, AMYLASE in the last 168 hours. No results for input(s): AMMONIA in the last 168  hours. Coagulation Profile: No results for input(s): INR, PROTIME in the last 168 hours. Cardiac Enzymes:  Recent Labs Lab 03/11/16 1546  TROPONINI <0.03   BNP (last 3 results) No results for input(s): PROBNP in the last 8760 hours. HbA1C: No results for input(s): HGBA1C in the last 72 hours. CBG: No results for input(s): GLUCAP in the last 168 hours. Lipid Profile: No results for input(s): CHOL, HDL, LDLCALC, TRIG, CHOLHDL, LDLDIRECT in the last 72 hours. Thyroid Function Tests: No results for input(s): TSH, T4TOTAL, FREET4, T3FREE, THYROIDAB in the last 72 hours. Anemia Panel: No results for input(s): VITAMINB12, FOLATE, FERRITIN, TIBC, IRON, RETICCTPCT in the last 72 hours. Urine analysis:    Component Value Date/Time   COLORURINE YELLOW 10/30/2009 [place] 10/30/2009 1621   LABSPEC 1.010 10/30/2009 1621   PHURINE 6.0 10/30/2009 1621   GLUCOSEU NEGATIVE 10/30/2009 1621   HGBUR SMALL (A) 10/30/2009 1621   BILIRUBINUR n 05/11/2015 0900   KETONESUR NEGATIVE 10/30/2009 1621   PROTEINUR n 05/11/2015 0900   PROTEINUR NEGATIVE 10/30/2009 1621   UROBILINOGEN negative 05/11/2015 0900   UROBILINOGEN 1.0 10/30/2009 1621   NITRITE n 05/11/2015 0900    NITRITE NEGATIVE 10/30/2009 1621   LEUKOCYTESUR Negative 05/11/2015 0900   Sepsis Labs: '@LABRCNTIP'$ (procalcitonin:4,lacticidven:4) )No results found for this or any previous visit (from the past 240 hour(s)).   Radiological Exams on Admission: Dg Chest 2 View  Result Date: 03/11/2016 CLINICAL DATA:  Chest pain, dizziness and headaches for 3 weeks. Fever last night. EXAM: CHEST  2 VIEW COMPARISON:  PA and lateral chest 10/03/2004 and 02/11/2005. FINDINGS: The lungs are clear. Heart size is normal. No pneumothorax or pleural effusion. No bony abnormality. IMPRESSION: Negative chest. Electronically Signed   By: Inge Rise M.D.   On: 03/11/2016 16:00    EKG: Independently reviewed. Sinus rhythm.  Assessment/Plan  Principal Problem:   Chest pain - Rule out ACS - The initial trop was WNL - Cycle cardiac enzymes - Obtain 2 D ECHO - The 12 lead EKG showed sinus rhythm - Continue daily aspirin - Obtain lipid panel  Active Problems:   OSA on CPAP - Stable    Dyslipidemia - Resume Pravachol  - Check lipid panel    DVT prophylaxis: SCD's bilaterally  Code Status: full code  Family Communication: no family at the bedsdie Disposition Plan: obs, tele Consults called: none  Admission status: observation, admission for chest pain work up as his heart score is 4. We will continue to cycle cardiac enzymes and we will need to obtain 2 D ECHO. Most likely will be discharged tomorrow am if studies normal. If not, he will need cardio consultation.   Leisa Lenz MD [facility] Pager [phone]  If 7PM-7AM, please contact night-coverage [email or web address] Password [facility]  03/11/2016, 6:36 PM

## 2016-03-11 NOTE — Progress Notes (Signed)
Subjective: Chief Complaint  Patient presents with  . chest pain , lighthead , headaches    chest pain , headaches, lighthead dizness   Here for chest pain.    He notes for months he has felt more fatigue than usual, but was working more than usual as he was shorts staffed at [employer].   He has hx/o obesity, OSA on CPAP, stopped tobacco sometime this past year, noncompliance with statin, family hx/o premature CAD.  He notes in recent weeks more fatigued than usual, has had chest discomfort on and off, SOB on and off.  Has had dizziness in last few days.   Yesterday he was driving, pulled over on the side of the road and fell sleep after feeling dizzy.   He notes being lightheaded in the last week in general, has had headache, has had some left hand numbness.  He chalked up his fatigue to being out of shape, but has overall fell worse in recent days.    He drove himself here.  Wife is at home.    Past Medical History:  Diagnosis Date  . Allergic rhinitis, seasonal   . Colon polyp 02/2012   tubular adenoma, Dr. Zenovia Jarred  . De Quervain's tenosynovitis, right    hx/o  . Epididymitis    hx/o as adult  . Erectile dysfunction   . Family history of premature CAD    father  . Left inguinal hernia    tiny as of 03/2014  . MRSA (methicillin resistant staph aureus) culture positive    skin on pt's back  . Normal cardiac stress test    remote past per patient, [place], [place]  . Obesity   . OSA on CPAP   . Recurrent UTI    Dr. Janice Norrie, [facility]  . Tinea pedis   . Tobacco use    4 cigarrettes daily  . Wears glasses    Current Outpatient Prescriptions on File Prior to Visit  Medication Sig Dispense Refill  . aspirin EC 81 MG tablet Take 1 tablet (81 mg total) by mouth daily. 90 tablet 3  . fluticasone (FLONASE) 50 MCG/ACT nasal spray Place 2 sprays into both nostrils daily. 16 g 11  . sildenafil (VIAGRA) 100 MG tablet Take 0.5-1 tablets (50-100 mg total) by mouth daily as  needed for erectile dysfunction. 45 tablet 2  . pravastatin (PRAVACHOL) 20 MG tablet Take 1 tablet (20 mg total) by mouth daily. (Patient not taking: Reported on 03/11/2016) 90 tablet 1   No current facility-administered medications on file prior to visit.    Past Surgical History:  Procedure Laterality Date  . ADENOIDECTOMY    . COLONOSCOPY  02/2012   Dr. Zenovia Jarred, repeat 02/2017  . ESOPHAGEAL DILATION    . ROTATOR CUFF REPAIR  1997   left side  . SKIN SURGERY     large area of MRSA on back  . TONSILLECTOMY    . UPPER GASTROINTESTINAL ENDOSCOPY     Family History  Problem Relation Age of Onset  . Hypertension Mother   . Arthritis Mother   . Hip dysplasia Mother   . Heart disease Mother   . Heart disease Father 76    died of MI  . Hyperlipidemia Father   . HIV Brother   . Colon cancer Neg Hx   . Esophageal cancer Neg Hx   . Rectal cancer Neg Hx   . Stomach cancer Neg Hx   . Cancer Neg Hx   .  Diabetes Neg Hx   . Stroke Neg Hx      ROS as in subjective   Objective: BP 128/82   Pulse 85   Wt 279 lb 6.4 oz (126.7 kg)   SpO2 96%   BMI 38.70 kg/m   Wt Readings from Last 3 Encounters:  03/11/16 279 lb 6.4 oz (126.7 kg)  05/11/15 283 lb (128.4 kg)  04/14/14 276 lb (125.2 kg)   BP Readings from Last 3 Encounters:  03/11/16 128/82  05/11/15 120/78  02/06/15 122/78   General appearance: alert, no distress, WD/WN, obese AA male Mildly diaphoretic Neck: supple, no lymphadenopathy, no thyromegaly, no masses, no bruits Heart: faint 1-2/6 murmur upper sternal borders otherwise RRR Lungs: CTA bilaterally, no wheezes, rhonchi, or rales Abdomen: +bs, soft, non tender, non distended, no masses, no hepatomegaly, no splenomegaly Pulses: 2+ symmetric, upper and lower extremities, normal cap refill Ext: No edema    Adult ECG Report  Indication: chest pain, syncope, sob, angina  Rate: 79 bpm  Rhythm: normal sinus rhythm  QRS Axis: 70 degrees  PR Interval: 132m  QRS  Duration: 965m QTc: 42451mConduction Disturbances: none  Other Abnormalities: ST elevation V2, V3, LVH,   Patient's cardiac risk factors are: advanced age (older than 55 37r men, 65 32r women), dyslipidemia, family history of premature cardiovascular disease, male gender and obesity (BMI >= 30 kg/m2).  EKG comparison: none  Narrative Interpretation: ST elevation?, LVH, abnormal EKG    Assessment: Encounter Diagnoses  Name Primary?  . Chest pain, unspecified type Yes  . [place]stable angina ([place] . SOB (shortness of breath)   . Syncope, unspecified syncope type   . Dizziness   . OSA on CPAP   . Former smoker   . Family history of early CAD   . Class 3 obesity with serious comorbidity in adult, unspecified BMI, unspecified obesity type      Plan: discussed his symptoms concerning for unstable angina, ischemia, syncope.   EKG showed some nonspecific abnormalities, but given worrisome symptoms, needs urgent eval.  I recommended EMS transport to eval urgently.   He was in agreement.  I called his wife to advised at his request.      Roger Mendoza seen today for chest pain , lighthead , headaches.  Diagnoses and all orders for this visit:  Chest pain, unspecified type -     EKG 12-Lead -     Pulse oximetry (single); Future  Unstable angina (HCC) -     EKG 12-Lead -     Pulse oximetry (single); Future  SOB (shortness of breath) -     EKG 12-Lead -     Pulse oximetry (single); Future  Syncope, unspecified syncope type -     EKG 12-Lead -     Pulse oximetry (single); Future  Dizziness -     EKG 12-Lead -     Pulse oximetry (single); Future  OSA on CPAP -     EKG 12-Lead -     Pulse oximetry (single); Future  Former smoker -     EKG 12-Lead -     Pulse oximetry (single); Future  Family history of early CAD -     EKG 12-Lead -     Pulse oximetry (single); Future  Class 3 obesity with serious comorbidity in adult, unspecified BMI, unspecified obesity type -     EKG  12-Lead -     Pulse oximetry (single); Future

## 2016-03-11 NOTE — ED Triage Notes (Signed)
Per EMS, vague story, pt from family medicine with complaint of intermittent left sided chest pain with some radiation to the neck, worse with palpation x 3 weeks. Denies any other sx. VSS. 156/84, 84 NSR, 98% RA, pt given 324 asa by family medicine. Pt sent for EKG change from previous.

## 2016-03-11 NOTE — ED Notes (Signed)
Pt transported to xray 

## 2016-03-11 NOTE — ED Provider Notes (Signed)
[facility] DEPT Provider Note   CSN: [id number] Arrival date & time: 03/11/16  1525     History   Chief Complaint Chief Complaint  Patient presents with  . Chest Pain    HPI Roger Mendoza is a 56 y.o. male.  HPI    56 year old male presents today with numerous complaints.  Patient notes generalized fatigue over the last several months.  He notes over the last several weeks he has had pain in his neck and shoulders that feels like a "ball".  He notes he started to develop off-and-on chest pain approximately 3 weeks ago.  He describes this as pressure as if "somebody is pushing on my chest".  He notes this chest pain gets worse with exertion.  He also notes a different type of chest pain, described as sharp in nature, worse with upper torso movements or palpation. Pt reports pain was improved with ASA prior to arrival.   Patient also notes that over the last week he has had dizziness, reports this often happens when he is walking, sometimes with sitting.  Patient notes rapid heart rate with this, and feels like he is going to pass out.  He reports seeing black spots.   Patient has a history of obesity, obstructive sleep apnea, CPAP, stop tobacco last year after smoking for approximately 30 years, noncompliant with a statin, history of premature CAD in his family; father died of a heart attack in his 6s.  Patient notes that he was followed by cardiology several years ago, but cannot recall the cardiologist's name.  Past Medical History:  Diagnosis Date  . Allergic rhinitis, seasonal   . Colon polyp 02/2012   tubular adenoma, Dr. Zenovia Jarred  . De Quervain's tenosynovitis, right    hx/o  . Epididymitis    hx/o as adult  . Erectile dysfunction   . Family history of premature CAD    father  . Left inguinal hernia    tiny as of 03/2014  . MRSA (methicillin resistant staph aureus) culture positive    skin on pt's back  . Normal cardiac stress test    remote past per patient,  [place], [place]  . Obesity   . OSA on CPAP   . Recurrent UTI    Dr. Janice Norrie, [facility]  . Tinea pedis   . Tobacco use    4 cigarrettes daily  . Wears glasses     Patient Active Problem List   Diagnosis Date Noted  . Chest pain 03/11/2016  . Dyslipidemia 03/11/2016  . OSA on CPAP 03/11/2010  . TOBACCO ABUSE 07/24/2009    Past Surgical History:  Procedure Laterality Date  . ADENOIDECTOMY    . COLONOSCOPY  02/2012   Dr. Zenovia Jarred, repeat 02/2017  . ESOPHAGEAL DILATION    . ROTATOR CUFF REPAIR  1997   left side  . SKIN SURGERY     large area of MRSA on back  . TONSILLECTOMY    . UPPER GASTROINTESTINAL ENDOSCOPY       Home Medications    Prior to Admission medications   Medication Sig Start Date End Date Taking? Authorizing Provider  aspirin EC 81 MG tablet Take 1 tablet (81 mg total) by mouth daily. 05/12/15  Yes Camelia Eng Tysinger, PA-C  fluticasone (FLONASE) 50 MCG/ACT nasal spray Place 2 sprays into both nostrils daily. 05/11/15  Yes Camelia Eng Tysinger, PA-C  pravastatin (PRAVACHOL) 20 MG tablet Take 1 tablet (20 mg total) by mouth daily. 05/12/15  Yes  Camelia Eng Tysinger, PA-C  sildenafil (VIAGRA) 100 MG tablet Take 0.5-1 tablets (50-100 mg total) by mouth daily as needed for erectile dysfunction. 05/11/15  Yes Carlena Hurl, PA-C    Family History Family History  Problem Relation Age of Onset  . Hypertension Mother   . Arthritis Mother   . Hip dysplasia Mother   . Heart disease Mother   . Heart disease Father 40    died of MI  . Hyperlipidemia Father   . HIV Brother   . Colon cancer Neg Hx   . Esophageal cancer Neg Hx   . Rectal cancer Neg Hx   . Stomach cancer Neg Hx   . Cancer Neg Hx   . Diabetes Neg Hx   . Stroke Neg Hx     Social History Social History  Substance Use Topics  . Smoking status: Former Smoker    Packs/day: 0.25    Types: Cigarettes    Quit date: 03/31/2014  . Smokeless tobacco: Never Used  . Alcohol use 7.2 oz/week    12 Cans of beer  per week     Comment: couple glasses of wine per day     Allergies   Patient has no known allergies.   Review of Systems Review of Systems  All other systems reviewed and are negative.    Physical Exam Updated Vital Signs BP 129/68   Pulse 76   Temp 98.5 F (36.9 C) (Oral)   Resp 20   Ht '6\' 1"'$  (1.854 m)   Wt 126.6 kg   SpO2 99%   BMI 36.81 kg/m   Physical Exam  Constitutional: He is oriented to person, place, and time. He appears well-developed and well-nourished.  HENT:  Head: Normocephalic and atraumatic.  Eyes: Conjunctivae are normal. Pupils are equal, round, and reactive to light. Right eye exhibits no discharge. Left eye exhibits no discharge. No scleral icterus.  Neck: Normal range of motion. No JVD present. No tracheal deviation present.  Cardiovascular: Normal rate, regular rhythm and normal heart sounds.   Pulmonary/Chest: Effort normal. No stridor. No respiratory distress. He has no wheezes. He has no rales. He exhibits no tenderness.  Abdominal: Soft.  Musculoskeletal: Normal range of motion. He exhibits no edema.  Neurological: He is alert and oriented to person, place, and time. No cranial nerve deficit. Coordination normal.  Psychiatric: He has a normal mood and affect. His behavior is normal. Judgment and thought content normal.  Nursing note and vitals reviewed.    ED Treatments / Results  Labs (all labs ordered are listed, but only abnormal results are displayed) Labs Reviewed  CBC - Abnormal; Notable for the following:       Result Value   WBC 12.4 (*)    HCT 38.7 (*)    All other components within normal limits  BASIC METABOLIC PANEL  TROPONIN I    EKG  EKG Interpretation  Date/Time:  Friday March 11 2016 15:29:57 EST Ventricular Rate:  79 PR Interval:    QRS Duration: 90 QT Interval:  357 QTC Calculation: 410 R Axis:   71 Text Interpretation:  Sinus rhythm Left ventricular hypertrophy ST elevation anteriorly similar to 2007  Confirmed by GOLDSTON MD, [place] [phone]) on 03/11/2016 3:52:39 PM       Radiology Dg Chest 2 View  Result Date: 03/11/2016 CLINICAL DATA:  Chest pain, dizziness and headaches for 3 weeks. Fever last night. EXAM: CHEST  2 VIEW COMPARISON:  PA and lateral chest 10/03/2004 and 02/11/2005.  FINDINGS: The lungs are clear. Heart size is normal. No pneumothorax or pleural effusion. No bony abnormality. IMPRESSION: Negative chest. Electronically Signed   By: Inge Rise M.D.   On: 03/11/2016 16:00    Procedures Procedures (including critical care time)  Medications Ordered in ED Medications - No data to display   Initial Impression / Assessment and Plan / ED Course  I have reviewed the triage vital signs and the nursing notes.  Pertinent labs & imaging results that were available during my care of the patient were reviewed by me and considered in my medical decision making (see chart for details).      Final Clinical Impressions(s) / ED Diagnoses   Final diagnoses:  Chest pain, unspecified type    Labs: CBC, BMP, troponin  Imaging: Chest 2 view  Consults:  Therapeutics: Aspirin prior to arrival  Discharge Meds:    Assessment/Plan: 56 year old male presents today with numerous complaints.  Patient reporting chest pain worse with exertion.  Concerning for unstable angina.  Patient's initial EKG shows no significant findings, initial troponin negative.  Due to significant risk factors and concerning story hospitalist service consulted for ACS rule out.    New Prescriptions New Prescriptions   No medications on file     Okey Regal, PA-C 03/11/16 1831    Sherwood Gambler, MD 03/17/16 1556

## 2016-03-11 NOTE — Progress Notes (Signed)
Patient arrived to 2w16. Tele box placed, verified x2. Pt denies any current chest pain or SOB. Pt updated on plan of care. Oriented to room and call bell. Will continue to monitor.   Jaymes Graff, RN

## 2016-03-12 ENCOUNTER — Observation Stay (HOSPITAL_BASED_OUTPATIENT_CLINIC_OR_DEPARTMENT_OTHER): Payer: 59

## 2016-03-12 DIAGNOSIS — E785 Hyperlipidemia, unspecified: Secondary | ICD-10-CM | POA: Diagnosis not present

## 2016-03-12 DIAGNOSIS — R079 Chest pain, unspecified: Secondary | ICD-10-CM

## 2016-03-12 DIAGNOSIS — G4733 Obstructive sleep apnea (adult) (pediatric): Secondary | ICD-10-CM | POA: Diagnosis not present

## 2016-03-12 LAB — HIV ANTIBODY (ROUTINE TESTING W REFLEX): HIV SCREEN 4TH GENERATION: NONREACTIVE

## 2016-03-12 LAB — ECHOCARDIOGRAM COMPLETE
HEIGHTINCHES: 73 in
Weight: 4456 oz

## 2016-03-12 LAB — LIPID PANEL
CHOLESTEROL: 175 mg/dL (ref 0–200)
HDL: 32 mg/dL — ABNORMAL LOW (ref >40)
LDL CALC: 65 mg/dL (ref 0–99)
TRIGLYCERIDES: 389 mg/dL — AB (ref <150)
Total CHOL/HDL Ratio: 5.5 RATIO
VLDL: 78 mg/dL — ABNORMAL HIGH (ref 0–40)

## 2016-03-12 LAB — TROPONIN I: Troponin I: <0.03 ng/mL (ref <0.03)

## 2016-03-12 NOTE — Discharge Instructions (Addendum)
Chest Wall Pain °Chest wall pain is pain in or around the bones and muscles of your chest. Sometimes, an injury causes this pain. Sometimes, the cause may not be known. This pain may take several weeks or longer to get better. °Follow these instructions at home: °Pay attention to any changes in your symptoms. Take these actions to help with your pain: °· Rest as told by your health care provider. °· Avoid activities that cause pain. These include any activities that use your chest muscles or your abdominal and side muscles to lift heavy items. °· If directed, apply ice to the painful area: °¨ Put ice in a plastic bag. °¨ Place a towel between your skin and the bag. °¨ Leave the ice on for 20 minutes, 2-3 times per day. °· Take over-the-counter and prescription medicines only as told by your health care provider. °· Do not use tobacco products, including cigarettes, chewing tobacco, and e-cigarettes. If you need help quitting, ask your health care provider. °· Keep all follow-up visits as told by your health care provider. This is important. °Contact a health care provider if: °· You have a fever. °· Your chest pain becomes worse. °· You have new symptoms. °Get help right away if: °· You have nausea or vomiting. °· You feel sweaty or light-headed. °· You have a cough with phlegm (sputum) or you cough up blood. °· You develop shortness of breath. °This information is not intended to replace advice given to you by your health care provider. Make sure you discuss any questions you have with your health care provider. °Document Released: 01/10/2005 Document Revised: 05/21/2015 Document Reviewed: 04/07/2014 °Elsevier Interactive Patient Education © 2017 [employer] ° °

## 2016-03-12 NOTE — Progress Notes (Signed)
Pt. Discharged to home with wife Pt. D/C'd via wheelchair with RN Discharge information reviewed and given All personal belongings given to Pt.  Education discussed wit teach-back IV was d/c Tele d/c

## 2016-03-12 NOTE — Discharge Summary (Signed)
Physician Discharge Summary  TERREZ ANDER BJS:[id number] DOB: Jun 06, 1960 DOA: 03/11/2016  PCP: Crisoforo Oxford, PA-C  Admit date: 03/11/2016 Discharge date: 03/12/2016  Recommendations for Outpatient Follow-up:  1. No changes in meds on discharge   Discharge Diagnoses:  Principal Problem:   Chest pain Active Problems:   TOBACCO ABUSE   OSA on CPAP   Dyslipidemia    Discharge Condition: stable   Diet recommendation: as tolerated   History of present illness:  56 y.o. male with medical history significant dyslipidemia who presented with mid sternal chest pain started at today, sharp, intermittent and radiating to left arm and jaw and feeling as if there is a lump in the neck. Pain was more prominent with exertion and better with rest. He also reported intermittent shortness of breath. He took his daily aspirin but there was no significant improvement in pain. No associated nausea or vomiting. No abdominal pain. No fevers or chills or cough.   ED Course: In ED, pt was hemodynamically stable. Blood work was notable for mild leukocytosis of 12.4 otherwise unremarkable. Troponin was WNL. The 12 lead EKG showed sinus rhythm. CXR showed no acute cardiopulmonary findings. Pt admitted for chest pain evaluation as his heart score was 4.  Hospital Course:  Principal Problem:   Chest pain - Trop WNL - 2 D ECHO is pending  - The 12 lead EKG showed sinus rhythm - Continue daily aspirin and Pravachol   Active Problems:   OSA on CPAP - Stable    Dyslipidemia - Resume Pravachol  - LDL 65   DVT prophylaxis: SCD's bilaterally  Code Status: full code  Family Communication: wife at the bedsdie   Signed:  Leisa Lenz, MD  [facility] 03/12/2016, 1:44 PM  Pager #: [phone]  Time spent in minutes: less than 30 minutes  Procedures:  ECHO  Consultations:  None   Discharge Exam: Vitals:   03/11/16 2342 03/12/16 0439  BP: (!) 120/57 125/67  Pulse:  83 79  Resp: 18 20  Temp: 98.6 F (37 C) 98.6 F (37 C)   Vitals:   03/11/16 2010 03/11/16 2302 03/11/16 2342 03/12/16 0439  BP: 134/69  (!) 120/57 125/67  Pulse: 75 85 83 79  Resp: '16 18 18 20  '$ Temp: 98.7 F (37.1 C)  98.6 F (37 C) 98.6 F (37 C)  TempSrc: Oral  Oral Axillary  SpO2: 98% 96% 94% 98%  Weight: 126.3 kg (278 lb 8 oz)     Height: '6\' 1"'$  (1.854 m)       General: Pt is alert, follows commands appropriately, not in acute distress Cardiovascular: Regular rate and rhythm, S1/S2 +, no murmurs Respiratory: Clear to auscultation bilaterally, no wheezing, no crackles, no rhonchi Abdominal: Soft, non tender, non distended, bowel sounds +, no guarding Extremities: no edema, no cyanosis, pulses palpable bilaterally DP and PT Neuro: Grossly nonfocal  Discharge Instructions  Discharge Instructions    Call MD for:  persistant nausea and vomiting    Complete by:  As directed    Call MD for:  redness, tenderness, or signs of infection (pain, swelling, redness, odor or green/yellow discharge around incision site)    Complete by:  As directed    Call MD for:  severe uncontrolled pain    Complete by:  As directed    Diet - low sodium heart healthy    Complete by:  As directed    Discharge instructions    Complete by:  As directed  Please call [facility], phone number provided on discharge summary   Increase activity slowly    Complete by:  As directed      Allergies as of 03/12/2016   No Known Allergies     Medication List    TAKE these medications   aspirin EC 81 MG tablet Take 1 tablet (81 mg total) by mouth daily.   fluticasone 50 MCG/ACT nasal spray Commonly known as:  FLONASE Place 2 sprays into both nostrils daily.   pravastatin 20 MG tablet Commonly known as:  PRAVACHOL Take 1 tablet (20 mg total) by mouth daily.   sildenafil 100 MG tablet Commonly known as:  VIAGRA Take 0.5-1 tablets (50-100 mg total) by mouth daily as needed for erectile  dysfunction.         The results of significant diagnostics from this hospitalization (including imaging, microbiology, ancillary and laboratory) are listed below for reference.    Significant Diagnostic Studies: Dg Chest 2 View  Result Date: 03/11/2016 CLINICAL DATA:  Chest pain, dizziness and headaches for 3 weeks. Fever last night. EXAM: CHEST  2 VIEW COMPARISON:  PA and lateral chest 10/03/2004 and 02/11/2005. FINDINGS: The lungs are clear. Heart size is normal. No pneumothorax or pleural effusion. No bony abnormality. IMPRESSION: Negative chest. Electronically Signed   By: Inge Rise M.D.   On: 03/11/2016 16:00    Microbiology: Recent Results (from the past 240 hour(s))  MRSA PCR Screening     Status: None   Collection Time: 03/11/16  8:42 PM  Result Value Ref Range Status   MRSA by PCR NEGATIVE NEGATIVE Final    Comment:        The GeneXpert MRSA Assay (FDA approved for NASAL specimens only), is one component of a comprehensive MRSA colonization surveillance program. It is not intended to diagnose MRSA infection nor to guide or monitor treatment for MRSA infections.      Labs: Basic Metabolic Panel:  Recent Labs Lab 03/11/16 1547  NA 139  K 4.3  CL 103  CO2 25  GLUCOSE 86  BUN 10  CREATININE 1.16  CALCIUM 9.5   Liver Function Tests: No results for input(s): AST, ALT, ALKPHOS, BILITOT, PROT, ALBUMIN in the last 168 hours. No results for input(s): LIPASE, AMYLASE in the last 168 hours. No results for input(s): AMMONIA in the last 168 hours. CBC:  Recent Labs Lab 03/11/16 1613  WBC 12.4*  HGB 13.3  HCT 38.7*  MCV 87.2  PLT 257   Cardiac Enzymes:  Recent Labs Lab 03/11/16 1546 03/11/16 2030 03/12/16 0245 03/12/16 0835  TROPONINI <0.03 <0.03 <0.03 <0.03   BNP: BNP (last 3 results) No results for input(s): BNP in the last 8760 hours.  ProBNP (last 3 results) No results for input(s): PROBNP in the last 8760 hours.  CBG: No results  for input(s): GLUCAP in the last 168 hours.

## 2016-03-13 ENCOUNTER — Other Ambulatory Visit: Payer: Self-pay | Admitting: Medical

## 2016-04-07 ENCOUNTER — Encounter: Payer: Self-pay | Admitting: Internal Medicine

## 2016-04-17 NOTE — Progress Notes (Signed)
Cardiology Office Note   Date:  04/18/2016   ID:  Roger Mendoza, DOB 10-Feb-1960, MRN [id number]  PCP:  Crisoforo Oxford, PA-C  Cardiologist:   Dorris Carnes, MD   Pt referred for CP     History of Present Illness: Roger Mendoza is a 56 y.o. male with a history of hyperlipidemia    PT was admitted in 2008 with CP and ST elevaoin anteriorly   Cath in 2007luminal irregularieis  R Dominant.  LVEF normal   Admttied n Feb with CP   R/O  Echo showed normal LVEF   Mild LVH      On review of chest discomfort-- describes as chest pressure   Comes and goes   Not associated with activity A bubble sensation  No SOB  No association with eating   MIld discomorth the other day  Not bad  Walks about 1 mile per day    Current Meds  Medication Sig  . aspirin EC 81 MG tablet Take 1 tablet (81 mg total) by mouth daily.  . fluticasone (FLONASE) 50 MCG/ACT nasal spray Place 2 sprays into both nostrils daily.  . NON FORMULARY C PAP  . PAZEO 0.7 % SOLN Place 1 drop into both eyes daily.   . pravastatin (PRAVACHOL) 20 MG tablet TAKE 1 TABLET (20 MG TOTAL) BY MOUTH DAILY.  . sildenafil (VIAGRA) 100 MG tablet Take 0.5-1 tablets (50-100 mg total) by mouth daily as needed for erectile dysfunction.     Allergies:   Patient has no known allergies.   Past Medical History:  Diagnosis Date  . Allergic rhinitis, seasonal   . Colon polyp 02/2012   tubular adenoma, Dr. Zenovia Jarred  . De Quervain's tenosynovitis, right    hx/o  . Epididymitis    hx/o as adult  . Erectile dysfunction   . Family history of premature CAD    father  . Left inguinal hernia    tiny as of 03/2014  . MRSA (methicillin resistant staph aureus) culture positive    skin on pt's back  . Normal cardiac stress test    remote past per patient, [place], [place]  . Obesity   . OSA on CPAP   . Recurrent UTI    Dr. Janice Norrie, [facility]  . Tinea pedis   . Tobacco use    4 cigarrettes daily  . Wears glasses     Past  Surgical History:  Procedure Laterality Date  . ADENOIDECTOMY    . COLONOSCOPY  02/2012   Dr. Zenovia Jarred, repeat 02/2017  . ESOPHAGEAL DILATION    . ROTATOR CUFF REPAIR  1997   left side  . SKIN SURGERY     large area of MRSA on back  . TONSILLECTOMY    . UPPER GASTROINTESTINAL ENDOSCOPY       Social History:  The patient  reports that he quit smoking about 2 years ago. His smoking use included Cigarettes. He smoked 0.25 packs per day. He has never used smokeless tobacco. He reports that he drinks about 7.2 oz of alcohol per week . He reports that he does not use drugs.   Family History:  The patient's family history includes Arthritis in his mother; HIV in his brother; Heart disease in his mother; Heart disease (age of onset: 68) in his father; Hip dysplasia in his mother; Hyperlipidemia in his father; Hypertension in his mother.  Father died of MI at 66  Mother had HTN  Paternal side of  family with CAD    ROS:  Please see the history of present illness. All other systems are reviewed and  Negative to the above problem except as noted.    PHYSICAL EXAM: VS:  BP 128/78   Pulse 98   Ht '6\' 1"'$  (1.854 m)   Wt 286 lb 12.8 oz (130.1 kg)   SpO2 96%   BMI 37.84 kg/m   GEN: Well nourished, well developed, in no acute distress  HEENT: normal  Neck: no JVD, carotid bruits, or masses Cardiac: RRR; no murmurs, rubs, or gallops,no edema  Respiratory:  clear to auscultation bilaterally, normal work of breathing GI: soft, nontender, nondistended, + BS  No hepatomegaly  MS: no deformity Moving all extremities   Skin: warm and dry, no rash Neuro:  Strength and sensation are intact Psych: euthymic mood, full affect   EKG:  EKG is not ordered today.  ON 2/16:  SR  79 bpm   LVH  Nonspecific ST changes    Lipid Panel    Component Value Date/Time   CHOL 175 03/12/2016 0245   TRIG 389 (H) 03/12/2016 0245   HDL 32 (L) 03/12/2016 0245   CHOLHDL 5.5 03/12/2016 0245   VLDL 78 (H) 03/12/2016  0245   LDLCALC 65 03/12/2016 0245   LDLDIRECT 142 (H) 01/04/2012 1628      Wt Readings from Last 3 Encounters:  04/18/16 286 lb 12.8 oz (130.1 kg)  03/11/16 278 lb 8 oz (126.3 kg)  03/11/16 279 lb 6.4 oz (126.7 kg)      ASSESSMENT AND PLAN:  1  CP  Atypical  I am not convinced cardiac in origin  ? GI   I would not plan furhter testing unless symptoms change  Discussed with pt   2  HL  Would continue on Pravastatin  3  Morbid obesity  Encouraged increased exercise and diet to decreae wt     Will be available as needed in future if symptoms change  No definite f/u planned    Current medicines are reviewed at length with the patient today.  The patient does not have concerns regarding medicines.  Signed, Dorris Carnes, MD  04/18/2016 4:04 PM    [facility] [address], [place], [place]  [address] Phone: [phone]; Fax: [phone]

## 2016-04-18 ENCOUNTER — Encounter: Payer: Self-pay | Admitting: Internal Medicine

## 2016-04-18 ENCOUNTER — Ambulatory Visit (INDEPENDENT_AMBULATORY_CARE_PROVIDER_SITE_OTHER): Payer: 59 | Admitting: Internal Medicine

## 2016-04-18 VITALS — BP 128/78 | HR 98 | Ht 73.0 in | Wt 286.8 lb

## 2016-04-18 DIAGNOSIS — R079 Chest pain, unspecified: Secondary | ICD-10-CM | POA: Diagnosis not present

## 2016-04-18 DIAGNOSIS — E785 Hyperlipidemia, unspecified: Secondary | ICD-10-CM | POA: Diagnosis not present

## 2016-04-18 NOTE — Patient Instructions (Signed)
Your physician recommends that you continue on your current medications as directed. Please refer to the Current Medication list given to you today.  Your physician recommends that you schedule a follow-up appointment as needed.  Please call if your symptoms change.

## 2016-05-11 ENCOUNTER — Ambulatory Visit (INDEPENDENT_AMBULATORY_CARE_PROVIDER_SITE_OTHER): Payer: 59 | Admitting: Medical

## 2016-05-11 ENCOUNTER — Encounter: Payer: Self-pay | Admitting: Medical

## 2016-05-11 VITALS — BP 110/70 | HR 66 | Ht 71.0 in | Wt 280.8 lb

## 2016-05-11 DIAGNOSIS — Z9989 Dependence on other enabling machines and devices: Secondary | ICD-10-CM

## 2016-05-11 DIAGNOSIS — Z87891 Personal history of nicotine dependence: Secondary | ICD-10-CM | POA: Insufficient documentation

## 2016-05-11 DIAGNOSIS — R232 Flushing: Secondary | ICD-10-CM

## 2016-05-11 DIAGNOSIS — R5383 Other fatigue: Secondary | ICD-10-CM | POA: Diagnosis not present

## 2016-05-11 DIAGNOSIS — E785 Hyperlipidemia, unspecified: Secondary | ICD-10-CM

## 2016-05-11 DIAGNOSIS — G4733 Obstructive sleep apnea (adult) (pediatric): Secondary | ICD-10-CM

## 2016-05-11 DIAGNOSIS — N529 Male erectile dysfunction, unspecified: Secondary | ICD-10-CM | POA: Diagnosis not present

## 2016-05-11 DIAGNOSIS — K644 Residual hemorrhoidal skin tags: Secondary | ICD-10-CM

## 2016-05-11 DIAGNOSIS — Z1159 Encounter for screening for other viral diseases: Secondary | ICD-10-CM

## 2016-05-11 DIAGNOSIS — Z0000 Encounter for general adult medical examination without abnormal findings: Secondary | ICD-10-CM

## 2016-05-11 DIAGNOSIS — L989 Disorder of the skin and subcutaneous tissue, unspecified: Secondary | ICD-10-CM | POA: Diagnosis not present

## 2016-05-11 LAB — CBC
HCT: 41.8 % (ref 38.5–50.0)
HEMOGLOBIN: 14 g/dL (ref 13.2–17.1)
MCH: 29.8 pg (ref 27.0–33.0)
MCHC: 33.5 g/dL (ref 32.0–36.0)
MCV: 88.9 fL (ref 80.0–100.0)
MPV: 10.3 fL (ref 7.5–12.5)
Platelets: 309 10*3/uL (ref 140–400)
RBC: 4.7 MIL/uL (ref 4.20–5.80)
RDW: 13.1 % (ref 11.0–15.0)
WBC: 7.6 10*3/uL (ref 4.0–10.5)

## 2016-05-11 LAB — POCT URINALYSIS DIPSTICK
Bilirubin, UA: NEGATIVE
Glucose, UA: NEGATIVE
Ketones, UA: NEGATIVE
Leukocytes, UA: NEGATIVE
Nitrite, UA: NEGATIVE
PROTEIN UA: NEGATIVE
RBC UA: NEGATIVE
SPEC GRAV UA: 1.025 (ref 1.010–1.025)
UROBILINOGEN UA: NEGATIVE U/dL — AB
pH, UA: 6 (ref 5.0–8.0)

## 2016-05-11 LAB — TSH: TSH: 1.41 mIU/L (ref 0.40–4.50)

## 2016-05-11 MED ORDER — HYDROCORTISONE 2.5 % RE CREA: 1.0000 "application " | TOPICAL_CREAM | Freq: Two times a day (BID) | RECTAL | 0 refills | Status: DC

## 2016-05-11 MED ORDER — AVANAFIL 200 MG PO TABS: 1.0000 | ORAL_TABLET | Freq: Every day | ORAL | 0 refills | Status: DC | PRN

## 2016-05-11 NOTE — Patient Instructions (Addendum)
Recommendations:  I would like to see you lose about 20 lb in the next 3 months  Work on eating a healthy low fat diet  Exercise daily See your eye doctor yearly for routine vision care.  See your dentist yearly for routine dental care including hygiene visits twice yearly.  We will check labs today given the hot flashes and fatigue I recommend you have a shingles vaccine to help prevent shingles or herpes zoster outbreak.   Please call your insurer to inquire about coverage for the Shingrix vaccine given in 2 doses.   Some insurers cover this vaccine after age 49, some cover this after age 77.  If your insurer covers this, then call to schedule appointment to have this vaccine here. Get a yearly flu shot Continue your CPAP use You will be due for colonoscopy next year   Hemorrhoids Hemorrhoids are swollen veins in and around the rectum or anus. There are two types of hemorrhoids:  Internal hemorrhoids. These occur in the veins that are just inside the rectum. They may poke through to the outside and become irritated and painful.  External hemorrhoids. These occur in the veins that are outside of the anus and can be felt as a painful swelling or hard lump near the anus. Most hemorrhoids do not cause serious problems, and they can be managed with home treatments such as diet and lifestyle changes. If home treatments do not help your symptoms, procedures can be done to shrink or remove the hemorrhoids. What are the causes? This condition is caused by increased pressure in the anal area. This pressure may result from various things, including:  Constipation.  Straining to have a bowel movement.  Diarrhea.  Pregnancy.  Obesity.  Sitting for long periods of time.  Heavy lifting or other activity that causes you to strain.  Anal sex. What are the signs or symptoms? Symptoms of this condition include:  Pain.  Anal itching or irritation.  Rectal bleeding.  Leakage of stool  (feces).  Anal swelling.  One or more lumps around the anus. How is this diagnosed? This condition can often be diagnosed through a visual exam. Other exams or tests may also be done, such as:  Examination of the rectal area with a gloved hand (digital rectal exam).  Examination of the anal canal using a small tube (anoscope).  A blood test, if you have lost a significant amount of blood.  A test to look inside the colon (sigmoidoscopy or colonoscopy). How is this treated? This condition can usually be treated at home. However, various procedures may be done if dietary changes, lifestyle changes, and other home treatments do not help your symptoms. These procedures can help make the hemorrhoids smaller or remove them completely. Some of these procedures involve surgery, and others do not. Common procedures include:  Rubber band ligation. Rubber bands are placed at the base of the hemorrhoids to cut off the blood supply to them.  Sclerotherapy. Medicine is injected into the hemorrhoids to shrink them.  Infrared coagulation. A type of light energy is used to get rid of the hemorrhoids.  Hemorrhoidectomy surgery. The hemorrhoids are surgically removed, and the veins that supply them are tied off.  Stapled hemorrhoidopexy surgery. A circular stapling device is used to remove the hemorrhoids and use staples to cut off the blood supply to them. Follow these instructions at home: Eating and drinking   Eat foods that have a lot of fiber in them, such as whole grains,  beans, nuts, fruits, and vegetables. Ask your health care provider about taking products that have added fiber (fiber supplements).  Drink enough fluid to keep your urine clear or pale yellow. Managing pain and swelling   Take warm sitz baths for 20 minutes, 3-4 times a day to ease pain and discomfort.  If directed, apply ice to the affected area. Using ice packs between sitz baths may be helpful.  Put ice in a plastic  bag.  Place a towel between your skin and the bag.  Leave the ice on for 20 minutes, 2-3 times a day. General instructions   Take over-the-counter and prescription medicines only as told by your health care provider.  Use medicated creams or suppositories as told.  Exercise regularly.  Go to the bathroom when you have the urge to have a bowel movement. Do not wait.  Avoid straining to have bowel movements.  Keep the anal area dry and clean. Use wet toilet paper or moist towelettes after a bowel movement.  Do not sit on the toilet for long periods of time. This increases blood pooling and pain. Contact a health care provider if:  You have increasing pain and swelling that are not controlled by treatment or medicine.  You have uncontrolled bleeding.  You have difficulty having a bowel movement, or you are unable to have a bowel movement.  You have pain or inflammation outside the area of the hemorrhoids. This information is not intended to replace advice given to you by your health care provider. Make sure you discuss any questions you have with your health care provider. Document Released: 01/08/2000 Document Revised: 06/10/2015 Document Reviewed: 09/24/2014 Elsevier Interactive Patient Education  2017 [employer].

## 2016-05-11 NOTE — Progress Notes (Signed)
Subjective:   HPI  Roger Mendoza is a 56 y.o. male who presents for physical Chief Complaint  Patient presents with  . Annual Exam    physical, having hot flashes , discuss tst levels    Medical care team includes: Crisoforo Oxford, PA-C here for primary care Dentist Eye doctor Dr. Janice Norrie, urology  Concerns: Has low energy , hot flashes a few times per week.  Wants TST checked  Reviewed their medical, surgical, family, social, medication, and allergy history and updated chart as appropriate.  Past Medical History:  Diagnosis Date  . Allergic rhinitis, seasonal   . Colon polyp 02/2012   tubular adenoma, Dr. Zenovia Jarred  . De Quervain's tenosynovitis, right    hx/o  . Epididymitis    hx/o as adult  . Erectile dysfunction   . Family history of premature CAD    father  . Hyperlipidemia   . Left inguinal hernia    tiny as of 03/2014  . MRSA (methicillin resistant staph aureus) culture positive    history of MRSA skin on pt's back  . Normal cardiac stress test    remote past per patient, [place], [place]  . Obesity   . OSA on CPAP   . Recurrent UTI    Dr. Janice Norrie, [facility]  . Tinea pedis   . Tobacco use    4 cigarrettes daily  . Wears glasses     Past Surgical History:  Procedure Laterality Date  . ADENOIDECTOMY    . COLONOSCOPY  02/2012   Dr. Zenovia Jarred, repeat 02/2017  . ESOPHAGEAL DILATION    . ROTATOR CUFF REPAIR  1997   left side  . SKIN SURGERY     large area of MRSA on back  . TONSILLECTOMY    . UPPER GASTROINTESTINAL ENDOSCOPY      Social History   Social History  . Marital status: Divorced    Spouse name: N/A  . Number of children: N/A  . Years of education: N/A   Occupational History  . loads truck    Social History Main Topics  . Smoking status: Former Smoker    Packs/day: 0.25    Years: 15.00    Types: Cigarettes    Quit date: 03/31/2014  . Smokeless tobacco: Never Used  . Alcohol use 3.0 oz/week    5 Glasses of wine per week      Comment: couple glasses of wine per day  . Drug use: No  . Sexual activity: Not on file   Other Topics Concern  . Not on file   Social History Narrative   Married, has 66yo son, works for [employer] at [employer], loading trucks, exercise - walks a lot at work, diet - has cut back on sweets, bread and greasy foods.  Plays golf, used to play softball.  Attends church, deacon.  04/2016    Family History  Problem Relation Age of Onset  . Hypertension Mother   . Arthritis Mother   . Hip dysplasia Mother   . Heart disease Mother   . Heart disease Father 66    died of MI  . Hyperlipidemia Father   . HIV Brother   . Colon cancer Neg Hx   . Esophageal cancer Neg Hx   . Rectal cancer Neg Hx   . Stomach cancer Neg Hx   . Cancer Neg Hx   . Diabetes Neg Hx   . Stroke Neg Hx      Current Outpatient Prescriptions:  .  aspirin EC 81 MG tablet, Take 1 tablet (81 mg total) by mouth daily., Disp: 90 tablet, Rfl: 3 .  fluticasone (FLONASE) 50 MCG/ACT nasal spray, Place 2 sprays into both nostrils daily., Disp: 16 g, Rfl: 11 .  NON FORMULARY, C PAP, Disp: , Rfl:  .  PAZEO 0.7 % SOLN, Place 1 drop into both eyes daily. , Disp: , Rfl:  .  pravastatin (PRAVACHOL) 20 MG tablet, TAKE 1 TABLET (20 MG TOTAL) BY MOUTH DAILY., Disp: 90 tablet, Rfl: 1 .  Avanafil (STENDRA) 200 MG TABS, Take 1 tablet by mouth daily as needed., Disp: 2 tablet, Rfl: 0 .  hydrocortisone (ANUSOL-HC) 2.5 % rectal cream, Place 1 application rectally 2 (two) times daily., Disp: 30 g, Rfl: 0  No Known Allergies   Review of Systems Constitutional: -fever, -chills, -sweats, -unexpected weight change, -decreased appetite, +fatigue Allergy: -sneezing, -itching, -congestion Dermatology: -changing moles, --rash, -lumps ENT: -runny nose, -ear pain, -sore throat, -hoarseness, -sinus pain, -teeth pain, - ringing in ears, -hearing loss, -nosebleeds Cardiology: -chest pain, -palpitations, -swelling, -difficulty breathing when  lying flat, -waking up short of breath Respiratory: -cough, -shortness of breath, -difficulty breathing with exercise or exertion, -wheezing, -coughing up blood Gastroenterology: -abdominal pain, -nausea, -vomiting, -diarrhea, -constipation, -blood in stool, -changes in bowel movement, -difficulty swallowing or eating Hematology: -bleeding, -bruising  Musculoskeletal: -joint aches, -muscle aches, -joint swelling, -back pain, -neck pain, -cramping, -changes in gait Ophthalmology: denies vision changes, eye redness, itching, discharge Urology: -burning with urination, -difficulty urinating, -blood in urine, -urinary frequency, -urgency, -incontinence Neurology: -headache, -weakness, -tingling, -numbness, -memory loss, -falls, -dizziness Psychology: -depressed mood, -agitation, -sleep problems     Objective:   BP 110/70   Pulse 66   Ht '5\' 11"'$  (1.803 m)   Wt 280 lb 12.8 oz (127.4 kg)   SpO2 96%   BMI 39.16 kg/m   Wt Readings from Last 3 Encounters:  05/11/16 280 lb 12.8 oz (127.4 kg)  04/18/16 286 lb 12.8 oz (130.1 kg)  03/11/16 278 lb 8 oz (126.3 kg)    General appearance: alert, no distress, WD/WN, African American male Skin: mid back right of spine with arrow head shaped scar from prior MRSA lesion, and left mid back just adjacent to other scar with linear surgical scar, another small linear surgical scar to right of the arrow head shape scar, left upper thigh with triangular white/yellow hypopigmented unchanged lesion, flat, no other worrisome lesions HEENT: normocephalic, conjunctiva/corneas normal, sclerae anicteric, PERRLA, EOMi, nares patent, no discharge or erythema, pharynx normal Oral cavity: MMM, tongue normal, teeth normal Neck: supple, no lymphadenopathy, no thyromegaly, no masses, normal ROM, no bruits Chest: non tender, normal shape and expansion Heart: RRR, normal S1, S2, no murmurs Lungs: CTA bilaterally, no wheezes, rhonchi, or rales Abdomen: +bs, soft, non tender,  non distended, no masses, no hepatomegaly, no splenomegaly, no bruits Back: non tender, normal ROM, no scoliosis Musculoskeletal: upper extremities non tender, no obvious deformity, normal ROM throughout, lower extremities non tender, no obvious deformity, normal ROM throughout Extremities: no edema, no cyanosis, no clubbing Pulses: 2+ symmetric, upper and lower extremities, normal cap refill Neurological: alert, oriented x 3, CN2-12 intact, strength normal upper extremities and lower extremities, sensation normal throughout, DTRs 2+ throughout, no cerebellar signs, gait normal Psychiatric: normal affect, behavior normal, pleasant  GU: normal male external genitalia,circumcised, nontender, no masses, no hernia, no lymphadenopathy Rectal: few external hemorrhondis, mildly tender, otherwise prostate WNL   Assessment and Plan :    Encounter Diagnoses  Name Primary?  . Routine general medical examination at a health care facility Yes  . OSA on CPAP   . Dyslipidemia   . Former smoker   . Other fatigue   . Hot flashes   . Morbid obesity ([place])   . Skin lesion   . External hemorrhoid   . Encounter for hepatitis C screening test for low risk patient   . Erectile dysfunction, unspecified erectile dysfunction type     Physical exam - discussed and counseled on healthy lifestyle, diet, exercise, preventative care, vaccinations, sick and well care, proper use of emergency dept and after hours care, and addressed their concerns.    Health screening: See your eye doctor yearly for routine vision care. See your dentist yearly for routine dental care including hygiene visits twice yearly.  Cancer screening Discussed colonoscopy screening age 4yo unless higher risk for earlier screening Discussed PSA, prostate exam, and prostate cancer screening risks/benefits.   Discussed prostate symptoms as well.  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  He has tried viagra before, wants to try something different. Hemorrhoids - discussed preventative measures, acute treatment recommendations, f/u prn OSA - c/t CPAP Congratulated his smoking cessation!  Kael was seen today for annual exam.  Diagnoses and all orders for this visit:  Routine general medical examination at a health care facility -     Urinalysis Dipstick -     CBC -     Hemoglobin A1c -     TSH -     Testosterone -     Hepatitis C antibody  OSA on CPAP  Dyslipidemia  Former smoker  Other fatigue -     TSH -     Testosterone  Hot flashes -     TSH -     Testosterone  Morbid obesity ([place])  Skin lesion  External hemorrhoid  Encounter for hepatitis C screening test for low risk patient -     Hepatitis C antibody  Erectile dysfunction, unspecified erectile dysfunction type  Other orders -     hydrocortisone (ANUSOL-HC) 2.5 % rectal cream; Place 1 application rectally 2 (two) times daily. -     Avanafil (STENDRA) 200 MG TABS; Take 1 tablet by mouth daily as needed.   Follow-up pending labs, yearly for physical

## 2016-05-12 ENCOUNTER — Other Ambulatory Visit: Payer: Self-pay | Admitting: Medical

## 2016-05-12 DIAGNOSIS — N529 Male erectile dysfunction, unspecified: Secondary | ICD-10-CM

## 2016-05-12 LAB — TESTOSTERONE: Testosterone: 367 ng/dL (ref 250–827)

## 2016-05-12 LAB — HEPATITIS C ANTIBODY: HCV AB: NEGATIVE

## 2016-05-12 LAB — HEMOGLOBIN A1C
HEMOGLOBIN A1C: 4.8 % (ref <5.7)
Mean Plasma Glucose: 91 mg/dL

## 2016-05-12 MED ORDER — FLUTICASONE PROPIONATE 50 MCG/ACT NA SUSP: 2.0000 | Freq: Every day | NASAL | 11 refills | Status: DC

## 2016-05-12 MED ORDER — AVANAFIL 200 MG PO TABS: 1.0000 | ORAL_TABLET | Freq: Every day | ORAL | 3 refills | Status: DC | PRN

## 2016-05-17 ENCOUNTER — Telehealth: Payer: Self-pay

## 2016-05-17 NOTE — Telephone Encounter (Signed)
Pls check with home health.   Should be auto titrate setting.   It can be bumped up as far as I am concerned, but home health may have a range or protocol for next higher auto settings.  Let me know

## 2016-05-17 NOTE — Telephone Encounter (Signed)
Pt called stating that he needs to increase the pressure on his CPAP machine and that [facility] told him he needed an order faxed to them. He reports he is starting to snore with the CPAP machine. Victorino December

## 2016-05-18 ENCOUNTER — Other Ambulatory Visit: Payer: Self-pay | Admitting: Medical

## 2016-05-18 NOTE — Telephone Encounter (Signed)
ok 

## 2016-05-18 NOTE — Telephone Encounter (Signed)
Juliann Pulse with [facility] called, he had twochooses , she said that we can send an order for a new machine because he got his machine back in 2012 and his machine doesn't have auto titration.  Or send an order to auto titration for 2 weeks. They will send out a machine to use for 2 weeks then download the reading to send to [place] review . To determine what the ranging should be, he is now at a 16 . Spoke with pt he wants to go with the trail cpap machine

## 2016-05-18 NOTE — Telephone Encounter (Signed)
Which one there is two choices

## 2016-05-18 NOTE — Telephone Encounter (Signed)
Lets go with the one the patient wants

## 2016-05-18 NOTE — Telephone Encounter (Signed)
Called [place] with [facility] to look into this for me.

## 2016-05-19 NOTE — Telephone Encounter (Signed)
Sent in order to [facility]

## 2016-05-25 DIAGNOSIS — G4733 Obstructive sleep apnea (adult) (pediatric): Secondary | ICD-10-CM | POA: Diagnosis not present

## 2016-06-09 ENCOUNTER — Encounter: Payer: Self-pay | Admitting: Medical

## 2016-06-14 ENCOUNTER — Telehealth: Payer: Self-pay | Admitting: Medical

## 2016-06-14 NOTE — Telephone Encounter (Signed)
Called and l/m for pt to call us back , I will sent  Message to [facility] with [facility] about this.

## 2016-06-14 NOTE — Telephone Encounter (Signed)
Pt requesting to speak to [facility] about his CPAP order. Want an update. Also want to discuss tuning up the levels on the CPAP because pt thinks he need it increased. Pt will be in a meeting from 2 - 4 today but ok to leave a message.

## 2016-06-16 NOTE — Telephone Encounter (Signed)
Spoke with Sonia Baller from [facility] . She said that they did receive the order for his c-pap.refaxed order to them to [facility].

## 2016-06-17 DIAGNOSIS — N400 Enlarged prostate without lower urinary tract symptoms: Secondary | ICD-10-CM | POA: Diagnosis not present

## 2016-06-21 DIAGNOSIS — N400 Enlarged prostate without lower urinary tract symptoms: Secondary | ICD-10-CM | POA: Diagnosis not present

## 2016-07-05 ENCOUNTER — Telehealth: Payer: Self-pay | Admitting: Family Medicine

## 2016-07-05 NOTE — Telephone Encounter (Signed)
Do I need to do anything else or is it up to home health at this time?

## 2016-07-05 NOTE — Telephone Encounter (Signed)
Wife (Jewel) called from [phone] and states [facility] is stating we have not sent them all the info that they are needing as patient is stopping breathing at night with cpap.  I called [facility] and spoke with Iona Beard, he states as Dimas Chyle [phone]) had already left. Iona Beard needs ov with details regarding the usage and benefits from the therapy of the sleep study, along with orig sleep study and titration.  FAX [phone].  I sent sleep study and titration report to [place].  Called Jewel back ( she is on HIPAA)  Reached voice mail, lmtrc.  Pt needs ov to discuss CPAP and benefits and usage of this therapy.  Once complete the note needs to go to [place].

## 2016-07-06 NOTE — Telephone Encounter (Signed)
Pt needs an office visit , pt has an appt set up

## 2016-07-07 ENCOUNTER — Other Ambulatory Visit: Payer: Self-pay

## 2016-07-07 DIAGNOSIS — R5383 Other fatigue: Secondary | ICD-10-CM

## 2016-07-07 DIAGNOSIS — G4733 Obstructive sleep apnea (adult) (pediatric): Secondary | ICD-10-CM

## 2016-07-07 DIAGNOSIS — Z9989 Dependence on other enabling machines and devices: Secondary | ICD-10-CM

## 2016-07-07 NOTE — Telephone Encounter (Signed)
Sonia Baller with [facility] called back today asking for the baseline diagnotic sleep study that was done back on 03/02/10. We do not have this in pt's chart, I have contacted sleep center and they can not find this so pt will have to have another sleep study done in order for insurance to patient for cpap supplies. Pt was given cpap machine back in 2012 as it was a charity thing (per [place]) and didn't have to pay for it so now that they have to file it under insurance, its required that a new sleep study be done. Pt is coming in tomorrow 6/14 to talk about this cpap info.

## 2016-07-08 ENCOUNTER — Telehealth: Payer: Self-pay | Admitting: Medical

## 2016-07-08 ENCOUNTER — Ambulatory Visit (INDEPENDENT_AMBULATORY_CARE_PROVIDER_SITE_OTHER): Payer: 59 | Admitting: Medical

## 2016-07-08 VITALS — BP 114/70 | HR 70 | Wt 282.6 lb

## 2016-07-08 DIAGNOSIS — R5383 Other fatigue: Secondary | ICD-10-CM | POA: Diagnosis not present

## 2016-07-08 DIAGNOSIS — G4733 Obstructive sleep apnea (adult) (pediatric): Secondary | ICD-10-CM | POA: Diagnosis not present

## 2016-07-08 DIAGNOSIS — R400 Somnolence: Secondary | ICD-10-CM

## 2016-07-08 NOTE — Telephone Encounter (Signed)
Before we proceed with repeat sleep study, see if we can use his 2012 titration report which references his 2012 sleep study results, and send to [facility] [place] to get new CPAP and supplies.   He doesn't want to deal with [facility] as they are wanting the actual 2012 study although they already have the 2012 titration report.  Not sure how new the study has to be still be valid. See if [facility] or [facility] can use the 2012 report for new CPAP as his old CPAP is several years old.  His old CPAP didn't have auto titrate ability.  Louretta Shorten - if the study is too old for [facility] or [facility], then refer to [facility] but specify home sleep study!

## 2016-07-08 NOTE — Progress Notes (Addendum)
  Subjective:  Roger Mendoza. is a 56 y.o. male who presents for f/u on sleep apnea.    His CPAP is several years old, wanted to get updated equipment as he thinks the pressure isn't as effective as in the past.  He is compliant with CPAP and does benefit with CPAP in regards to fatigue, snoring, sleep  .if not using CPAP he has daytime somnolence and doesn't wake refreshed  He relies on the CPAP for these symptoms.  He has also gained 40 lb since the last sleep study in 2012.   He does note fatigue.  He isn't exercising a lot, diet could be better.  No other aggravating or relieving factors. No other complaint.  Past Medical History:  Diagnosis Date  . Allergic rhinitis, seasonal   . Colon polyp 02/2012   tubular adenoma, Dr. Zenovia Jarred  . De Quervain's tenosynovitis, right    hx/o  . Epididymitis    hx/o as adult  . Erectile dysfunction   . Family history of premature CAD    father  . Hyperlipidemia   . Left inguinal hernia    tiny as of 03/2014  . MRSA (methicillin resistant staph aureus) culture positive    history of MRSA skin on pt's back  . Normal cardiac stress test    remote past per patient, [place], [place]  . Obesity   . OSA on CPAP   . Recurrent UTI    Dr. Janice Norrie, [facility]  . Tinea pedis   . Tobacco use    4 cigarrettes daily  . Wears glasses    Current Outpatient Prescriptions on File Prior to Visit  Medication Sig Dispense Refill  . Avanafil (STENDRA) 200 MG TABS Take 1 tablet by mouth daily as needed. 10 tablet 3  . [facility] ASPIRIN EC 81 MG EC tablet TAKE 1 TABLET (81 MG TOTAL) BY MOUTH DAILY. 90 tablet 3  . fluticasone (FLONASE) 50 MCG/ACT nasal spray Place 2 sprays into both nostrils daily. 16 g 11  . NON FORMULARY C PAP    . pravastatin (PRAVACHOL) 20 MG tablet TAKE 1 TABLET (20 MG TOTAL) BY MOUTH DAILY. 90 tablet 1  . hydrocortisone (ANUSOL-HC) 2.5 % rectal cream Place 1 application rectally 2 (two) times daily. (Patient not taking: Reported on  07/08/2016) 30 g 0   No current facility-administered medications on file prior to visit.     The following portions of the patient's history were reviewed and updated as appropriate: allergies, current medications, past family history, past medical history, past social history, past surgical history and problem list.  ROS Otherwise as in subjective above  Objective: BP 114/70   Pulse 70   Wt 282 lb 9.6 oz (128.2 kg)   SpO2 94%   BMI 39.41 kg/m    General appearance: alert, no distress, WD/WN    Assessment: Encounter Diagnoses  Name Primary?  . OSA (obstructive sleep apnea) Yes  . Morbid obesity ([place])   . Other fatigue   . Daytime somnolence      Plan: Discussed his hx/o OSA, obesity.  counseled on weight loss recommendations, diet, exercise.  Will try and get CPAP through another carrier.  However, we may have to get repeat sleep study.  offered weight loss medication.  He declines this.   Follow up: pending call back.

## 2016-07-12 NOTE — Telephone Encounter (Signed)
Sent to [facility]

## 2016-07-20 DIAGNOSIS — R400 Somnolence: Secondary | ICD-10-CM | POA: Insufficient documentation

## 2016-07-20 NOTE — Telephone Encounter (Signed)
Jonni Sanger with [facility] called asking if you can add excessive daytime sleepiness in your last note 07/08/2016.   This encounter was created in error - please disregard.

## 2016-07-22 ENCOUNTER — Telehealth: Payer: Self-pay | Admitting: Medical

## 2016-07-22 NOTE — Telephone Encounter (Signed)
Pt stopped by office and wanted to know about his CPAP, states can't get replacement parts for his old machine so needs new machine and insurance has already approved for it.  Please let him know what he needs to do or have [facility] call him to get this taken care of. Thanks

## 2016-07-25 NOTE — Telephone Encounter (Signed)
Called and spoke with pt to let him know that [facility]  Are working on getting his cpap , I let himknow that I spoke with Jonni Sanger last week that his insurance company had require more information that [facility]  Is on it .

## 2016-08-05 ENCOUNTER — Telehealth: Payer: Self-pay | Admitting: Medical

## 2016-08-05 NOTE — Telephone Encounter (Signed)
Tarsha please call [facility] about his sleep study.  He simply needs updated CPAP machine and supplies, but needs new sleep study to get this covered.  However, I received a letter today for [facility] and its confusing.  It seems to say he can't have a sleep lab study and that home test would be preferred, but then in another place it looks as if they wont cover the home one, so I am confused.   Please have Elvina Sidle figure out what we can do here.  The patient may ultimately have to call and complain to [facility] about getting either updated CPAP or updated sleep study.

## 2016-08-05 NOTE — Telephone Encounter (Signed)
I need to follow up with andy from [facility] in regards to this.

## 2016-08-09 NOTE — Telephone Encounter (Signed)
Called and l/m for andy with [facility] about this.

## 2016-08-10 DIAGNOSIS — G4710 Hypersomnia, unspecified: Secondary | ICD-10-CM | POA: Diagnosis not present

## 2016-08-10 DIAGNOSIS — G4733 Obstructive sleep apnea (adult) (pediatric): Secondary | ICD-10-CM | POA: Diagnosis not present

## 2016-08-11 NOTE — Telephone Encounter (Signed)
Per Jonni Sanger with [facility] pt has been set up

## 2016-09-02 ENCOUNTER — Other Ambulatory Visit: Payer: Self-pay | Admitting: Medical

## 2016-09-09 ENCOUNTER — Encounter: Payer: Self-pay | Admitting: Medical

## 2016-09-09 ENCOUNTER — Ambulatory Visit (INDEPENDENT_AMBULATORY_CARE_PROVIDER_SITE_OTHER): Payer: 59 | Admitting: Medical

## 2016-09-09 VITALS — BP 116/70 | HR 74 | Wt 286.4 lb

## 2016-09-09 DIAGNOSIS — L729 Follicular cyst of the skin and subcutaneous tissue, unspecified: Secondary | ICD-10-CM

## 2016-09-09 DIAGNOSIS — R229 Localized swelling, mass and lump, unspecified: Secondary | ICD-10-CM | POA: Diagnosis not present

## 2016-09-09 DIAGNOSIS — N529 Male erectile dysfunction, unspecified: Secondary | ICD-10-CM

## 2016-09-09 DIAGNOSIS — IMO0002 Reserved for concepts with insufficient information to code with codable children: Secondary | ICD-10-CM

## 2016-09-09 MED ORDER — SILDENAFIL CITRATE 20 MG PO TABS: ORAL_TABLET | ORAL | 2 refills | Status: DC

## 2016-09-09 MED ORDER — NAPROXEN 500 MG PO TABS: 500.0000 mg | ORAL_TABLET | Freq: Two times a day (BID) | ORAL | 0 refills | Status: DC

## 2016-09-09 NOTE — Progress Notes (Signed)
Subjective: Chief Complaint  Patient presents with  . knot on left shoulder    knot on left shoulder  little bit of pain    Here for knot on left shoulder region for the past 2 weeks.   Just came out of the blue.  It was a little more swollen, but he took left over the counter naprosyn and this helped.  Still there though.  No arm pain otherwise.  No redness, no fever.    He notes that his insurance wont' pay for any Viagra or similar.   Wants advice.  Viagra does work well.   Past Medical History:  Diagnosis Date  . Allergic rhinitis, seasonal   . Colon polyp 02/2012   tubular adenoma, Dr. Zenovia Jarred  . De Quervain's tenosynovitis, right    hx/o  . Epididymitis    hx/o as adult  . Erectile dysfunction   . Family history of premature CAD    father  . Hyperlipidemia   . Left inguinal hernia    tiny as of 03/2014  . MRSA (methicillin resistant staph aureus) culture positive    history of MRSA skin on pt's back  . Normal cardiac stress test    remote past per patient, [place], [place]  . Obesity   . OSA on CPAP   . Recurrent UTI    Dr. Janice Norrie, [facility]  . Tinea pedis   . Tobacco use    4 cigarrettes daily  . Wears glasses    Current Outpatient Prescriptions on File Prior to Visit  Medication Sig Dispense Refill  . [facility] ASPIRIN EC 81 MG EC tablet TAKE 1 TABLET (81 MG TOTAL) BY MOUTH DAILY. 90 tablet 3  . fluticasone (FLONASE) 50 MCG/ACT nasal spray Place 2 sprays into both nostrils daily. 16 g 11  . hydrocortisone (ANUSOL-HC) 2.5 % rectal cream Place 1 application rectally 2 (two) times daily. 30 g 0  . NON FORMULARY C PAP    . pravastatin (PRAVACHOL) 20 MG tablet TAKE 1 TABLET (20 MG TOTAL) BY MOUTH DAILY. 90 tablet 1  . Avanafil (STENDRA) 200 MG TABS Take 1 tablet by mouth daily as needed. 10 tablet 3   No current facility-administered medications on file prior to visit.    ROS as in subjective    Objective: BP 116/70   Pulse 74   Wt 286 lb 6.4 oz (129.9 kg)    SpO2 95%   BMI 39.94 kg/m   Gen: wd, wn, nad Left shoulder region between distal clavicle and coracoid process with 2cm slightly mobile cystic mass, mildly tender.  No erythema, no warmth, no induration or fluctuance.   There is bony prominence behind the cyst, but its not clear if there is a bony mass.  ROM does cause the cystic mass to move medially a bit.   otherwise arm and shoulder without deformity, normal ROM, no swelling, arm neurovascularly intact   Assessment: Encounter Diagnoses  Name Primary?  . Cyst of skin Yes  . Erectile dysfunction, unspecified erectile dysfunction type   . Lump     Plan: Cyst of skin - begin ice pack 36min BID, naprosyn, and if not improving or not resolving within 2 weeks then recheck. However if red, swollen, warm, then recheck right away.  I don't specifically palpate a bony deformity but advised recheck in 2 weeks to re-examine  ED - begin trial of sildenafil generic.  Discussed risks/benefits and proper use.     Roger Mendoza was seen today for  knot on left shoulder.  Diagnoses and all orders for this visit:  Cyst of skin  Erectile dysfunction, unspecified erectile dysfunction type  Lump  Other orders -     naproxen (NAPROSYN) 500 MG tablet; Take 1 tablet (500 mg total) by mouth 2 (two) times daily with a meal. -     Discontinue: sildenafil (REVATIO) 20 MG tablet; 1-5 tablets (20 mg to 100 mg) prior to sexual activity daily prn -     sildenafil (REVATIO) 20 MG tablet; 1-5 tablets (20 mg to 100 mg) prior to sexual activity daily prn

## 2016-09-10 DIAGNOSIS — G4733 Obstructive sleep apnea (adult) (pediatric): Secondary | ICD-10-CM | POA: Diagnosis not present

## 2016-09-10 DIAGNOSIS — G4710 Hypersomnia, unspecified: Secondary | ICD-10-CM | POA: Diagnosis not present

## 2016-09-12 DIAGNOSIS — G4733 Obstructive sleep apnea (adult) (pediatric): Secondary | ICD-10-CM | POA: Diagnosis not present

## 2016-09-12 DIAGNOSIS — G4710 Hypersomnia, unspecified: Secondary | ICD-10-CM | POA: Diagnosis not present

## 2016-10-11 DIAGNOSIS — G4710 Hypersomnia, unspecified: Secondary | ICD-10-CM | POA: Diagnosis not present

## 2016-10-11 DIAGNOSIS — G4733 Obstructive sleep apnea (adult) (pediatric): Secondary | ICD-10-CM | POA: Diagnosis not present

## 2016-10-12 DIAGNOSIS — G4733 Obstructive sleep apnea (adult) (pediatric): Secondary | ICD-10-CM | POA: Diagnosis not present

## 2016-10-12 DIAGNOSIS — G4710 Hypersomnia, unspecified: Secondary | ICD-10-CM | POA: Diagnosis not present

## 2016-10-18 DIAGNOSIS — R109 Unspecified abdominal pain: Secondary | ICD-10-CM | POA: Diagnosis not present

## 2016-11-10 DIAGNOSIS — G4710 Hypersomnia, unspecified: Secondary | ICD-10-CM | POA: Diagnosis not present

## 2016-11-10 DIAGNOSIS — G4733 Obstructive sleep apnea (adult) (pediatric): Secondary | ICD-10-CM | POA: Diagnosis not present

## 2016-11-11 DIAGNOSIS — G4710 Hypersomnia, unspecified: Secondary | ICD-10-CM | POA: Diagnosis not present

## 2016-11-11 DIAGNOSIS — G4733 Obstructive sleep apnea (adult) (pediatric): Secondary | ICD-10-CM | POA: Diagnosis not present

## 2016-11-22 ENCOUNTER — Other Ambulatory Visit: Payer: Self-pay | Admitting: Medical

## 2016-11-22 DIAGNOSIS — K644 Residual hemorrhoidal skin tags: Secondary | ICD-10-CM

## 2016-12-11 DIAGNOSIS — G4733 Obstructive sleep apnea (adult) (pediatric): Secondary | ICD-10-CM | POA: Diagnosis not present

## 2016-12-11 DIAGNOSIS — G4710 Hypersomnia, unspecified: Secondary | ICD-10-CM | POA: Diagnosis not present

## 2016-12-12 DIAGNOSIS — G4733 Obstructive sleep apnea (adult) (pediatric): Secondary | ICD-10-CM | POA: Diagnosis not present

## 2016-12-12 DIAGNOSIS — G4710 Hypersomnia, unspecified: Secondary | ICD-10-CM | POA: Diagnosis not present

## 2017-01-05 ENCOUNTER — Encounter: Payer: Self-pay | Admitting: Family Medicine

## 2017-01-05 ENCOUNTER — Ambulatory Visit: Payer: 59 | Admitting: Family Medicine

## 2017-01-05 VITALS — BP 126/88 | HR 88 | Wt 297.8 lb

## 2017-01-05 DIAGNOSIS — M549 Dorsalgia, unspecified: Secondary | ICD-10-CM

## 2017-01-05 DIAGNOSIS — M545 Low back pain: Secondary | ICD-10-CM

## 2017-01-05 DIAGNOSIS — R369 Urethral discharge, unspecified: Secondary | ICD-10-CM

## 2017-01-05 LAB — POCT URINALYSIS DIP (PROADVANTAGE DEVICE)
Bilirubin, UA: NEGATIVE
Blood, UA: NEGATIVE
GLUCOSE UA: NEGATIVE mg/dL
Ketones, POC UA: NEGATIVE mg/dL
LEUKOCYTES UA: NEGATIVE
NITRITE UA: NEGATIVE
PROTEIN UA: NEGATIVE mg/dL
SPECIFIC GRAVITY, URINE: 1.015
UUROB: NEGATIVE
pH, UA: 6 (ref 5.0–8.0)

## 2017-01-05 NOTE — Patient Instructions (Signed)
Heat for 20 minutes 3 times a day.  Do some gentle stretching after that.  800 mg of ibuprofen 3 times per day.

## 2017-01-05 NOTE — Progress Notes (Signed)
   Subjective:    Patient ID: Roger Learn., male    DOB: 03-15-1960, 56 y.o.   MRN: [id number]  HPI He complains of a 3-week history of intermittent right-sided back pain.  He notes that increased physical activity especially over the weekend with shoveling snow made this worse.  He is also had a slight penile discharge but no dysuria, urgency, that since he.  He is sexually active only with his wife.   Review of Systems     Objective:   Physical Exam Alert and in no distress.  No palpable tenderness in his back.  SI joints are normal.  Normal motion of the back.  Some pain with lateral motion.  Negative straight leg raising.  Faber test negative.         Assessment & Plan:  Low back pain, unspecified back pain laterality, unspecified chronicity, with sciatica presence unspecified - Plan: POCT Urinalysis DIP (Proadvantage Device)  Musculoskeletal back pain  Penile discharge - Plan: C. trachomatis/N. gonorrhoeae RNA  I explained that his back pain is really more musculoskeletal.  Recommend heat, stretching, anti-inflammatory.  He will return if further trouble.

## 2017-01-07 LAB — C. TRACHOMATIS/N. GONORRHOEAE RNA
C. TRACHOMATIS RNA, TMA: NOT DETECTED
N. GONORRHOEAE RNA, TMA: NOT DETECTED

## 2017-01-09 ENCOUNTER — Ambulatory Visit: Payer: 59 | Admitting: Medical

## 2017-01-10 DIAGNOSIS — G4710 Hypersomnia, unspecified: Secondary | ICD-10-CM | POA: Diagnosis not present

## 2017-01-10 DIAGNOSIS — G4733 Obstructive sleep apnea (adult) (pediatric): Secondary | ICD-10-CM | POA: Diagnosis not present

## 2017-01-11 DIAGNOSIS — G4710 Hypersomnia, unspecified: Secondary | ICD-10-CM | POA: Diagnosis not present

## 2017-01-11 DIAGNOSIS — G4733 Obstructive sleep apnea (adult) (pediatric): Secondary | ICD-10-CM | POA: Diagnosis not present

## 2017-01-19 ENCOUNTER — Telehealth: Payer: Self-pay | Admitting: Medical

## 2017-01-19 NOTE — Telephone Encounter (Signed)
Copied from [place] [phone]. Topic: General - Other >> Jan 19, 2017  2:30 PM Yvette Rack wrote: Reason for CRM: patient wants to know why is he dismissed from the practice he wants to become a New Patient here with [facility] please call pt to explain in the notes it states   The patient has been dismissed from [facility] since 02/24/10 for the following reason: Discharged from Practice

## 2017-01-20 NOTE — Telephone Encounter (Signed)
Please disregard message. Pt has not been dismissed from the practice. Spoke with Harrie Jeans and Ria Comment with Epic training and cannot find this message in pts record. Pt has also never been seen at a [facility] office. New pt appt has been scheduled

## 2017-01-30 ENCOUNTER — Ambulatory Visit: Payer: 59 | Admitting: Family Medicine

## 2017-02-03 ENCOUNTER — Encounter: Payer: Self-pay | Admitting: Family Medicine

## 2017-02-03 ENCOUNTER — Ambulatory Visit (INDEPENDENT_AMBULATORY_CARE_PROVIDER_SITE_OTHER): Payer: 59 | Admitting: Family Medicine

## 2017-02-03 VITALS — BP 132/78 | HR 87 | Temp 98.1°F | Ht 72.0 in | Wt 296.6 lb

## 2017-02-03 DIAGNOSIS — Z7689 Persons encountering health services in other specified circumstances: Secondary | ICD-10-CM

## 2017-02-03 DIAGNOSIS — Z9989 Dependence on other enabling machines and devices: Secondary | ICD-10-CM | POA: Diagnosis not present

## 2017-02-03 DIAGNOSIS — G4733 Obstructive sleep apnea (adult) (pediatric): Secondary | ICD-10-CM | POA: Diagnosis not present

## 2017-02-03 DIAGNOSIS — J302 Other seasonal allergic rhinitis: Secondary | ICD-10-CM

## 2017-02-03 MED ORDER — CETIRIZINE HCL 10 MG PO TABS: 10.0000 mg | ORAL_TABLET | Freq: Every day | ORAL | 11 refills | Status: DC

## 2017-02-03 NOTE — Progress Notes (Signed)
Patient presents to clinic today to establish care.  SUBJECTIVE: PMH: Patient is a 57 year old male past medical history significant for sleep apnea on CPAP, and allergies.  Patient was previously seen by [facility].  Seasonal allergies/sinus issues: -Patient states he has continued issues with allergies year round. -He was previously seen by an ENT for adenoid removal -Patient states the right side of his nose/face feels congested -Patient takes Flonase.  OSA on CPAP -Patient states he wears a CPAP nightly  -he has had it for last 5 years, recently had the machine replaced.  Allergies: NKDA  Past surgical history: -Adenoid removal  Social history: Patient is married.  He is currently employed by [employer].  Patient endorses social alcohol use.  He denies tobacco or drug use.  Family medical history: Mom-HTN Dad-MI   Past Medical History:  Diagnosis Date  . Allergic rhinitis, seasonal   . Colon polyp 02/2012   tubular adenoma, Dr. Zenovia Jarred  . De Quervain's tenosynovitis, right    hx/o  . Epididymitis    hx/o as adult  . Erectile dysfunction   . Family history of premature CAD    father  . Hyperlipidemia   . Left inguinal hernia    tiny as of 03/2014  . MRSA (methicillin resistant staph aureus) culture positive    history of MRSA skin on pt's back  . Normal cardiac stress test    remote past per patient, [place], [place]  . Obesity   . OSA on CPAP   . Recurrent UTI    Dr. Janice Norrie, [facility]  . Tinea pedis   . Tobacco use    4 cigarrettes daily  . Wears glasses     Past Surgical History:  Procedure Laterality Date  . ADENOIDECTOMY    . COLONOSCOPY  02/2012   Dr. Zenovia Jarred, repeat 02/2017  . ESOPHAGEAL DILATION    . ROTATOR CUFF REPAIR  1997   left side  . SKIN SURGERY     large area of MRSA on back  . TONSILLECTOMY    . UPPER GASTROINTESTINAL ENDOSCOPY      Current Outpatient Medications on File Prior to Visit  Medication  Sig Dispense Refill  . [facility] ASPIRIN EC 81 MG EC tablet TAKE 1 TABLET (81 MG TOTAL) BY MOUTH DAILY. 90 tablet 3  . fluticasone (FLONASE) 50 MCG/ACT nasal spray Place 2 sprays into both nostrils daily. 16 g 11  . NON FORMULARY C PAP    . PROCTOSOL HC 2.5 % rectal cream PLACE 1 APPLICATION RECTALLY 2 (TWO) TIMES DAILY. 28.35 g 0  . Avanafil (STENDRA) 200 MG TABS Take 1 tablet by mouth daily as needed. (Patient not taking: Reported on 02/03/2017) 10 tablet 3  . pravastatin (PRAVACHOL) 20 MG tablet TAKE 1 TABLET (20 MG TOTAL) BY MOUTH DAILY. (Patient not taking: Reported on 02/03/2017) 90 tablet 1  . sildenafil (REVATIO) 20 MG tablet 1-5 tablets (20 mg to 100 mg) prior to sexual activity daily prn (Patient not taking: Reported on 02/03/2017) 50 tablet 2   No current facility-administered medications on file prior to visit.     No Known Allergies  Family History  Problem Relation Age of Onset  . Hypertension Mother   . Arthritis Mother   . Hip dysplasia Mother   . Heart disease Mother   . Heart disease Father 60       died of MI  . Hyperlipidemia Father   . HIV Brother   .  Colon cancer Neg Hx   . Esophageal cancer Neg Hx   . Rectal cancer Neg Hx   . Stomach cancer Neg Hx   . Cancer Neg Hx   . Diabetes Neg Hx   . Stroke Neg Hx     Social History   Socioeconomic History  . Marital status: Married    Spouse name: Not on file  . Number of children: Not on file  . Years of education: Not on file  . Highest education level: Not on file  Social Needs  . Financial resource strain: Not on file  . Food insecurity - worry: Not on file  . Food insecurity - inability: Not on file  . Transportation needs - medical: Not on file  . Transportation needs - non-medical: Not on file  Occupational History  . Occupation: loads truck  Tobacco Use  . Smoking status: Former Smoker    Packs/day: 0.25    Years: 15.00    Pack years: 3.75    Types: Cigarettes    Last attempt to quit: 03/31/2014     Years since quitting: 2.8  . Smokeless tobacco: Never Used  Substance and Sexual Activity  . Alcohol use: Yes    Alcohol/week: 3.0 oz    Types: 5 Glasses of wine per week    Comment: couple glasses of wine per day  . Drug use: No  . Sexual activity: Not on file  Other Topics Concern  . Not on file  Social History Narrative   Married, has 50yo son, works for [employer] at [employer], loading trucks, exercise - walks a lot at work, diet - has cut back on sweets, bread and greasy foods.  Plays golf, used to play softball.  Attends church, deacon.  04/2016    ROS General: Denies fever, chills, night sweats, changes in weight, changes in appetite HEENT: Denies headaches, ear pain, changes in vision, rhinorrhea, sore throat CV: Denies CP, palpitations, SOB, orthopnea Pulm: Denies SOB, cough, wheezing GI: Denies abdominal pain, nausea, vomiting, diarrhea, constipation GU: Denies dysuria, hematuria, frequency, vaginal discharge Msk: Denies muscle cramps, joint pains Neuro: Denies weakness, numbness, tingling Skin: Denies rashes, bruising Psych: Denies depression, anxiety, hallucinations  BP 132/78 (BP Location: Right Arm, Patient Position: Sitting, Cuff Size: Large)   Pulse 87   Temp 98.1 F (36.7 C) (Oral)   Ht 6' (1.829 m)   Wt 296 lb 9.6 oz (134.5 kg)   BMI 40.23 kg/m   Physical Exam Gen. Pleasant, well developed, well-nourished, in NAD HEENT - [place]/AT, PERRL, no scleral icterus, no nasal drainage, pharynx with postnasal drainage, no erythema or exudate. Neck: No JVD, no thyromegaly, no carotid bruits Lungs: no use of accessory muscles, CTAB, no wheezes, rales or rhonchi Cardiovascular: RRR, 2/6 murmur best heard LUSB, no peripheral edema Abdomen: BS present, soft, nontender,nondistended Neuro:  A&Ox3, CN II-XII intact, normal gait Psych: normal affect, mood appropriate  Recent Results (from the past 2160 hour(s))  POCT Urinalysis DIP (Proadvantage Device)     Status: Normal    Collection Time: 01/05/17  4:13 PM  Result Value Ref Range   Color, UA yellow yellow   Clarity, UA clear clear   Glucose, UA negative negative mg/dL   Bilirubin, UA negative negative   Ketones, POC UA negative negative mg/dL   Specific Gravity, Urine 1.015    Blood, UA negative negative   pH, UA 6.0 5.0 - 8.0   Protein Ur, POC negative negative mg/dL   Urobilinogen, Ur neg  Nitrite, UA Negative Negative   Leukocytes, UA Negative Negative  C. trachomatis/N. gonorrhoeae RNA     Status: None   Collection Time: 01/05/17  4:18 PM  Result Value Ref Range   C. trachomatis RNA, TMA NOT DETECTED NOT DETECT   N. gonorrhoeae RNA, TMA NOT DETECTED NOT DETECT    Comment: This test was performed using the [employer] ([place].). . The analytical performance characteristics of this  assay, when used to test SurePath specimens have been determined by [employer]. .     Assessment/Plan: Seasonal allergies  - Plan: cetirizine (ZYRTEC) 10 MG tablet -Patient advised on proper nasal spray use.  Advised to continue using Flonase. -Referral for ENT suggested but patient declines at this time given previous experience.  OSA on CPAP -Continue nightly use of CPAP  Encounter to establish care -We reviewed the PMH, PSH, FH, SH, Meds and Allergies. -We provided refills for any medications we will prescribe as needed. -We addressed current concerns per orders and patient instructions. -We have asked for records for pertinent exams, studies, vaccines and notes from previous providers. -We have advised patient to follow up per instructions below.   F/u on or after 05/12/17 for CPE.    Grier Mitts, MD

## 2017-02-03 NOTE — Patient Instructions (Addendum)
CPAP and BiPAP Information CPAP and BiPAP are methods of helping a person breathe with the use of air pressure. CPAP stands for "continuous positive airway pressure." BiPAP stands for "bi-level positive airway pressure." In both methods, air is blown through your nose or mouth and into your air passages to help you breathe well. CPAP and BiPAP use different amounts of pressure to blow air. With CPAP, the amount of pressure stays the same while you breathe in and out. With BiPAP, the amount of pressure is increased when you breathe in (inhale) so that you can take larger breaths. Your health care provider will recommend whether CPAP or BiPAP would be more helpful for you. Why are CPAP and BiPAP treatments used? CPAP or BiPAP can be helpful if you have:  Sleep apnea.  Chronic obstructive pulmonary disease (COPD).  Heart failure.  Medical conditions that weaken the muscles of the chest including muscular dystrophy, or neurological diseases such as amyotrophic lateral sclerosis (ALS).  Other problems that cause breathing to be weak, abnormal, or difficult.  CPAP is most commonly used for obstructive sleep apnea (OSA) to keep the airways from collapsing when the muscles relax during sleep. How is CPAP or BiPAP administered? Both CPAP and BiPAP are provided by a small machine with a flexible plastic tube that attaches to a plastic mask. You wear the mask. Air is blown through the mask into your nose or mouth. The amount of pressure that is used to blow the air can be adjusted on the machine. Your health care provider will determine the pressure setting that should be used based on your individual needs. When should CPAP or BiPAP be used? In most cases, the mask only needs to be worn during sleep. Generally, the mask needs to be worn throughout the night and during any daytime naps. People with certain medical conditions may also need to wear the mask at other times when they are awake. Follow  instructions from your health care provider about when to use the machine. What are some tips for using the mask?  Because the mask needs to be snug, some people feel trapped or closed-in (claustrophobic) when first using the mask. If you feel this way, you may need to get used to the mask. One way to do this is by holding the mask loosely over your nose or mouth and then gradually applying the mask more snugly. You can also gradually increase the amount of time that you use the mask.  Masks are available in various types and sizes. Some fit over your mouth and nose while others fit over just your nose. If your mask does not fit well, talk with your health care provider about getting a different one.  If you are using a mask that fits over your nose and you tend to breathe through your mouth, a chin strap may be applied to help keep your mouth closed.  The CPAP and BiPAP machines have alarms that may sound if the mask comes off or develops a leak.  If you have trouble with the mask, it is very important that you talk with your health care provider about finding a way to make the mask easier to tolerate. Do not stop using the mask. Stopping the use of the mask could have a negative impact on your health. What are some tips for using the machine?  Place your CPAP or BiPAP machine on a secure table or stand near an electrical outlet.  Know where the on/off  switch is located on the machine.  Follow instructions from your health care provider about how to set the pressure on your machine and when you should use it.  Do not eat or drink while the CPAP or BiPAP machine is on. Food or fluids could get pushed into your lungs by the pressure of the CPAP or BiPAP.  Do not smoke. Tobacco smoke residue can damage the machine.  For home use, CPAP and BiPAP machines can be rented or purchased through home health care companies. Many different brands of machines are available. Renting a machine before  purchasing may help you find out which particular machine works well for you.  Keep the CPAP or BiPAP machine and attachments clean. Ask your health care provider for specific instructions. Get help right away if:  You have redness or open areas around your nose or mouth where the mask fits.  You have trouble using the CPAP or BiPAP machine.  You cannot tolerate wearing the CPAP or BiPAP mask.  You have pain, discomfort, and bloating in your abdomen. Summary  CPAP and BiPAP are methods of helping a person breathe with the use of air pressure.  Both CPAP and BiPAP are provided by a small machine with a flexible plastic tube that attaches to a plastic mask.  If you have trouble with the mask, it is very important that you talk with your health care provider about finding a way to make the mask easier to tolerate. This information is not intended to replace advice given to you by your health care provider. Make sure you discuss any questions you have with your health care provider. Document Released: 10/09/2003 Document Revised: 11/30/2015 Document Reviewed: 11/30/2015 Elsevier Interactive Patient Education  2017 Elsevier Inc. Allergic Rhinitis, Adult Allergic rhinitis is an allergic reaction that affects the mucous membrane inside the nose. It causes sneezing, a runny or stuffy nose, and the feeling of mucus going down the back of the throat (postnasal drip). Allergic rhinitis can be mild to severe. There are two types of allergic rhinitis:  Seasonal. This type is also called hay fever. It happens only during certain seasons.  Perennial. This type can happen at any time of the year.  What are the causes? This condition happens when the body's defense system (immune system) responds to certain harmless substances called allergens as though they were germs.  Seasonal allergic rhinitis is triggered by pollen, which can come from grasses, trees, and weeds. Perennial allergic rhinitis may  be caused by:  House dust mites.  Pet dander.  Mold spores.  What are the signs or symptoms? Symptoms of this condition include:  Sneezing.  Runny or stuffy nose (nasal congestion).  Postnasal drip.  Itchy nose.  Tearing of the eyes.  Trouble sleeping.  Daytime sleepiness.  How is this diagnosed? This condition may be diagnosed based on:  Your medical history.  A physical exam.  Tests to check for related conditions, such as: ? Asthma. ? Pink eye. ? Ear infection. ? Upper respiratory infection.  Tests to find out which allergens trigger your symptoms. These may include skin or blood tests.  How is this treated? There is no cure for this condition, but treatment can help control symptoms. Treatment may include:  Taking medicines that block allergy symptoms, such as antihistamines. Medicine may be given as a shot, nasal spray, or pill.  Avoiding the allergen.  Desensitization. This treatment involves getting ongoing shots until your body becomes less sensitive to the allergen.  This treatment may be done if other treatments do not help.  If taking medicine and avoiding the allergen does not work, new, stronger medicines may be prescribed.  Follow these instructions at home:  Find out what you are allergic to. Common allergens include smoke, dust, and pollen.  Avoid the things you are allergic to. These are some things you can do to help avoid allergens: ? Replace carpet with wood, tile, or vinyl flooring. Carpet can trap dander and dust. ? Do not smoke. Do not allow smoking in your home. ? Change your heating and air conditioning filter at least once a month. ? During allergy season:  Keep windows closed as much as possible.  Plan outdoor activities when pollen counts are lowest. This is usually during the evening hours.  When coming indoors, change clothing and shower before sitting on furniture or bedding.  Take over-the-counter and prescription  medicines only as told by your health care provider.  Keep all follow-up visits as told by your health care provider. This is important. Contact a health care provider if:  You have a fever.  You develop a persistent cough.  You make whistling sounds when you breathe (you wheeze).  Your symptoms interfere with your normal daily activities. Get help right away if:  You have shortness of breath. Summary  This condition can be managed by taking medicines as directed and avoiding allergens.  Contact your health care provider if you develop a persistent cough or fever.  During allergy season, keep windows closed as much as possible. This information is not intended to replace advice given to you by your health care provider. Make sure you discuss any questions you have with your health care provider. Document Released: 10/05/2000 Document Revised: 02/18/2016 Document Reviewed: 02/18/2016 Elsevier Interactive Patient Education  [employer].

## 2017-02-08 ENCOUNTER — Ambulatory Visit: Payer: 59 | Admitting: Family Medicine

## 2017-02-10 DIAGNOSIS — G4710 Hypersomnia, unspecified: Secondary | ICD-10-CM | POA: Diagnosis not present

## 2017-02-10 DIAGNOSIS — G4733 Obstructive sleep apnea (adult) (pediatric): Secondary | ICD-10-CM | POA: Diagnosis not present

## 2017-02-13 ENCOUNTER — Telehealth: Payer: Self-pay | Admitting: Family Medicine

## 2017-02-13 NOTE — Telephone Encounter (Signed)
Copied from [place] [id number]. Topic: Quick Communication - Rx Refill/Question >> Feb 13, 2017 11:48 AM Marin Olp L wrote: Medication:  sildenafil (REVATIO) 20 MG tablet   Has the patient contacted their pharmacy? No.   (Agent: If no, request that the patient contact the pharmacy for the refill.)   Preferred Pharmacy (with phone number or street name): [id number] - [place]   Agent: Please be advised that RX refills may take up to 3 business days. We ask that you follow-up with your pharmacy.

## 2017-02-13 NOTE — Telephone Encounter (Signed)
Sildenafil refill. Medication not previously prescribed by Dr. Volanda Napoleon. Appt to establish care with Dr. Volanda Napoleon was on 02/03/17.

## 2017-02-14 ENCOUNTER — Other Ambulatory Visit: Payer: Self-pay | Admitting: Emergency Medicine

## 2017-02-14 MED ORDER — SILDENAFIL CITRATE 20 MG PO TABS: ORAL_TABLET | ORAL | 2 refills | Status: DC

## 2017-02-14 NOTE — Telephone Encounter (Signed)
Refill has been sent to patient pharmacy.

## 2017-02-14 NOTE — Telephone Encounter (Signed)
Pt calling today to check on this rx being sent to his pharmacy

## 2017-03-10 ENCOUNTER — Other Ambulatory Visit: Payer: Self-pay | Admitting: Medical

## 2017-03-10 NOTE — Telephone Encounter (Signed)
Called pt about refill on his medicine he said that he is no longer taking his pravastatin, also told him that he need an appt pt was at work and will call us back to make an appt.

## 2017-03-13 DIAGNOSIS — G4710 Hypersomnia, unspecified: Secondary | ICD-10-CM | POA: Diagnosis not present

## 2017-03-13 DIAGNOSIS — G4733 Obstructive sleep apnea (adult) (pediatric): Secondary | ICD-10-CM | POA: Diagnosis not present

## 2017-04-01 ENCOUNTER — Encounter: Payer: Self-pay | Admitting: Internal Medicine

## 2017-04-10 DIAGNOSIS — G4733 Obstructive sleep apnea (adult) (pediatric): Secondary | ICD-10-CM | POA: Diagnosis not present

## 2017-04-10 DIAGNOSIS — G4710 Hypersomnia, unspecified: Secondary | ICD-10-CM | POA: Diagnosis not present

## 2017-04-12 DIAGNOSIS — G4710 Hypersomnia, unspecified: Secondary | ICD-10-CM | POA: Diagnosis not present

## 2017-04-12 DIAGNOSIS — G4733 Obstructive sleep apnea (adult) (pediatric): Secondary | ICD-10-CM | POA: Diagnosis not present

## 2017-04-26 ENCOUNTER — Encounter: Payer: Self-pay | Admitting: Internal Medicine

## 2017-05-11 DIAGNOSIS — G4710 Hypersomnia, unspecified: Secondary | ICD-10-CM | POA: Diagnosis not present

## 2017-05-11 DIAGNOSIS — G4733 Obstructive sleep apnea (adult) (pediatric): Secondary | ICD-10-CM | POA: Diagnosis not present

## 2017-05-12 ENCOUNTER — Encounter: Payer: 59 | Admitting: Family Medicine

## 2017-05-12 DIAGNOSIS — G4733 Obstructive sleep apnea (adult) (pediatric): Secondary | ICD-10-CM | POA: Diagnosis not present

## 2017-05-12 DIAGNOSIS — G4710 Hypersomnia, unspecified: Secondary | ICD-10-CM | POA: Diagnosis not present

## 2017-05-15 ENCOUNTER — Encounter: Payer: Self-pay | Admitting: Family Medicine

## 2017-05-15 ENCOUNTER — Ambulatory Visit (INDEPENDENT_AMBULATORY_CARE_PROVIDER_SITE_OTHER): Payer: 59 | Admitting: Family Medicine

## 2017-05-15 VITALS — BP 130/62 | HR 92 | Temp 97.9°F | Wt 297.0 lb

## 2017-05-15 DIAGNOSIS — Z13220 Encounter for screening for lipoid disorders: Secondary | ICD-10-CM | POA: Diagnosis not present

## 2017-05-15 DIAGNOSIS — Z0000 Encounter for general adult medical examination without abnormal findings: Secondary | ICD-10-CM

## 2017-05-15 DIAGNOSIS — Z131 Encounter for screening for diabetes mellitus: Secondary | ICD-10-CM

## 2017-05-15 DIAGNOSIS — S46912A Strain of unspecified muscle, fascia and tendon at shoulder and upper arm level, left arm, initial encounter: Secondary | ICD-10-CM

## 2017-05-15 DIAGNOSIS — Z125 Encounter for screening for malignant neoplasm of prostate: Secondary | ICD-10-CM

## 2017-05-15 LAB — CBC WITH DIFFERENTIAL/PLATELET
BASOS PCT: 0.4 % (ref 0.0–3.0)
Basophils Absolute: 0 10*3/uL (ref 0.0–0.1)
EOS PCT: 3.4 % (ref 0.0–5.0)
Eosinophils Absolute: 0.2 10*3/uL (ref 0.0–0.7)
HCT: 43 % (ref 39.0–52.0)
HEMOGLOBIN: 14.5 g/dL (ref 13.0–17.0)
LYMPHS ABS: 2.1 10*3/uL (ref 0.7–4.0)
Lymphocytes Relative: 29.9 % (ref 12.0–46.0)
MCHC: 33.7 g/dL (ref 30.0–36.0)
MCV: 89.2 fl (ref 78.0–100.0)
MONO ABS: 0.5 10*3/uL (ref 0.1–1.0)
Monocytes Relative: 6.9 % (ref 3.0–12.0)
NEUTROS ABS: 4.2 10*3/uL (ref 1.4–7.7)
Neutrophils Relative %: 59.4 % (ref 43.0–77.0)
PLATELETS: 204 10*3/uL (ref 150.0–400.0)
RBC: 4.82 Mil/uL (ref 4.22–5.81)
RDW: 13.7 % (ref 11.5–15.5)
WBC: 7.2 10*3/uL (ref 4.0–10.5)

## 2017-05-15 LAB — BASIC METABOLIC PANEL
BUN: 9 mg/dL (ref 6–23)
CHLORIDE: 105 meq/L (ref 96–112)
CO2: 23 meq/L (ref 19–32)
Calcium: 9.3 mg/dL (ref 8.4–10.5)
Creatinine, Ser: 0.97 mg/dL (ref 0.40–1.50)
GFR: 102.42 mL/min (ref >60.00)
GLUCOSE: 102 mg/dL — AB (ref 70–99)
POTASSIUM: 4.2 meq/L (ref 3.5–5.1)
SODIUM: 141 meq/L (ref 135–145)

## 2017-05-15 LAB — LIPID PANEL
CHOL/HDL RATIO: 5
CHOLESTEROL: 192 mg/dL (ref 0–200)
HDL: 37.8 mg/dL — AB (ref >39.00)
LDL CALC: 121 mg/dL — AB (ref 0–99)
NonHDL: 154.03
TRIGLYCERIDES: 164 mg/dL — AB (ref 0.0–149.0)
VLDL: 32.8 mg/dL (ref 0.0–40.0)

## 2017-05-15 LAB — PSA: PSA: 0.42 ng/mL (ref 0.10–4.00)

## 2017-05-15 LAB — HEMOGLOBIN A1C: Hgb A1c MFr Bld: 5 % (ref 4.6–6.5)

## 2017-05-15 NOTE — Patient Instructions (Signed)
Preventive Care 40-64 Years, Male Preventive care refers to lifestyle choices and visits with your health care provider that can promote health and wellness. What does preventive care include?  A yearly physical exam. This is also called an annual well check.  Dental exams once or twice a year.  Routine eye exams. Ask your health care provider how often you should have your eyes checked.  Personal lifestyle choices, including: ? Daily care of your teeth and gums. ? Regular physical activity. ? Eating a healthy diet. ? Avoiding tobacco and drug use. ? Limiting alcohol use. ? Practicing safe sex. ? Taking low-dose aspirin every day starting at age 77. What happens during an annual well check? The services and screenings done by your health care provider during your annual well check will depend on your age, overall health, lifestyle risk factors, and family history of disease. Counseling Your health care provider may ask you questions about your:  Alcohol use.  Tobacco use.  Drug use.  Emotional well-being.  Home and relationship well-being.  Sexual activity.  Eating habits.  Work and work Statistician.  Screening You may have the following tests or measurements:  Height, weight, and BMI.  Blood pressure.  Lipid and cholesterol levels. These may be checked every 5 years, or more frequently if you are over 40 years old.  Skin check.  Lung cancer screening. You may have this screening every year starting at age 61 if you have a 30-pack-year history of smoking and currently smoke or have quit within the past 15 years.  Fecal occult blood test (FOBT) of the stool. You may have this test every year starting at age 81.  Flexible sigmoidoscopy or colonoscopy. You may have a sigmoidoscopy every 5 years or a colonoscopy every 10 years starting at age 48.  Prostate cancer screening. Recommendations will vary depending on your family history and other risks.  Hepatitis C  blood test.  Hepatitis B blood test.  Sexually transmitted disease (STD) testing.  Diabetes screening. This is done by checking your blood sugar (glucose) after you have not eaten for a while (fasting). You may have this done every 1-3 years.  Discuss your test results, treatment options, and if necessary, the need for more tests with your health care provider. Vaccines Your health care provider may recommend certain vaccines, such as:  Influenza vaccine. This is recommended every year.  Tetanus, diphtheria, and acellular pertussis (Tdap, Td) vaccine. You may need a Td booster every 10 years.  Varicella vaccine. You may need this if you have not been vaccinated.  Zoster vaccine. You may need this after age 21.  Measles, mumps, and rubella (MMR) vaccine. You may need at least one dose of MMR if you were born in 1957 or later. You may also need a second dose.  Pneumococcal 13-valent conjugate (PCV13) vaccine. You may need this if you have certain conditions and have not been vaccinated.  Pneumococcal polysaccharide (PPSV23) vaccine. You may need one or two doses if you smoke cigarettes or if you have certain conditions.  Meningococcal vaccine. You may need this if you have certain conditions.  Hepatitis A vaccine. You may need this if you have certain conditions or if you travel or work in places where you may be exposed to hepatitis A.  Hepatitis B vaccine. You may need this if you have certain conditions or if you travel or work in places where you may be exposed to hepatitis B.  Haemophilus influenzae type b (Hib) vaccine.  You may need this if you have certain risk factors.  Talk to your health care provider about which screenings and vaccines you need and how often you need them. This information is not intended to replace advice given to you by your health care provider. Make sure you discuss any questions you have with your health care provider. Document Released: 02/06/2015  Document Revised: 09/30/2015 Document Reviewed: 11/11/2014 Elsevier Interactive Patient Education  2018 [employer].  Muscle Strain A muscle strain (pulled muscle) happens when a muscle is stretched beyond normal length. It happens when a sudden, violent force stretches your muscle too far. Usually, a few of the fibers in your muscle are torn. Muscle strain is common in athletes. Recovery usually takes 1-2 weeks. Complete healing takes 5-6 weeks. Follow these instructions at home:  Follow the PRICE method of treatment to help your injury get better. Do this the first 2-3 days after the injury: ? Protect. Protect the muscle to keep it from getting injured again. ? Rest. Limit your activity and rest the injured body part. ? Ice. Put ice in a plastic bag. Place a towel between your skin and the bag. Then, apply the ice and leave it on from 15-20 minutes each hour. After the third day, switch to moist heat packs. ? Compression. Use a splint or elastic bandage on the injured area for comfort. Do not put it on too tightly. ? Elevate. Keep the injured body part above the level of your heart.  Only take medicine as told by your doctor.  Warm up before doing exercise to prevent future muscle strains. Contact a doctor if:  You have more pain or puffiness (swelling) in the injured area.  You feel numbness, tingling, or notice a loss of strength in the injured area. This information is not intended to replace advice given to you by your health care provider. Make sure you discuss any questions you have with your health care provider. Document Released: 10/20/2007 Document Revised: 06/18/2015 Document Reviewed: 08/09/2012 Elsevier Interactive Patient Education  2017 [employer].

## 2017-05-15 NOTE — Progress Notes (Addendum)
Subjective:     Roger Mendoza. is a 57 y.o. male and is here for a comprehensive physical exam. The patient reports problems - L shoulder pain. Pt endorses L shoulder/neck pain x1 wk. Pain mostly occurs with turning head to the side.  Pt does not recall injury, hearing any pops, pulls, or doing any heavy lifting.  He thinks he slept on it wrong/needs to get a new pillow.  Pt has been taking Ibuprofen.  Pt feels like there was a "knot" in his shoulder.  Social History   Socioeconomic History  . Marital status: Married    Spouse name: Not on file  . Number of children: Not on file  . Years of education: Not on file  . Highest education level: Not on file  Occupational History  . Occupation: loads truck  Social Needs  . Financial resource strain: Not on file  . Food insecurity:    Worry: Not on file    Inability: Not on file  . Transportation needs:    Medical: Not on file    Non-medical: Not on file  Tobacco Use  . Smoking status: Former Smoker    Packs/day: 0.25    Years: 15.00    Pack years: 3.75    Types: Cigarettes    Last attempt to quit: 03/31/2014    Years since quitting: 3.1  . Smokeless tobacco: Never Used  Substance and Sexual Activity  . Alcohol use: Yes    Alcohol/week: 3.0 oz    Types: 5 Glasses of wine per week    Comment: couple glasses of wine per day  . Drug use: No  . Sexual activity: Not on file  Lifestyle  . Physical activity:    Days per week: Not on file    Minutes per session: Not on file  . Stress: Not on file  Relationships  . Social connections:    Talks on phone: Not on file    Gets together: Not on file    Attends religious service: Not on file    Active member of club or organization: Not on file    Attends meetings of clubs or organizations: Not on file    Relationship status: Not on file  . Intimate partner violence:    Fear of current or ex partner: Not on file    Emotionally abused: Not on file    Physically abused: Not on  file    Forced sexual activity: Not on file  Other Topics Concern  . Not on file  Social History Narrative   Married, has 37yo son, works for [employer] at [employer], loading trucks, exercise - walks a lot at work, diet - has cut back on sweets, bread and greasy foods.  Plays golf, used to play softball.  Attends church, deacon.  04/2016   Health Maintenance  Topic Date Due  . COLONOSCOPY  03/05/2017  . INFLUENZA VACCINE  10/06/2017 (Originally 08/24/2017)  . TETANUS/TDAP  09/16/2020  . Hepatitis C Screening  Completed  . HIV Screening  Completed    The following portions of the patient's history were reviewed and updated as appropriate: allergies, current medications, past family history, past medical history, past social history, past surgical history and problem list.  Review of Systems A comprehensive review of systems was negative.  With the exception of L shoulder/neck pain with turning of head to the side. Objective:    BP 130/62 (BP Location: Left Arm, Patient Position: Sitting, Cuff Size: Normal)  Pulse 92   Temp 97.9 F (36.6 C) (Oral)   Wt 297 lb (134.7 kg)   SpO2 96%   BMI 40.28 kg/m  General appearance: alert, cooperative and no distress Head: Normocephalic, without obvious abnormality, atraumatic Eyes: conjunctivae/corneas clear. PERRL, EOM's intact. Fundi benign. Ears: normal TM's and external ear canals both ears Nose: Nares normal. Septum midline. Mucosa normal. No drainage or sinus tenderness. Throat: lips, mucosa, and tongue normal; teeth and gums normal Neck: no adenopathy, no carotid bruit, no JVD, supple, symmetrical, trachea midline and thyroid not enlarged, symmetric, no tenderness/mass/nodules Lungs: clear to auscultation bilaterally Heart: regular rate and rhythm, S1, S2 normal, no murmur, click, rub or gallop Abdomen: soft, non-tender; bowel sounds normal; no masses,  no organomegaly Male genitalia: deferred Extremities: extremities normal,  atraumatic, no cyanosis or edema Pulses: 2+ and symmetric Skin: Skin color, texture, turgor normal. No rashes or lesions Lymph nodes: Cervical, supraclavicular, and axillary nodes normal. Neurologic: Alert and oriented X 3, normal strength and tone. Normal symmetric reflexes. Normal coordination and gait   MSK:  FROM-active and passive, no deformities, TTP of L trapezius at shoulder.  Negative lift off.   Assessment:    Healthy male exam. L should and neck pain     Plan:      Anticipatory guidance given including wearing seatbelts, smoke detectors in the home, increasing physical activity, has been feeling intake of water. -will obtain labs See After Visit Summary for Counseling Recommendations    L shoulder, muscle strain -Supportive care -ice/heat, rest, massage, NSAIDs -given handout.  Advise to change pillows.  F/u prn  Grier Mitts, MD

## 2017-05-17 ENCOUNTER — Encounter: Payer: Self-pay | Admitting: Family Medicine

## 2017-05-17 ENCOUNTER — Other Ambulatory Visit: Payer: Self-pay | Admitting: Family Medicine

## 2017-05-17 ENCOUNTER — Telehealth: Payer: Self-pay | Admitting: Family Medicine

## 2017-05-17 DIAGNOSIS — E785 Hyperlipidemia, unspecified: Secondary | ICD-10-CM

## 2017-05-17 NOTE — Telephone Encounter (Signed)
Pt was made aware of lab results.

## 2017-05-17 NOTE — Telephone Encounter (Signed)
Copied from [place]. Topic: Quick Communication - Office Called Patient >> May 17, 2017 10:17 AM Synthia Innocent wrote: Reason for CRM: returning call

## 2017-05-20 ENCOUNTER — Other Ambulatory Visit: Payer: Self-pay | Admitting: Medical

## 2017-06-12 ENCOUNTER — Other Ambulatory Visit: Payer: Self-pay | Admitting: Medical

## 2017-06-13 DIAGNOSIS — G4733 Obstructive sleep apnea (adult) (pediatric): Secondary | ICD-10-CM | POA: Diagnosis not present

## 2017-06-13 DIAGNOSIS — G4710 Hypersomnia, unspecified: Secondary | ICD-10-CM | POA: Diagnosis not present

## 2017-06-16 ENCOUNTER — Ambulatory Visit (AMBULATORY_SURGERY_CENTER): Payer: Self-pay

## 2017-06-16 VITALS — Ht 72.0 in | Wt 291.8 lb

## 2017-06-16 DIAGNOSIS — Z86010 Personal history of colonic polyps: Secondary | ICD-10-CM

## 2017-06-16 MED ORDER — NA SULFATE-K SULFATE-MG SULF 17.5-3.13-1.6 GM/177ML PO SOLN: 1.0000 | Freq: Once | ORAL | 0 refills | Status: AC

## 2017-06-16 NOTE — Progress Notes (Signed)
Per pt, no allergies to soy or egg products.Pt not taking any weight loss meds or using  O2 at home.  Pt refused emmi video. 

## 2017-06-28 ENCOUNTER — Encounter: Payer: Self-pay | Admitting: Internal Medicine

## 2017-07-04 ENCOUNTER — Encounter: Payer: Self-pay | Admitting: Internal Medicine

## 2017-07-04 ENCOUNTER — Other Ambulatory Visit: Payer: Self-pay

## 2017-07-04 ENCOUNTER — Ambulatory Visit (AMBULATORY_SURGERY_CENTER): Payer: 59 | Admitting: Internal Medicine

## 2017-07-04 VITALS — BP 124/78 | HR 70 | Temp 98.2°F | Resp 15 | Ht 72.0 in | Wt 297.0 lb

## 2017-07-04 DIAGNOSIS — D123 Benign neoplasm of transverse colon: Secondary | ICD-10-CM

## 2017-07-04 DIAGNOSIS — Z86010 Personal history of colonic polyps: Secondary | ICD-10-CM | POA: Diagnosis not present

## 2017-07-04 DIAGNOSIS — D124 Benign neoplasm of descending colon: Secondary | ICD-10-CM | POA: Diagnosis not present

## 2017-07-04 MED ORDER — SODIUM CHLORIDE 0.9 % IV SOLN: 500.0000 mL | Freq: Once | INTRAVENOUS | Status: DC

## 2017-07-04 NOTE — Progress Notes (Signed)
A/ox3 pleased with MAC, report to RN 

## 2017-07-04 NOTE — Patient Instructions (Signed)
YOU HAD AN ENDOSCOPIC PROCEDURE TODAY AT [facility]:   Refer to the procedure report that was given to you for any specific questions about what was found during the examination.  If the procedure report does not answer your questions, please call your gastroenterologist to clarify.  If you requested that your care partner not be given the details of your procedure findings, then the procedure report has been included in a sealed envelope for you to review at your convenience later. Handouts given : Polyps, Hemorrhoids   YOU SHOULD EXPECT: Some feelings of bloating in the abdomen. Passage of more gas than usual.  Walking can help get rid of the air that was put into your GI tract during the procedure and reduce the bloating. If you had a lower endoscopy (such as a colonoscopy or flexible sigmoidoscopy) you may notice spotting of blood in your stool or on the toilet paper. If you underwent a bowel prep for your procedure, you may not have a normal bowel movement for a few days.  Please Note:  You might notice some irritation and congestion in your nose or some drainage.  This is from the oxygen used during your procedure.  There is no need for concern and it should clear up in a day or so.  SYMPTOMS TO REPORT IMMEDIATELY:   Following lower endoscopy (colonoscopy or flexible sigmoidoscopy):  Excessive amounts of blood in the stool  Significant tenderness or worsening of abdominal pains  Swelling of the abdomen that is new, acute  Fever of 100F or higher   For urgent or emergent issues, a gastroenterologist can be reached at any hour by calling [phone].   DIET:  We do recommend a small meal at first, but then you may proceed to your regular diet.  Drink plenty of fluids but you should avoid alcoholic beverages for 24 hours.  ACTIVITY:  You should plan to take it easy for the rest of today and you should NOT DRIVE or use heavy machinery until tomorrow (because of the  sedation medicines used during the test).    FOLLOW UP: Our staff will call the number listed on your records the next business day following your procedure to check on you and address any questions or concerns that you may have regarding the information given to you following your procedure. If we do not reach you, we will leave a message.  However, if you are feeling well and you are not experiencing any problems, there is no need to return our call.  We will assume that you have returned to your regular daily activities without incident.  If any biopsies were taken you will be contacted by phone or by letter within the next 1-3 weeks.  Please call us at [phone] if you have not heard about the biopsies in 3 weeks.    SIGNATURES/CONFIDENTIALITY: You and/or your care partner have signed paperwork which will be entered into your electronic medical record.  These signatures attest to the fact that that the information above on your After Visit Summary has been reviewed and is understood.  Full responsibility of the confidentiality of this discharge information lies with you and/or your care-partner.

## 2017-07-04 NOTE — Progress Notes (Signed)
Pt's states no medical or surgical changes since previsit or office visit. 

## 2017-07-04 NOTE — Progress Notes (Signed)
Spontaneous respirations throughout. VSS. Resting comfortably. To PACU on room air. Report to  RN. 

## 2017-07-04 NOTE — Op Note (Signed)
[facility] Patient Name: Roger Mendoza Procedure Date: 07/04/2017 12:30 PM MRN: [id number] Endoscopist: Jerene Bears , MD Age: 57 Referring MD:  Date of Birth: Feb 26, 1960 Gender: Male Account #: [id number] Procedure:                Colonoscopy Indications:              Surveillance: Personal history of adenomatous                            polyps on last colonoscopy 5 years ago Medicines:                Monitored Anesthesia Care Procedure:                Pre-Anesthesia Assessment:                           - Prior to the procedure, a History and Physical                            was performed, and patient medications and                            allergies were reviewed. The patient's tolerance of                            previous anesthesia was also reviewed. The risks                            and benefits of the procedure and the sedation                            options and risks were discussed with the patient.                            All questions were answered, and informed consent                            was obtained. Prior Anticoagulants: The patient has                            taken no previous anticoagulant or antiplatelet                            agents. ASA Grade Assessment: III - A patient with                            severe systemic disease. After reviewing the risks                            and benefits, the patient was deemed in                            satisfactory condition to undergo the procedure.  After obtaining informed consent, the colonoscope                            was passed under direct vision. Throughout the                            procedure, the patient's blood pressure, pulse, and                            oxygen saturations were monitored continuously. The                            Colonoscope was introduced through the anus and                            advanced to the cecum,  identified by appendiceal                            orifice and ileocecal valve. The colonoscopy was                            performed without difficulty. The patient tolerated                            the procedure well. The quality of the bowel                            preparation was good. The ileocecal valve,                            appendiceal orifice, and rectum were photographed. Scope In: 12:32:20 PM Scope Out: 12:53:18 PM Scope Withdrawal Time: 0 hours 13 minutes 38 seconds  Total Procedure Duration: 0 hours 20 minutes 58 seconds  Findings:                 The digital rectal exam was normal.                           Seven sessile polyps were found in the transverse                            colon. The polyps were 3 to 6 mm in size. These                            polyps were removed with a cold snare. Resection                            and retrieval were complete.                           Three sessile polyps were found in the descending                            colon. The polyps were 2 to  5 mm in size. These                            polyps were removed with a cold snare. Resection                            and retrieval were complete.                           Internal hemorrhoids were found during                            retroflexion. The hemorrhoids were small. Complications:            No immediate complications. Estimated Blood Loss:     Estimated blood loss was minimal. Impression:               - Seven 3 to 6 mm polyps in the transverse colon,                            removed with a cold snare. Resected and retrieved.                           - Three 2 to 5 mm polyps in the descending colon,                            removed with a cold snare. Resected and retrieved.                           - Internal hemorrhoids. Recommendation:           - Patient has a contact number available for                            emergencies. The signs and  symptoms of potential                            delayed complications were discussed with the                            patient. Return to normal activities tomorrow.                            Written discharge instructions were provided to the                            patient.                           - Resume previous diet.                           - Continue present medications.                           - Await pathology results.                           -  Repeat colonoscopy is recommended for                            surveillance. The colonoscopy date will be                            determined after pathology results from today's                            exam become available for review. Jerene Bears, MD 07/04/2017 12:56:32 PM This report has been signed electronically.

## 2017-07-04 NOTE — Progress Notes (Signed)
Called to room to assist during endoscopic procedure.  Patient ID and intended procedure confirmed with present staff. Received instructions for my participation in the procedure from the performing physician.  

## 2017-07-04 NOTE — Progress Notes (Signed)
[place] relieves [place] after report

## 2017-07-05 ENCOUNTER — Telehealth: Payer: Self-pay

## 2017-07-05 NOTE — Telephone Encounter (Signed)
  Follow up Call-  Call back number 07/04/2017  Post procedure Call Back phone  # [phone]  Permission to leave phone message Yes  Some recent data might be hidden     Patient questions:  Do you have a fever, pain , or abdominal swelling? No. Pain Score  0 *  Have you tolerated food without any problems? Yes.    Have you been able to return to your normal activities? Yes.    Do you have any questions about your discharge instructions: Diet   No. Medications  No. Follow up visit  No.  Do you have questions or concerns about your Care? No.  Actions: * If pain score is 4 or above: No action needed, pain <4.

## 2017-07-06 ENCOUNTER — Encounter: Payer: Self-pay | Admitting: Internal Medicine

## 2017-07-13 DIAGNOSIS — G4733 Obstructive sleep apnea (adult) (pediatric): Secondary | ICD-10-CM | POA: Diagnosis not present

## 2017-07-13 DIAGNOSIS — G4710 Hypersomnia, unspecified: Secondary | ICD-10-CM | POA: Diagnosis not present

## 2017-07-27 ENCOUNTER — Other Ambulatory Visit: Payer: Self-pay | Admitting: Medical

## 2017-08-15 DIAGNOSIS — G4710 Hypersomnia, unspecified: Secondary | ICD-10-CM | POA: Diagnosis not present

## 2017-08-15 DIAGNOSIS — G4733 Obstructive sleep apnea (adult) (pediatric): Secondary | ICD-10-CM | POA: Diagnosis not present

## 2017-09-14 DIAGNOSIS — G4733 Obstructive sleep apnea (adult) (pediatric): Secondary | ICD-10-CM | POA: Diagnosis not present

## 2017-09-14 DIAGNOSIS — G4710 Hypersomnia, unspecified: Secondary | ICD-10-CM | POA: Diagnosis not present

## 2017-10-17 DIAGNOSIS — G4733 Obstructive sleep apnea (adult) (pediatric): Secondary | ICD-10-CM | POA: Diagnosis not present

## 2017-10-17 DIAGNOSIS — G4710 Hypersomnia, unspecified: Secondary | ICD-10-CM | POA: Diagnosis not present

## 2017-11-16 ENCOUNTER — Other Ambulatory Visit: Payer: 59

## 2017-11-16 DIAGNOSIS — G4710 Hypersomnia, unspecified: Secondary | ICD-10-CM | POA: Diagnosis not present

## 2017-11-16 DIAGNOSIS — G4733 Obstructive sleep apnea (adult) (pediatric): Secondary | ICD-10-CM | POA: Diagnosis not present

## 2017-11-17 ENCOUNTER — Other Ambulatory Visit: Payer: Self-pay

## 2017-11-17 ENCOUNTER — Other Ambulatory Visit (INDEPENDENT_AMBULATORY_CARE_PROVIDER_SITE_OTHER): Payer: 59

## 2017-11-17 DIAGNOSIS — E785 Hyperlipidemia, unspecified: Secondary | ICD-10-CM

## 2017-11-17 LAB — LIPID PANEL
CHOLESTEROL: 189 mg/dL (ref 0–200)
HDL: 35.1 mg/dL — AB (ref >39.00)
LDL Cholesterol: 114 mg/dL — ABNORMAL HIGH (ref 0–99)
NonHDL: 153.49
Total CHOL/HDL Ratio: 5
Triglycerides: 196 mg/dL — ABNORMAL HIGH (ref 0.0–149.0)
VLDL: 39.2 mg/dL (ref 0.0–40.0)

## 2017-11-17 MED ORDER — PRAVASTATIN SODIUM 20 MG PO TABS: 20.0000 mg | ORAL_TABLET | Freq: Every day | ORAL | 1 refills | Status: DC

## 2017-12-18 DIAGNOSIS — G4710 Hypersomnia, unspecified: Secondary | ICD-10-CM | POA: Diagnosis not present

## 2017-12-18 DIAGNOSIS — G4733 Obstructive sleep apnea (adult) (pediatric): Secondary | ICD-10-CM | POA: Diagnosis not present

## 2017-12-28 ENCOUNTER — Telehealth: Payer: Self-pay | Admitting: Medical

## 2017-12-28 NOTE — Telephone Encounter (Signed)
Dismissal letter in [employer]  °

## 2018-01-04 DIAGNOSIS — H40013 Open angle with borderline findings, low risk, bilateral: Secondary | ICD-10-CM | POA: Diagnosis not present

## 2018-01-18 DIAGNOSIS — G4733 Obstructive sleep apnea (adult) (pediatric): Secondary | ICD-10-CM | POA: Diagnosis not present

## 2018-01-18 DIAGNOSIS — G4710 Hypersomnia, unspecified: Secondary | ICD-10-CM | POA: Diagnosis not present

## 2018-02-09 ENCOUNTER — Ambulatory Visit: Payer: Self-pay | Admitting: *Deleted

## 2018-02-09 NOTE — Telephone Encounter (Signed)
Patient is calling and states he has a red spot on his stomach that is not a bump and is not raised but the red spot has now gotten bigger. He would like to know if its something that needs to be looked at.   Reason for Disposition . Caller is uncertain what it is  Answer Assessment - Initial Assessment Questions 1. APPEARANCE of LESION: "What does it look like?"      Red area- flat to skin 2. SIZE: "How big is it?" (e.g., compare to size of pinhead, tip of pen, eraser, coin, pea, grape, ping pong ball; or size in cms or inches)      Quarter size 3. COLOR: "What color is it?" "Is there more than one color?"     Red- bright red 4. SHAPE: "What shape is it?" (e.g., round, irregular)     Irregular in shape 5. RAISED: "Does it stick up above the skin or is it flat?" (e.g., raised or elevated)     Not raised 6. TENDER: "Does it hurt when you touch it?"  (Scale 1-10; or mild, moderate, severe)     No pain- sometimes it itches 7. LOCATION: "Where is it located?"      Above [employer] 8. ONSET: "When did it first appear?"      Started small- 6-7 months 9. NUMBER: "Is there just one?" or "Are there others?"     1 area 10. CAUSE: "What do you think it is?"       Not sure 11. OTHER SYMPTOMS: "Do you have any other symptoms?" (e.g., fever)       Itches at times- whitish around area 12. PREGNANCY: "Is there any chance you are pregnant?" "When was your last menstrual period?"       n/a  Protocols used: [place]

## 2018-02-12 ENCOUNTER — Ambulatory Visit (INDEPENDENT_AMBULATORY_CARE_PROVIDER_SITE_OTHER): Payer: 59

## 2018-02-12 ENCOUNTER — Other Ambulatory Visit: Payer: Self-pay

## 2018-02-12 ENCOUNTER — Ambulatory Visit
Admission: RE | Admit: 2018-02-12 | Discharge: 2018-02-12 | Disposition: A | Discharge status: Home / Self Care | Payer: 59 | Source: Ambulatory Visit | Attending: Family Medicine | Admitting: Family Medicine

## 2018-02-12 ENCOUNTER — Encounter: Payer: Self-pay | Admitting: Family Medicine

## 2018-02-12 ENCOUNTER — Ambulatory Visit: Payer: 59 | Admitting: Family Medicine

## 2018-02-12 VITALS — BP 120/68 | HR 97 | Temp 98.4°F | Wt 300.0 lb

## 2018-02-12 DIAGNOSIS — L309 Dermatitis, unspecified: Secondary | ICD-10-CM | POA: Diagnosis not present

## 2018-02-12 DIAGNOSIS — R300 Dysuria: Secondary | ICD-10-CM | POA: Diagnosis not present

## 2018-02-12 DIAGNOSIS — N401 Enlarged prostate with lower urinary tract symptoms: Secondary | ICD-10-CM | POA: Diagnosis not present

## 2018-02-12 DIAGNOSIS — M25512 Pain in left shoulder: Secondary | ICD-10-CM

## 2018-02-12 DIAGNOSIS — Q740 Other congenital malformations of upper limb(s), including shoulder girdle: Secondary | ICD-10-CM

## 2018-02-12 DIAGNOSIS — K644 Residual hemorrhoidal skin tags: Secondary | ICD-10-CM

## 2018-02-12 DIAGNOSIS — G8929 Other chronic pain: Secondary | ICD-10-CM

## 2018-02-12 DIAGNOSIS — R3914 Feeling of incomplete bladder emptying: Secondary | ICD-10-CM | POA: Diagnosis not present

## 2018-02-12 DIAGNOSIS — M25712 Osteophyte, left shoulder: Secondary | ICD-10-CM | POA: Diagnosis not present

## 2018-02-12 MED ORDER — HYDROCORTISONE 2.5 % RE CREA: 1.0000 "application " | TOPICAL_CREAM | Freq: Two times a day (BID) | RECTAL | 0 refills | Status: DC

## 2018-02-12 MED ORDER — SILDENAFIL CITRATE 20 MG PO TABS: ORAL_TABLET | ORAL | 2 refills | Status: DC

## 2018-02-12 MED ORDER — HYDROCORTISONE 1 % EX CREA: 1.0000 "application " | TOPICAL_CREAM | Freq: Two times a day (BID) | CUTANEOUS | 0 refills | Status: AC

## 2018-02-12 MED ORDER — ASPIRIN 81 MG PO TBEC: 81.0000 mg | DELAYED_RELEASE_TABLET | Freq: Every day | ORAL | 3 refills | Status: DC

## 2018-02-12 NOTE — Progress Notes (Signed)
Subjective:    Patient ID: Roger Cal., male    DOB: 08-04-1960, 58 y.o.   MRN: [id number]  No chief complaint on file.   HPI Patient was seen today for ongoing concerns.  Pt endorses slightly erythematous area of skin on abdomen.  Pt states is been present x1 year, is pruritic, has become slightly larger.  Pt has not tried anything for this.  Pt denies similar lesions elsewhere.  Pt denies changes in soaps, lotions, detergents.  Left shoulder bump: Pt endorses a bump on his left shoulder x 1 yr.  Pt states the area occasionally hurts.  Pt able to move arm/shoulder without difficulty.  Pt denies injury to the area.  Pt had shoulder at his last surgery 8 years ago to "clean out his rotator cuff".  Pt has tried ice on the area.  Past Medical History:  Diagnosis Date  . Allergic rhinitis, seasonal   . Allergy    seasonal  . Colon polyp 02/2012   tubular adenoma, Dr. Zenovia Jarred  . De Quervain's tenosynovitis, right    hx/o  . Epididymitis    hx/o as adult  . Erectile dysfunction   . Family history of premature CAD    father  . Hemorrhoids    hx of  . Hyperlipidemia   . Left inguinal hernia    tiny as of 03/2014  . MRSA (methicillin resistant staph aureus) culture positive    history of MRSA skin on pt's back/ unsure of date  . Normal cardiac stress test    remote past per patient, [place], [place]  . Obesity   . OSA on CPAP   . Recurrent UTI    Dr. Janice Norrie, [facility]  . Sleep apnea    uses c-pap  . Tinea pedis   . Tobacco use    4 cigarrettes daily  . Wears glasses     No Known Allergies  ROS General: Denies fever, chills, night sweats, changes in weight, changes in appetite HEENT: Denies headaches, ear pain, changes in vision, rhinorrhea, sore throat CV: Denies CP, palpitations, SOB, orthopnea Pulm: Denies SOB, cough, wheezing GI: Denies abdominal pain, nausea, vomiting, diarrhea, constipation GU: Denies dysuria, hematuria, frequency, vaginal  discharge Msk: Denies muscle cramps, joint pains  +L shoulder Neuro: Denies weakness, numbness, tingling Skin: Denies rashes, bruising  +area of erythema on abdomen. Psych: Denies depression, anxiety, hallucinations    Objective:    Blood pressure 120/68, pulse 97, temperature 98.4 F (36.9 C), temperature source Oral, weight 300 lb (136.1 kg), SpO2 90 %.  Gen. Pleasant, well-nourished, in no distress, normal affect   HEENT: [place]/AT, face symmetric, no scleral icterus, PERRLA, nares patent without drainage Lungs: no accessory muscle use Cardiovascular: RRR, no peripheral edema Musculoskeletal: L shoulder/distal clavicle with raised hard deformity, no edema or erythema.  FROM of b/l UEs.  No cyanosis or clubbing, normal tone Neuro:  A&Ox3, CN II-XII intact, normal gait Skin:  Warm, no rash.  Slightly erythematous area with dry appearace on abdomen, 1.5 cm x 1 cm, flat.  Area does not have central area of clearing with raised border.  Wt Readings from Last 3 Encounters:  02/12/18 300 lb (136.1 kg)  07/04/17 297 lb (134.7 kg)  06/16/17 291 lb 12.8 oz (132.4 kg)    Lab Results  Component Value Date   WBC 7.2 05/15/2017   HGB 14.5 05/15/2017   HCT 43.0 05/15/2017   PLT 204.0 05/15/2017   GLUCOSE 102 (H) 05/15/2017  CHOL 189 11/17/2017   TRIG 196.0 (H) 11/17/2017   HDL 35.10 (L) 11/17/2017   LDLDIRECT 142 (H) 01/04/2012   LDLCALC 114 (H) 11/17/2017   ALT 26 05/11/2015   AST 23 05/11/2015   NA 141 05/15/2017   K 4.2 05/15/2017   CL 105 05/15/2017   CREATININE 0.97 05/15/2017   BUN 9 05/15/2017   CO2 23 05/15/2017   TSH 1.41 05/11/2016   PSA 0.42 05/15/2017   HGBA1C 5.0 05/15/2017    Assessment/Plan:  Dermatitis  -does not appear to be tinea. -given handout - Plan: hydrocortisone cream 1 %  Clavicular asymmetry  -no recent injury -obtain imaging.  Ortho referral if needed. - Plan: DG Shoulder Left, DG Shoulder Left  Chronic left shoulder pain  -discussed NSAIDs  prn for pain/discomfort. - Plan: DG Shoulder Left, DG Shoulder Left  F/u prn  Grier Mitts, MD

## 2018-02-12 NOTE — Patient Instructions (Signed)
Atopic Dermatitis Atopic dermatitis is a skin disorder that causes inflammation of the skin. This is the most common type of eczema. Eczema is a group of skin conditions that cause the skin to be itchy, red, and swollen. This condition is generally worse during the cooler winter months and often improves during the warm summer months. Symptoms can vary from person to person. Atopic dermatitis usually starts showing signs in infancy and can last through adulthood. This condition cannot be passed from one person to another (non-contagious), but it is more common in families. Atopic dermatitis may not always be present. When it is present, it is called a flare-up. What are the causes? The exact cause of this condition is not known. Flare-ups of the condition may be triggered by:  Contact with something that you are sensitive or allergic to.  Stress.  Certain foods.  Extremely hot or cold weather.  Harsh chemicals and soaps.  Dry air.  Chlorine. What increases the risk? This condition is more likely to develop in people who have a personal history or family history of eczema, allergies, asthma, or hay fever. What are the signs or symptoms? Symptoms of this condition include:  Dry, scaly skin.  Red, itchy rash.  Itchiness, which can be severe. This may occur before the skin rash. This can make sleeping difficult.  Skin thickening and cracking that can occur over time. How is this diagnosed? This condition is diagnosed based on your symptoms, a medical history, and a physical exam. How is this treated? There is no cure for this condition, but symptoms can usually be controlled. Treatment focuses on:  Controlling the itchiness and scratching. You may be given medicines, such as antihistamines or steroid creams.  Limiting exposure to things that you are sensitive or allergic to (allergens).  Recognizing situations that cause stress and developing a plan to manage stress. If your  atopic dermatitis does not get better with medicines, or if it is all over your body (widespread), a treatment using a specific type of light (phototherapy) may be used. Follow these instructions at home: Skin care   Keep your skin well-moisturized. Doing this seals in moisture and helps to prevent dryness. ? Use unscented lotions that have petroleum in them. ? Avoid lotions that contain alcohol or water. They can dry the skin.  Keep baths or showers short (less than 5 minutes) in warm water. Do not use hot water. ? Use mild, unscented cleansers for bathing. Avoid soap and bubble bath. ? Apply a moisturizer to your skin right after a bath or shower.  Do not apply anything to your skin without checking with your health care provider. General instructions  Dress in clothes made of cotton or cotton blends. Dress lightly because heat increases itchiness.  When washing your clothes, rinse your clothes twice so all of the soap is removed.  Avoid any triggers that can cause a flare-up.  Try to manage your stress.  Keep your fingernails cut short.  Avoid scratching. Scratching makes the rash and itchiness worse. It may also result in a skin infection (impetigo) due to a break in the skin caused by scratching.  Take or apply over-the-counter and prescription medicines only as told by your health care provider.  Keep all follow-up visits as told by your health care provider. This is important.  Do not be around people who have cold sores or fever blisters. If you get the infection, it may cause your atopic dermatitis to worsen. Contact a health   care provider if:  Your itchiness interferes with sleep.  Your rash gets worse or it is not better within one week of starting treatment.  You have a fever.  You have a rash flare-up after having contact with someone who has cold sores or fever blisters. Get help right away if:  You develop pus or soft yellow scabs in the rash  area. Summary  This condition causes a red rash and itchy, dry, scaly skin.  Treatment focuses on controlling the itchiness and scratching, limiting exposure to things that you are sensitive or allergic to (allergens), recognizing situations that cause stress, and developing a plan to manage stress.  Keep your skin well-moisturized.  Keep baths or showers shorter than 5 minutes and use warm water. Do not use hot water. This information is not intended to replace advice given to you by your health care provider. Make sure you discuss any questions you have with your health care provider. Document Released: 01/08/2000 Document Revised: 02/12/2016 Document Reviewed: 02/12/2016 Elsevier Interactive Patient Education  2019 [employer]  Shoulder Pain Many things can cause shoulder pain, including:  An injury to the shoulder.  Overuse of the shoulder.  Arthritis. The source of the pain can be:  Inflammation.  An injury to the shoulder joint.  An injury to a tendon, ligament, or bone. Follow these instructions at home: Pay attention to changes in your symptoms. Let your health care provider know about them. Follow these instructions to relieve your pain. If you have a sling:  Wear the sling as told by your health care provider. Remove it only as told by your health care provider.  Loosen the sling if your fingers tingle, become numb, or turn cold and blue.  Keep the sling clean.  If the sling is not waterproof: ? Do not let it get wet. Remove it to shower or bathe.  Move your arm as little as possible, but keep your hand moving to prevent swelling. Managing pain, stiffness, and swelling   If directed, put ice on the painful area: ? Put ice in a plastic bag. ? Place a towel between your skin and the bag. ? Leave the ice on for 20 minutes, 2-3 times per day. Stop applying ice if it does not help with the pain.  Squeeze a soft ball or a foam pad as much as possible. This  helps to keep the shoulder from swelling. It also helps to strengthen the arm. General instructions  Take over-the-counter and prescription medicines only as told by your health care provider.  Keep all follow-up visits as told by your health care provider. This is important. Contact a health care provider if:  Your pain gets worse.  Your pain is not relieved with medicines.  New pain develops in your arm, hand, or fingers. Get help right away if:  Your arm, hand, or fingers: ? Tingle. ? Become numb. ? Become swollen. ? Become painful. ? Turn white or blue. Summary  Shoulder pain can be caused by an injury, overuse, or arthritis.  Pay attention to changes in your symptoms. Let your health care provider know about them.  This condition may be treated with a sling, ice, and pain medicines.  Contact your health care provider if the pain gets worse or new pain develops. Get help right away if your arm, hand, or fingers tingle or become numb, swollen, or painful.  Keep all follow-up visits as told by your health care provider. This is important. This information  is not intended to replace advice given to you by your health care provider. Make sure you discuss any questions you have with your health care provider. Document Released: 10/20/2004 Document Revised: 07/25/2017 Document Reviewed: 07/25/2017 Elsevier Interactive Patient Education  2019 [employer].

## 2018-02-17 DIAGNOSIS — G4733 Obstructive sleep apnea (adult) (pediatric): Secondary | ICD-10-CM | POA: Diagnosis not present

## 2018-02-17 DIAGNOSIS — G4710 Hypersomnia, unspecified: Secondary | ICD-10-CM | POA: Diagnosis not present

## 2018-03-15 DIAGNOSIS — N401 Enlarged prostate with lower urinary tract symptoms: Secondary | ICD-10-CM | POA: Diagnosis not present

## 2018-03-15 DIAGNOSIS — R300 Dysuria: Secondary | ICD-10-CM | POA: Diagnosis not present

## 2018-03-19 ENCOUNTER — Other Ambulatory Visit: Payer: Self-pay | Admitting: Family Medicine

## 2018-03-19 ENCOUNTER — Other Ambulatory Visit: Payer: Self-pay

## 2018-03-19 DIAGNOSIS — J302 Other seasonal allergic rhinitis: Secondary | ICD-10-CM

## 2018-03-19 MED ORDER — CETIRIZINE HCL 10 MG PO TABS: 10.0000 mg | ORAL_TABLET | Freq: Every day | ORAL | 5 refills | Status: DC

## 2018-03-20 DIAGNOSIS — G4733 Obstructive sleep apnea (adult) (pediatric): Secondary | ICD-10-CM | POA: Diagnosis not present

## 2018-03-20 DIAGNOSIS — G4710 Hypersomnia, unspecified: Secondary | ICD-10-CM | POA: Diagnosis not present

## 2018-04-19 DIAGNOSIS — G4710 Hypersomnia, unspecified: Secondary | ICD-10-CM | POA: Diagnosis not present

## 2018-04-19 DIAGNOSIS — G4733 Obstructive sleep apnea (adult) (pediatric): Secondary | ICD-10-CM | POA: Diagnosis not present

## 2018-05-19 DIAGNOSIS — G4733 Obstructive sleep apnea (adult) (pediatric): Secondary | ICD-10-CM | POA: Diagnosis not present

## 2018-05-19 DIAGNOSIS — G4710 Hypersomnia, unspecified: Secondary | ICD-10-CM | POA: Diagnosis not present

## 2018-06-08 ENCOUNTER — Other Ambulatory Visit: Payer: Self-pay | Admitting: Family Medicine

## 2018-06-19 DIAGNOSIS — G4710 Hypersomnia, unspecified: Secondary | ICD-10-CM | POA: Diagnosis not present

## 2018-06-19 DIAGNOSIS — G4733 Obstructive sleep apnea (adult) (pediatric): Secondary | ICD-10-CM | POA: Diagnosis not present

## 2018-07-11 ENCOUNTER — Encounter: Payer: 59 | Admitting: Family Medicine

## 2018-08-15 ENCOUNTER — Ambulatory Visit (INDEPENDENT_AMBULATORY_CARE_PROVIDER_SITE_OTHER): Payer: 59 | Admitting: Family Medicine

## 2018-08-15 ENCOUNTER — Encounter: Payer: Self-pay | Admitting: Family Medicine

## 2018-08-15 ENCOUNTER — Ambulatory Visit (INDEPENDENT_AMBULATORY_CARE_PROVIDER_SITE_OTHER): Payer: 59

## 2018-08-15 ENCOUNTER — Other Ambulatory Visit: Payer: Self-pay

## 2018-08-15 VITALS — BP 120/72 | HR 86 | Temp 98.8°F | Ht 71.5 in | Wt 299.4 lb

## 2018-08-15 DIAGNOSIS — G8929 Other chronic pain: Secondary | ICD-10-CM | POA: Diagnosis not present

## 2018-08-15 DIAGNOSIS — Z6841 Body Mass Index (BMI) 40.0 and over, adult: Secondary | ICD-10-CM

## 2018-08-15 DIAGNOSIS — M5441 Lumbago with sciatica, right side: Secondary | ICD-10-CM

## 2018-08-15 DIAGNOSIS — M5442 Lumbago with sciatica, left side: Secondary | ICD-10-CM

## 2018-08-15 DIAGNOSIS — E782 Mixed hyperlipidemia: Secondary | ICD-10-CM

## 2018-08-15 DIAGNOSIS — M958 Other specified acquired deformities of musculoskeletal system: Secondary | ICD-10-CM | POA: Diagnosis not present

## 2018-08-15 DIAGNOSIS — Z0000 Encounter for general adult medical examination without abnormal findings: Secondary | ICD-10-CM | POA: Diagnosis not present

## 2018-08-15 DIAGNOSIS — R2232 Localized swelling, mass and lump, left upper limb: Secondary | ICD-10-CM

## 2018-08-15 LAB — HEMOGLOBIN A1C: Hgb A1c MFr Bld: 4.9 % (ref 4.6–6.5)

## 2018-08-15 LAB — BASIC METABOLIC PANEL
BUN: 10 mg/dL (ref 6–23)
CO2: 29 mEq/L (ref 19–32)
Calcium: 9.5 mg/dL (ref 8.4–10.5)
Chloride: 104 mEq/L (ref 96–112)
Creatinine, Ser: 0.98 mg/dL (ref 0.40–1.50)
GFR: 94.81 mL/min (ref >60.00)
Glucose, Bld: 90 mg/dL (ref 70–99)
Potassium: 4 mEq/L (ref 3.5–5.1)
Sodium: 141 mEq/L (ref 135–145)

## 2018-08-15 LAB — LIPID PANEL
Cholesterol: 180 mg/dL (ref 0–200)
HDL: 38.1 mg/dL — ABNORMAL LOW (ref >39.00)
LDL Cholesterol: 115 mg/dL — ABNORMAL HIGH (ref 0–99)
NonHDL: 142.21
Total CHOL/HDL Ratio: 5
Triglycerides: 134 mg/dL (ref 0.0–149.0)
VLDL: 26.8 mg/dL (ref 0.0–40.0)

## 2018-08-15 LAB — CBC WITH DIFFERENTIAL/PLATELET
Basophils Absolute: 0 10*3/uL (ref 0.0–0.1)
Basophils Relative: 0.3 % (ref 0.0–3.0)
Eosinophils Absolute: 0.3 10*3/uL (ref 0.0–0.7)
Eosinophils Relative: 3.1 % (ref 0.0–5.0)
HCT: 40.2 % (ref 39.0–52.0)
Hemoglobin: 13.5 g/dL (ref 13.0–17.0)
Lymphocytes Relative: 28.1 % (ref 12.0–46.0)
Lymphs Abs: 2.4 10*3/uL (ref 0.7–4.0)
MCHC: 33.7 g/dL (ref 30.0–36.0)
MCV: 91.6 fl (ref 78.0–100.0)
Monocytes Absolute: 0.6 10*3/uL (ref 0.1–1.0)
Monocytes Relative: 6.7 % (ref 3.0–12.0)
Neutro Abs: 5.3 10*3/uL (ref 1.4–7.7)
Neutrophils Relative %: 61.8 % (ref 43.0–77.0)
Platelets: 239 10*3/uL (ref 150.0–400.0)
RBC: 4.39 Mil/uL (ref 4.22–5.81)
RDW: 13.3 % (ref 11.5–15.5)
WBC: 8.5 10*3/uL (ref 4.0–10.5)

## 2018-08-15 NOTE — Patient Instructions (Signed)
Preventive Care 40-59 Years Old, Male °Preventive care refers to lifestyle choices and visits with your health care provider that can promote health and wellness. This includes: °· A yearly physical exam. This is also called an annual well check. °· Regular dental and eye exams. °· Immunizations. °· Screening for certain conditions. °· Healthy lifestyle choices, such as eating a healthy diet, getting regular exercise, not using drugs or products that contain nicotine and tobacco, and limiting alcohol use. °What can I expect for my preventive care visit? °Physical exam °Your health care provider will check: °· Height and weight. These may be used to calculate body mass index (BMI), which is a measurement that tells if you are at a healthy weight. °· Heart rate and blood pressure. °· Your skin for abnormal spots. °Counseling °Your health care provider may ask you questions about: °· Alcohol, tobacco, and drug use. °· Emotional well-being. °· Home and relationship well-being. °· Sexual activity. °· Eating habits. °· Work and work environment. °What immunizations do I need? ° °Influenza (flu) vaccine °· This is recommended every year. °Tetanus, diphtheria, and pertussis (Tdap) vaccine °· You may need a Td booster every 10 years. °Varicella (chickenpox) vaccine °· You may need this vaccine if you have not already been vaccinated. °Zoster (shingles) vaccine °· You may need this after age 60. °Measles, mumps, and rubella (MMR) vaccine °· You may need at least one dose of MMR if you were born in 1957 or later. You may also need a second dose. °Pneumococcal conjugate (PCV13) vaccine °· You may need this if you have certain conditions and were not previously vaccinated. °Pneumococcal polysaccharide (PPSV23) vaccine °· You may need one or two doses if you smoke cigarettes or if you have certain conditions. °Meningococcal conjugate (MenACWY) vaccine °· You may need this if you have certain conditions. °Hepatitis A  vaccine °· You may need this if you have certain conditions or if you travel or work in places where you may be exposed to hepatitis A. °Hepatitis B vaccine °· You may need this if you have certain conditions or if you travel or work in places where you may be exposed to hepatitis B. °Haemophilus influenzae type b (Hib) vaccine °· You may need this if you have certain risk factors. °Human papillomavirus (HPV) vaccine °· If recommended by your health care provider, you may need three doses over 6 months. °You may receive vaccines as individual doses or as more than one vaccine together in one shot (combination vaccines). Talk with your health care provider about the risks and benefits of combination vaccines. °What tests do I need? °Blood tests °· Lipid and cholesterol levels. These may be checked every 5 years, or more frequently if you are over 50 years old. °· Hepatitis C test. °· Hepatitis B test. °Screening °· Lung cancer screening. You may have this screening every year starting at age 55 if you have a 30-pack-year history of smoking and currently smoke or have quit within the past 15 years. °· Prostate cancer screening. Recommendations will vary depending on your family history and other risks. °· Colorectal cancer screening. All adults should have this screening starting at age 50 and continuing until age 75. Your health care provider may recommend screening at age 45 if you are at increased risk. You will have tests every 1-10 years, depending on your results and the type of screening test. °· Diabetes screening. This is done by checking your blood sugar (glucose) after you have not eaten   for a while (fasting). You may have this done every 1-3 years.  Sexually transmitted disease (STD) testing. Follow these instructions at home: Eating and drinking  Eat a diet that includes fresh fruits and vegetables, whole grains, lean protein, and low-fat dairy products.  Take vitamin and mineral supplements as  recommended by your health care provider.  Do not drink alcohol if your health care provider tells you not to drink.  If you drink alcohol: ? Limit how much you have to 0-2 drinks a day. ? Be aware of how much alcohol is in your drink. In the [place] one drink equals one 12 oz bottle of beer (355 mL), one 5 oz glass of wine (148 mL), or one 1 oz glass of hard liquor (44 mL). Lifestyle  Take daily care of your teeth and gums.  Stay active. Exercise for at least 30 minutes on 5 or more days each week.  Do not use any products that contain nicotine or tobacco, such as cigarettes, e-cigarettes, and chewing tobacco. If you need help quitting, ask your health care provider.  If you are sexually active, practice safe sex. Use a condom or other form of protection to prevent STIs (sexually transmitted infections).  Talk with your health care provider about taking a low-dose aspirin every day starting at age 50. What's next?  Go to your health care provider once a year for a well check visit.  Ask your health care provider how often you should have your eyes and teeth checked.  Stay up to date on all vaccines. This information is not intended to replace advice given to you by your health care provider. Make sure you discuss any questions you have with your health care provider. Document Released: 02/06/2015 Document Revised: 01/04/2018 Document Reviewed: 01/04/2018 Elsevier Patient Education  2020 [employer].  Obesity, Adult Obesity is the condition of having too much total body fat. Being overweight or obese means that your weight is greater than what is considered healthy for your body size. Obesity is determined by a measurement called BMI. BMI is an estimate of body fat and is calculated from height and weight. For adults, a BMI of 30 or higher is considered obese. Obesity can lead to other health concerns and major illnesses, including:  Stroke.  Coronary artery disease (CAD).  Type  2 diabetes.  Some types of cancer, including cancers of the colon, breast, uterus, and gallbladder.  Osteoarthritis.  High blood pressure (hypertension).  High cholesterol.  Sleep apnea.  Gallbladder stones.  Infertility problems. What are the causes? Common causes of this condition include:  Eating daily meals that are high in calories, sugar, and fat.  Being born with genes that may make you more likely to become obese.  Having a medical condition that causes obesity, including: ? Hypothyroidism. ? Polycystic ovarian syndrome (PCOS). ? Binge-eating disorder. ? Cushing syndrome.  Taking certain medicines, such as steroids, antidepressants, and seizure medicines.  Not being physically active (sedentary lifestyle).  Not getting enough sleep.  Drinking high amounts of sugar-sweetened beverages, such as soft drinks. What increases the risk? The following factors may make you more likely to develop this condition:  Having a family history of obesity.  Being a woman of African American descent.  Being a man of Hispanic descent.  Living in an area with limited access to: ? [place], recreation centers, or sidewalks. ? Healthy food choices, such as grocery stores and farmers' markets. What are the signs or symptoms? The main sign  of this condition is having too much body fat. How is this diagnosed? This condition is diagnosed based on:  Your BMI. If you are an adult with a BMI of 30 or higher, you are considered obese.  Your waist circumference. This measures the distance around your waistline.  Your skinfold thickness. Your health care provider may gently pinch a fold of your skin and measure it. You may have other tests to check for underlying conditions. How is this treated? Treatment for this condition often includes changing your lifestyle. Treatment may include some or all of the following:  Dietary changes. This may include developing a healthy meal  plan.  Regular physical activity. This may include activity that causes your heart to beat faster (aerobic exercise) and strength training. Work with your health care provider to design an exercise program that works for you.  Medicine to help you lose weight if you are unable to lose 1 pound a week after 6 weeks of healthy eating and more physical activity.  Treating conditions that cause the obesity (underlying conditions).  Surgery. Surgical options may include gastric banding and gastric bypass. Surgery may be done if: ? Other treatments have not helped to improve your condition. ? You have a BMI of 40 or higher. ? You have life-threatening health problems related to obesity. Follow these instructions at home: Eating and drinking   Follow recommendations from your health care provider about what you eat and drink. Your health care provider may advise you to: ? Limit fast food, sweets, and processed snack foods. ? Choose low-fat options, such as low-fat milk instead of whole milk. ? Eat 5 or more servings of fruits or vegetables every day. ? Eat at home more often. This gives you more control over what you eat. ? Choose healthy foods when you eat out. ? Learn to read food labels. This will help you understand how much food is considered 1 serving. ? Learn what a healthy serving size is. ? Keep low-fat snacks available. ? Limit sugary drinks, such as soda, fruit juice, sweetened iced tea, and flavored milk.  Drink enough water to keep your urine pale yellow.  Do not follow a fad diet. Fad diets can be unhealthy and even dangerous. Physical activity  Exercise regularly, as told by your health care provider. ? Most adults should get up to 150 minutes of moderate-intensity exercise every week. ? Ask your health care provider what types of exercise are safe for you and how often you should exercise.  Warm up and stretch before being active.  Cool down and stretch after being  active.  Rest between periods of activity. Lifestyle  Work with your health care provider and a dietitian to set a weight-loss goal that is healthy and reasonable for you.  Limit your screen time.  Find ways to reward yourself that do not involve food.  Do not drink alcohol if: ? Your health care provider tells you not to drink. ? You are pregnant, may be pregnant, or are planning to become pregnant.  If you drink alcohol: ? Limit how much you use to:  0-1 drink a day for women.  0-2 drinks a day for men. ? Be aware of how much alcohol is in your drink. In the [place] one drink equals one 12 oz bottle of beer (355 mL), one 5 oz glass of wine (148 mL), or one 1 oz glass of hard liquor (44 mL). General instructions  Keep a weight-loss journal to keep  track of the food you eat and how much exercise you get.  Take over-the-counter and prescription medicines only as told by your health care provider.  Take vitamins and supplements only as told by your health care provider.  Consider joining a support group. Your health care provider may be able to recommend a support group.  Keep all follow-up visits as told by your health care provider. This is important. Contact a health care provider if:  You are unable to meet your weight loss goal after 6 weeks of dietary and lifestyle changes. Get help right away if you are having:  Trouble breathing.  Suicidal thoughts or behaviors. Summary  Obesity is the condition of having too much total body fat.  Being overweight or obese means that your weight is greater than what is considered healthy for your body size.  Work with your health care provider and a dietitian to set a weight-loss goal that is healthy and reasonable for you.  Exercise regularly, as told by your health care provider. Ask your health care provider what types of exercise are safe for you and how often you should exercise. This information is not intended to replace  advice given to you by your health care provider. Make sure you discuss any questions you have with your health care provider. Document Released: 02/18/2004 Document Revised: 09/14/2017 Document Reviewed: 09/14/2017 Elsevier Patient Education  [place].  Chronic Back Pain When back pain lasts longer than 3 months, it is called chronic back pain.The cause of your back pain may not be known. Some common causes include:  Wear and tear (degenerative disease) of the bones, ligaments, or disks in your back.  Inflammation and stiffness in your back (arthritis). People who have chronic back pain often go through certain periods in which the pain is more intense (flare-ups). Many people can learn to manage the pain with home care. Follow these instructions at home: Pay attention to any changes in your symptoms. Take these actions to help with your pain: Activity   Avoid bending and other activities that make the problem worse.  Maintain a proper position when standing or sitting: ? When standing, keep your upper back and neck straight, with your shoulders pulled back. Avoid slouching. ? When sitting, keep your back straight and relax your shoulders. Do not round your shoulders or pull them backward.  Do not sit or stand in one place for long periods of time.  Take brief periods of rest throughout the day. This will reduce your pain. Resting in a lying or standing position is usually better than sitting to rest.  When you are resting for longer periods, mix in some mild activity or stretching between periods of rest. This will help to prevent stiffness and pain.  Get regular exercise. Ask your health care provider what activities are safe for you.  Do not lift anything that is heavier than 10 lb (4.5 kg). Always use proper lifting technique, which includes: ? Bending your knees. ? Keeping the load close to your body. ? Avoiding twisting.  Sleep on a firm mattress in a comfortable  position. Try lying on your side with your knees slightly bent. If you lie on your back, put a pillow under your knees. Managing pain  If directed, apply ice to the painful area. Your health care provider may recommend applying ice during the first 24-48 hours after a flare-up begins. ? Put ice in a plastic bag. ? Place a towel between your skin  and the bag. ? Leave the ice on for 20 minutes, 2-3 times per day.  If directed, apply heat to the affected area as often as told by your health care provider. Use the heat source that your health care provider recommends, such as a moist heat pack or a heating pad. ? Place a towel between your skin and the heat source. ? Leave the heat on for 20-30 minutes. ? Remove the heat if your skin turns bright red. This is especially important if you are unable to feel pain, heat, or cold. You may have a greater risk of getting burned.  Try soaking in a warm tub.  Take over-the-counter and prescription medicines only as told by your health care provider.  Keep all follow-up visits as told by your health care provider. This is important. Contact a health care provider if:  You have pain that is not relieved with rest or medicine. Get help right away if:  You have weakness or numbness in one or both of your legs or feet.  You have trouble controlling your bladder or your bowels.  You have nausea or vomiting.  You have pain in your abdomen.  You have shortness of breath or you faint. This information is not intended to replace advice given to you by your health care provider. Make sure you discuss any questions you have with your health care provider. Document Released: 02/18/2004 Document Revised: 05/03/2018 Document Reviewed: 07/20/2016 Elsevier Patient Education  2020 [employer].

## 2018-08-15 NOTE — Progress Notes (Signed)
Subjective:     Roger Mendoza. is a 58 y.o. male and is here for a comprehensive physical exam. The patient reports problems - cloudy urine, bump on L shoulder, knot on finger, and back pain.  Pt states he was seen by Urology, recently treated w/ abx course for Epididymitis but still has cloudy urine.  Trying to drink more water.  Denies dysuria, testicular pain, hematuria.  Pt with a "knot" on L shoulder.  Present x yrs.  Does not recall injury.  Area will hurt at times.  Denies decreased ROM, numbness in LUE.  Pt also mentions low back pain and a "knot" on L 5th digit x yrs.  Finger is not painful and does not lock.  Social History   Socioeconomic History  . Marital status: Married    Spouse name: Not on file  . Number of children: Not on file  . Years of education: Not on file  . Highest education level: Not on file  Occupational History  . Occupation: loads truck  Social Needs  . Financial resource strain: Not on file  . Food insecurity    Worry: Not on file    Inability: Not on file  . Transportation needs    Medical: Not on file    Non-medical: Not on file  Tobacco Use  . Smoking status: Former Smoker    Packs/day: 0.25    Years: 15.00    Pack years: 3.75    Types: Cigarettes    Quit date: 03/31/2014    Years since quitting: 4.3  . Smokeless tobacco: Never Used  Substance and Sexual Activity  . Alcohol use: Yes    Alcohol/week: 15.0 standard drinks    Types: 3 Glasses of wine, 12 Cans of beer per week    Comment: couple glasses of wine per day  . Drug use: No  . Sexual activity: Not on file  Lifestyle  . Physical activity    Days per week: Not on file    Minutes per session: Not on file  . Stress: Not on file  Relationships  . Social Herbalist on phone: Not on file    Gets together: Not on file    Attends religious service: Not on file    Active member of club or organization: Not on file    Attends meetings of clubs or organizations: Not on  file    Relationship status: Not on file  . Intimate partner violence    Fear of current or ex partner: Not on file    Emotionally abused: Not on file    Physically abused: Not on file    Forced sexual activity: Not on file  Other Topics Concern  . Not on file  Social History Narrative   Married, has 1yo son, works for [employer] at [employer], loading trucks, exercise - walks a lot at work, diet - has cut back on sweets, bread and greasy foods.  Plays golf, used to play softball.  Attends church, deacon.  04/2016   Health Maintenance  Topic Date Due  . INFLUENZA VACCINE  08/25/2018  . COLONOSCOPY  07/04/2020  . TETANUS/TDAP  09/16/2020  . Hepatitis C Screening  Completed  . HIV Screening  Completed    The following portions of the patient's history were reviewed and updated as appropriate: allergies, current medications, past family history, past medical history, past social history, past surgical history and problem list.  Review of Systems Pertinent items noted  in HPI and remainder of comprehensive ROS otherwise negative.   Objective:    BP 120/72 (BP Location: Left Arm, Patient Position: Sitting, Cuff Size: Large)   Pulse 86   Temp 98.8 F (37.1 C) (Oral)   Ht 5' 11.5" (1.816 m)   Wt 299 lb 6.4 oz (135.8 kg)   SpO2 93%   BMI 41.18 kg/m  General appearance: alert, cooperative and no distress Head: Normocephalic, without obvious abnormality, atraumatic Eyes: conjunctivae/corneas clear. PERRL, EOM's intact. Fundi benign. Ears: normal TM's and external ear canals both ears Nose: Nares normal. Septum midline. Mucosa normal. No drainage or sinus tenderness. Throat: lips, mucosa, and tongue normal; teeth and gums normal Neck: no adenopathy, no carotid bruit, no JVD, supple, symmetrical, trachea midline and thyroid not enlarged, symmetric, no tenderness/mass/nodules Lungs: clear to auscultation bilaterally Heart: regular rate and rhythm, S1, S2 normal, no murmur, click, rub  or gallop Abdomen: soft, non-tender; bowel sounds normal; no masses,  no organomegaly Extremities: extremities normal, atraumatic, no cyanosis or edema Pulses: 2+ and symmetric Skin: Skin color, texture, turgor normal. No rashes or lesions.  Left fifth digit with firm nodule/cystic lesion on anterior surface. Musculoskeletal: Left distal clavicle with subtle deformity when compared to R.  Firm bony prominence noted.  No  TTP of b/l clavicles, shoulder, or scapula.  No decreased active or passive ROM b/l.  No TTP of spine or paraspinal muscles. Lymph nodes: Cervical, supraclavicular, and axillary nodes normal. Neurologic: Alert and oriented X 3, normal strength and tone. Normal symmetric reflexes. Normal coordination and gait    Assessment:    Healthy male exam with several MSK complaints.     Plan:     Anticipatory guidance given including wearing seatbelts, smoke detectors in the home, increasing physical activity, increasing p.o. intake of water and vegetables. -will obtain labs -given handout -immunizations up to date See After Visit Summary for Counseling Recommendations    Acquired clavicle deformity  -Discussed possible causes including arthritis, previous fracture or dislocation -We will obtain x-ray - Plan: DG Shoulder Left, Ambulatory referral to Orthopedic Surgery  Chronic bilateral low back pain with bilateral sciatica -Discussed supportive care including stretching, NSAIDs, heat, massage. -Patient to try OTC Biofreeze or other topical medication - Plan: DG Lumbar Spine Complete, Ambulatory referral to Orthopedic Surgery  Mixed hyperlipidemia  - Plan: Lipid panel  Class 3 severe obesity due to excess calories without serious comorbidity with body mass index (BMI) of 40.0 to 44.9 in adult [facility])  -discussed lifestyle modifications - Plan: Hemoglobin A1c  Nodule of finger of left hand  -f/u with Ortho  Follow-up PRN More than 50% of over 30 minutes spent face-to-face  with the patient, counseling and/or coordinating care.   Grier Mitts, MD

## 2018-08-17 ENCOUNTER — Encounter: Payer: Self-pay | Admitting: Family Medicine

## 2018-08-21 ENCOUNTER — Telehealth: Payer: Self-pay | Admitting: Family Medicine

## 2018-08-21 NOTE — Telephone Encounter (Signed)
Called patient notified him of lab results and what needs to do. Patient verbalized understanding.

## 2018-08-21 NOTE — Telephone Encounter (Signed)
Pt called back and stated that he does not have access to his MyChart and would like for you to give him a call back please.

## 2018-09-11 ENCOUNTER — Encounter: Payer: Self-pay | Admitting: Family Medicine

## 2018-09-11 ENCOUNTER — Ambulatory Visit (INDEPENDENT_AMBULATORY_CARE_PROVIDER_SITE_OTHER): Payer: 59 | Admitting: Family Medicine

## 2018-09-11 DIAGNOSIS — M25512 Pain in left shoulder: Secondary | ICD-10-CM | POA: Diagnosis not present

## 2018-09-11 DIAGNOSIS — G8929 Other chronic pain: Secondary | ICD-10-CM

## 2018-09-11 DIAGNOSIS — M5441 Lumbago with sciatica, right side: Secondary | ICD-10-CM

## 2018-09-11 MED ORDER — NAPROXEN 500 MG PO TABS: 500.0000 mg | ORAL_TABLET | Freq: Two times a day (BID) | ORAL | 6 refills | Status: DC | PRN

## 2018-09-11 MED ORDER — TIZANIDINE HCL 2 MG PO TABS: 2.0000 mg | ORAL_TABLET | Freq: Every evening | ORAL | 1 refills | Status: DC | PRN

## 2018-09-11 NOTE — Progress Notes (Signed)
I saw and examined the patient with Dr. Mayer Masker and agree with assessment and plan as outlined.    Left AC joint DJD with possible ganglion cyst presenting as asymptomatic nodule.  Reassurance given.  Chronic LBP with right radicular pain, non-focal exam.  Some urinary leakage, but sounds prostate-related.  Will try PT, naproxen, zanaflex.  Lumbar MRI if not improving.

## 2018-09-11 NOTE — Progress Notes (Signed)
Roger Mendoza. - 58 y.o. male MRN [id number]  Date of birth: [birth year]  Office Visit Note: Visit Date: 09/11/2018 PCP: [name], MD Referred by: [name], MD  Subjective: Chief Complaint  Patient presents with  . Lower Back - Pain    Chronic low back pain with pain going down the lateral thigh to the knee. Occasional numbness/tingling in both feet.  . Left Shoulder - Pain    Pain in left shoulder, with a "bump" on the top of the shoulder x 1 year. NKI   HPI: Roger Mendoza. is a 58 y.o. male who comes in today with chronic lower back pain with sciatica.  Reports back pain for the past 15-20 years. He has had no acute injury to back that preceded this but played football and baseball in high school. Back pain has been worsening recently and he has now had tingling down the back of his leg and into his foot. No apparent weakness or numbness in leg. Reports incomplete bladder emptying with occasional incontinence in the morning, but no numbness in the area. No bowel dysfunction. Has not taken any medication recently for pain. No PT.   Bump on shoulder- noticed it for the past year. No known injury or inciting event. Does not cause pain. Able to move shoulders fully with no issues.   ROS Otherwise per HPI.  Assessment & Plan: Visit Diagnoses:  1. Chronic bilateral low back pain with right-sided sciatica   2. Chronic left shoulder pain    Lumbar x-rays obtained showed narrowing of facet joint space likely compressing nerve and causing radicular pain. Suspect that bladder dysfunction is secondary to prostate with incomplete emptying and overflow incontinence vs neuropathy.   Right shoulder nodule appears to be bone spur on x-ray with likely ganglion cyst overlying given soft tissue mobile component on exam. Given no current symptoms, will continue to observe. Left finger nodule also appears to be ganglion cyst and is also likely from arthritic joint.    Plan:  - will start with naproxen and PT. If pain persists or if worsens, will obtain MRI  Meds & Orders:  Meds ordered this encounter  Medications  . naproxen (NAPROSYN) 500 MG tablet    Sig: Take 1 tablet (500 mg total) by mouth 2 (two) times daily as needed.    Dispense:  60 tablet    Refill:  6  . tiZANidine (ZANAFLEX) 2 MG tablet    Sig: Take 1-2 tablets (2-4 mg total) by mouth at bedtime as needed for muscle spasms.    Dispense:  60 tablet    Refill:  1   No orders of the defined types were placed in this encounter.   Follow-up: No follow-ups on file.   Procedures: No procedures performed  No notes on file   Clinical History: No specialty comments available.   He reports that he quit smoking about 4 years ago. His smoking use included cigarettes. He has a 3.75 pack-year smoking history. He has never used smokeless tobacco.  Recent Labs    08/15/18 1118  HGBA1C 4.9    Objective:  VS:  HT:    WT:   BMI:     BP:   HR: bpm  TEMP: ( )  RESP:  Physical Exam  PHYSICAL EXAM: Gen: NAD, alert, cooperative with exam, well-appearing HEENT: clear conjunctiva,  CV:  no edema, capillary refill brisk, normal rate Resp: non-labored Skin: no rashes, normal turgor  Neuro:  no gross deficits.  Psych:  alert and oriented  Ortho Exam  Lumbar spine: - Inspection: no gross deformity or asymmetry, swelling or ecchymosis - Palpation: TTP over S1 spinous process and right SI joint - ROM: full active ROM of the lumbar spine in flexion and extension. Pain in lower back with extension, lateral bend. - Strength: 5/5 strength of lower extremity in L4-S1 nerve root distributions b/l; normal gait - Neuro: sensation intact in the L4-S1 nerve root distribution b/l, diminished reflexes in L4 and S1 bilaterally  - Special testing: Negative straight leg raise, negative slump, positive right sided Stork test, Negative FABER (although difficult to do given tightness of left leg)  Left  Shoulder: Inspection: nodule over right AC joint, with mobile soft tissue component  Left 5th finger- nodule over 5th finger PIP. Able to fully flex and extend finger with no issues, normal alignment of finger  Imaging: No results found.  Past Medical/Family/Surgical/Social History: Medications & Allergies reviewed per EMR, new medications updated. Patient Active Problem List   Diagnosis Date Noted  . Daytime somnolence 07/20/2016  . Former smoker 05/11/2016  . Other fatigue 05/11/2016  . Hot flashes 05/11/2016  . Morbid obesity ([place]) 05/11/2016  . Skin lesion 05/11/2016  . External hemorrhoid 05/11/2016  . Erectile dysfunction 05/11/2016  . Encounter for hepatitis C screening test for low risk patient 05/11/2016  . Dyslipidemia 03/11/2016  . Routine general medical examination at a health care facility 11/02/2010  . OSA (obstructive sleep apnea) 03/11/2010   Past Medical History:  Diagnosis Date  . Allergic rhinitis, seasonal   . Allergy    seasonal  . Colon polyp 02/2012   tubular adenoma, Dr. Zenovia Jarred  . De Quervain's tenosynovitis, right    hx/o  . Epididymitis    hx/o as adult  . Erectile dysfunction   . Family history of premature CAD    father  . Hemorrhoids    hx of  . Hyperlipidemia   . Left inguinal hernia    tiny as of 03/2014  . MRSA (methicillin resistant staph aureus) culture positive    history of MRSA skin on pt's back/ unsure of date  . Normal cardiac stress test    remote past per patient, [place], [place]  . Obesity   . OSA on CPAP   . Recurrent UTI    Dr. Janice Norrie, [facility]  . Sleep apnea    uses c-pap  . Tinea pedis   . Tobacco use    4 cigarrettes daily  . Wears glasses    Family History  Problem Relation Age of Onset  . Hypertension Mother   . Arthritis Mother   . Hip dysplasia Mother   . Heart disease Mother   . Heart disease Father 20       died of MI  . Hyperlipidemia Father   . HIV Brother   . Colon cancer Neg Hx   .  Esophageal cancer Neg Hx   . Rectal cancer Neg Hx   . Stomach cancer Neg Hx   . Cancer Neg Hx   . Diabetes Neg Hx   . Stroke Neg Hx    Past Surgical History:  Procedure Laterality Date  . ADENOIDECTOMY    . COLONOSCOPY  02/2012   Dr. Zenovia Jarred, repeat 02/2017  . ESOPHAGEAL DILATION    . ROTATOR CUFF REPAIR  1997   left side  . SKIN SURGERY     large area of MRSA on back/ over 10  years ago  . TONSILLECTOMY    . UPPER GASTROINTESTINAL ENDOSCOPY     Social History   Occupational History  . Occupation: loads truck  Tobacco Use  . Smoking status: Former Smoker    Packs/day: 0.25    Years: 15.00    Pack years: 3.75    Types: Cigarettes    Quit date: 03/31/2014    Years since quitting: 4.4  . Smokeless tobacco: Never Used  Substance and Sexual Activity  . Alcohol use: Yes    Alcohol/week: 15.0 standard drinks    Types: 3 Glasses of wine, 12 Cans of beer per week    Comment: couple glasses of wine per day  . Drug use: No  . Sexual activity: Not on file

## 2018-10-03 ENCOUNTER — Other Ambulatory Visit: Payer: Self-pay | Admitting: Family Medicine

## 2018-12-03 ENCOUNTER — Other Ambulatory Visit: Payer: Self-pay | Admitting: Family Medicine

## 2018-12-31 ENCOUNTER — Telehealth (INDEPENDENT_AMBULATORY_CARE_PROVIDER_SITE_OTHER): Payer: 59 | Admitting: Family Medicine

## 2018-12-31 ENCOUNTER — Other Ambulatory Visit (INDEPENDENT_AMBULATORY_CARE_PROVIDER_SITE_OTHER): Payer: 59

## 2018-12-31 ENCOUNTER — Other Ambulatory Visit: Payer: Self-pay

## 2018-12-31 DIAGNOSIS — R109 Unspecified abdominal pain: Secondary | ICD-10-CM

## 2018-12-31 LAB — POCT URINALYSIS DIPSTICK
Glucose, UA: NEGATIVE
Leukocytes, UA: NEGATIVE
Protein, UA: NEGATIVE
Spec Grav, UA: 1.015 (ref 1.010–1.025)
Urobilinogen, UA: 0.2 E.U./dL
pH, UA: 6 (ref 5.0–8.0)

## 2018-12-31 NOTE — Progress Notes (Signed)
Virtual Visit via Video Note  I connected with Clarise Cruz on 12/31/18 at 10:30 AM EST by a video enabled telemedicine application 2/2 WGNFA-21 pandemic and verified that I am speaking with the correct person using two identifiers.  Location patient: home Location provider:work or home office Persons participating in the virtual visit: patient, provider  I discussed the limitations of evaluation and management by telemedicine and the availability of in person appointments. The patient expressed understanding and agreed to proceed.   HPI: Pt is a 58 yo male with pmh sig for hot flashes, OSA, h/o recurrent UTIs, h/o tobacco use, ED, HLD, seasonal allergies.  Pt seen for continued R sided low back pain.  Pt initially thought pain was his low back, but after completing PT and seeing Ortho states the pain is different.  Pt states the pain is above his R hip and below his rib cage.  Pt also notes a tingling sensation with urination x 1 month.  Unsure if pain moves.  Endorses hot flashes.  Denies dysuria, suprapubic pain, n/v, fever, urinary frequency.  Pt has been eating fried chicken and pork chops.  ROS: See pertinent positives and negatives per HPI.  Past Medical History:  Diagnosis Date  . Allergic rhinitis, seasonal   . Allergy    seasonal  . Colon polyp 02/2012   tubular adenoma, Dr. Zenovia Jarred  . De Quervain's tenosynovitis, right    hx/o  . Epididymitis    hx/o as adult  . Erectile dysfunction   . Family history of premature CAD    father  . Hemorrhoids    hx of  . Hyperlipidemia   . Left inguinal hernia    tiny as of 03/2014  . MRSA (methicillin resistant staph aureus) culture positive    history of MRSA skin on pt's back/ unsure of date  . Normal cardiac stress test    remote past per patient, [place], [place]  . Obesity   . OSA on CPAP   . Recurrent UTI    Dr. Janice Norrie, [facility]  . Sleep apnea    uses c-pap  . Tinea pedis   . Tobacco use    4 cigarrettes daily   . Wears glasses     Past Surgical History:  Procedure Laterality Date  . ADENOIDECTOMY    . COLONOSCOPY  02/2012   Dr. Zenovia Jarred, repeat 02/2017  . ESOPHAGEAL DILATION    . ROTATOR CUFF REPAIR  1997   left side  . SKIN SURGERY     large area of MRSA on back/ over 10 years ago  . TONSILLECTOMY    . UPPER GASTROINTESTINAL ENDOSCOPY      Family History  Problem Relation Age of Onset  . Hypertension Mother   . Arthritis Mother   . Hip dysplasia Mother   . Heart disease Mother   . Heart disease Father 13       died of MI  . Hyperlipidemia Father   . HIV Brother   . Colon cancer Neg Hx   . Esophageal cancer Neg Hx   . Rectal cancer Neg Hx   . Stomach cancer Neg Hx   . Cancer Neg Hx   . Diabetes Neg Hx   . Stroke Neg Hx      Current Outpatient Medications:  .  aspirin ([employer] ASPIRIN EC) 81 MG EC tablet, Take 1 tablet (81 mg total) by mouth daily., Disp: 90 tablet, Rfl: 3 .  Avanafil (STENDRA) 200 MG TABS, Take  1 tablet by mouth daily as needed. (Patient not taking: Reported on 08/15/2018), Disp: 10 tablet, Rfl: 3 .  cetirizine (ZYRTEC) 10 MG tablet, TAKE 1 TABLET BY MOUTH EVERY DAY (Patient not taking: Reported on 08/15/2018), Disp: 90 tablet, Rfl: 3 .  cetirizine (ZYRTEC) 10 MG tablet, Take 1 tablet (10 mg total) by mouth daily. (Patient not taking: Reported on 08/15/2018), Disp: 30 tablet, Rfl: 5 .  fluticasone (FLONASE) 50 MCG/ACT nasal spray, Place 2 sprays into both nostrils daily. (Patient not taking: Reported on 08/15/2018), Disp: 16 g, Rfl: 11 .  hydrocortisone (PROCTOSOL HC) 2.5 % rectal cream, Place 1 application rectally 2 (two) times daily. (Patient not taking: Reported on 08/15/2018), Disp: 28.35 g, Rfl: 0 .  naproxen (NAPROSYN) 500 MG tablet, Take 1 tablet (500 mg total) by mouth 2 (two) times daily as needed., Disp: 60 tablet, Rfl: 6 .  NON FORMULARY, C PAP, Disp: , Rfl:  .  pravastatin (PRAVACHOL) 20 MG tablet, TAKE 1 TABLET BY MOUTH EVERY DAY, Disp: 90 tablet, Rfl:  1 .  sildenafil (REVATIO) 20 MG tablet, 1-5 tablets (20 mg to 100 mg) prior to sexual activity daily prn (Patient not taking: Reported on 08/15/2018), Disp: 50 tablet, Rfl: 2 .  tiZANidine (ZANAFLEX) 2 MG tablet, TAKE 1-2 TABLETS (2-4 MG TOTAL) BY MOUTH AT BEDTIME AS NEEDED FOR MUSCLE SPASMS., Disp: 60 tablet, Rfl: 1  Current Facility-Administered Medications:  .  0.9 %  sodium chloride infusion, 500 mL, Intravenous, Once, Gatha Mayer, MD .  0.9 %  sodium chloride infusion, 500 mL, Intravenous, Once, Pyrtle, Lajuan Lines, MD  EXAM:  VITALS per patient if applicable:  GENERAL: alert, oriented, appears well and in no acute distress  HEENT: atraumatic, conjunctiva clear, no obvious abnormalities on inspection of external nose and ears  NECK: normal movements of the head and neck  LUNGS: on inspection no signs of respiratory distress, breathing rate appears normal, no obvious gross SOB, gasping or wheezing  CV: no obvious cyanosis  MS: moves all visible extremities without noticeable abnormality  PSYCH/NEURO: pleasant and cooperative, no obvious depression or anxiety, speech and thought processing grossly intact  ASSESSMENT AND PLAN:  Discussed the following assessment and plan:  Right flank pain  -discussed possible causes including UTI, recurrent prostatitis, renal calculi, BPH, pyelonephritis -pt to increase po intake of fluids -pt to provider urine specimen today -f/u with Urology for continued urinary symptoms - Plan: POC Urinalysis Dipstick, C. trachomatis/N. gonorrhoeae RNA, CT RENAL STONE STUDY, Culture, Urine -given precautions  F/u prn   I discussed the assessment and treatment plan with the patient. The patient was provided an opportunity to ask questions and all were answered. The patient agreed with the plan and demonstrated an understanding of the instructions.   The patient was advised to call back or seek an in-person evaluation if the symptoms worsen or if the  condition fails to improve as anticipated.  [name], MD

## 2019-01-01 LAB — CBC
HCT: 43.6 % (ref 39.0–52.0)
Hemoglobin: 14.3 g/dL (ref 13.0–17.0)
MCHC: 32.8 g/dL (ref 30.0–36.0)
MCV: 92.6 fl (ref 78.0–100.0)
Platelets: 264 10*3/uL (ref 150.0–400.0)
RBC: 4.71 Mil/uL (ref 4.22–5.81)
RDW: 13.3 % (ref 11.5–15.5)
WBC: 11.4 10*3/uL — ABNORMAL HIGH (ref 4.0–10.5)

## 2019-01-01 LAB — URINE CULTURE
MICRO NUMBER:: 1170746
Result:: NO GROWTH
SPECIMEN QUALITY:: ADEQUATE

## 2019-01-01 LAB — COMPREHENSIVE METABOLIC PANEL
ALT: 41 U/L (ref 0–53)
AST: 29 U/L (ref 0–37)
Albumin: 5.1 g/dL (ref 3.5–5.2)
Alkaline Phosphatase: 85 U/L (ref 39–117)
BUN: 14 mg/dL (ref 6–23)
CO2: 24 mEq/L (ref 19–32)
Calcium: 9.8 mg/dL (ref 8.4–10.5)
Chloride: 101 mEq/L (ref 96–112)
Creatinine, Ser: 1.16 mg/dL (ref 0.40–1.50)
GFR: 77.94 mL/min (ref >60.00)
Glucose, Bld: 87 mg/dL (ref 70–99)
Potassium: 3.8 mEq/L (ref 3.5–5.1)
Sodium: 138 mEq/L (ref 135–145)
Total Bilirubin: 0.5 mg/dL (ref 0.2–1.2)
Total Protein: 7.9 g/dL (ref 6.0–8.3)

## 2019-01-01 LAB — C. TRACHOMATIS/N. GONORRHOEAE RNA
C. trachomatis RNA, TMA: NOT DETECTED
N. gonorrhoeae RNA, TMA: NOT DETECTED

## 2019-01-08 ENCOUNTER — Ambulatory Visit
Admission: RE | Admit: 2019-01-08 | Discharge: 2019-01-08 | Disposition: A | Discharge status: Home / Self Care | Payer: 59 | Source: Ambulatory Visit | Attending: Family Medicine | Admitting: Family Medicine

## 2019-01-08 ENCOUNTER — Other Ambulatory Visit: Payer: Self-pay

## 2019-01-08 DIAGNOSIS — R109 Unspecified abdominal pain: Secondary | ICD-10-CM

## 2019-01-09 ENCOUNTER — Telehealth: Payer: Self-pay | Admitting: Family Medicine

## 2019-01-09 ENCOUNTER — Telehealth: Payer: Self-pay | Admitting: *Deleted

## 2019-01-09 DIAGNOSIS — R911 Solitary pulmonary nodule: Secondary | ICD-10-CM

## 2019-01-09 NOTE — Telephone Encounter (Signed)
Patient called 01/09/2019 at 4:45 PM with stone study results.  Imaging revealed lung nodule in the right lower lobe partially cystic/solid 2.8 x 2.2 cm.  Also discussed hepatic steatosis.  Patient advised to decrease intake of fatty/fried food.  No renal calculi noted.  Patient notes mild improvement in right flank pain. Will place order for CT chest with contrast.  Questions answered to satisfaction.  Grier Mitts, MD

## 2019-01-09 NOTE — Telephone Encounter (Signed)
Opal Sidles from [facility] called to give report to PCP on CT renal study that was done on 01/08/2019. There is a Nodular appearing opacity in right lower lobe. Recommends a a complete chest CT with IV contrast.

## 2019-01-11 NOTE — Telephone Encounter (Signed)
Provider already aware.  Pt was contacted a few days ago and imaging ordered.

## 2019-01-21 ENCOUNTER — Ambulatory Visit
Admission: RE | Admit: 2019-01-21 | Discharge: 2019-01-21 | Disposition: A | Discharge status: Home / Self Care | Payer: 59 | Source: Ambulatory Visit | Attending: Family Medicine | Admitting: Family Medicine

## 2019-01-21 ENCOUNTER — Telehealth: Payer: Self-pay | Admitting: Family Medicine

## 2019-01-21 ENCOUNTER — Telehealth: Payer: Self-pay

## 2019-01-21 DIAGNOSIS — R911 Solitary pulmonary nodule: Secondary | ICD-10-CM

## 2019-01-21 MED ORDER — IOPAMIDOL (ISOVUE-300) INJECTION 61%
75.0000 mL | Freq: Once | INTRAVENOUS | Status: AC | PRN
  Administered 2019-01-21: 75 mL via INTRAVENOUS

## 2019-01-21 NOTE — Telephone Encounter (Signed)
Pt called on mobile number.  Call in regards to CT chest with contrast which was ordered after CT renal stone study on 12/15 showed a solid/cystic appearing nodule in RL lung.  Pt advised official read of CT chest pending, but based on report called in to office, mixed cystic/solid lesion still present and requires additional evaluation.  Pt inquired about possible cause of lesion.  Advised concerning for cancer.  Pt advised referrals for additional studies and f/u with specialist to be placed.  Fatty liver also noted.  Pt expresses understanding.  Pt asks to be contacted by this provider tomorrow with any additional info.   Grier Mitts, MD 5:57 pm  01/21/2019

## 2019-01-21 NOTE — Telephone Encounter (Signed)
Dianne for [facility] radiology with call report. CT chest:  2.4 by 2.0 cm mixed cystic and solid lesion in the right lower lobe with eccentric irregular solid component measure 1.7 cm. Concerning for Lung cancer. recommend PET Ct  For multidiscipinary  thoracic oncology evaluation Fatty infiltration liver and scattered coronary artery and aortic calcification.  Fax number provided and results have not crossed over to chart. Results read back and was correct. Called CMA for Dr Volanda Napoleon and informed CMA is not available to take report. Will forward to office high priority.

## 2019-01-21 NOTE — Telephone Encounter (Signed)
CT chest:  2.4 by 2.0 cm mixed cystic and solid lesion in the right lower lobe with eccentric irregular solid component measure 1.7 cm. Concerning for Lung cancer. recommend PET Ct  For multidiscipinary  thoracic oncology evaluation Fatty infiltration liver and scattered coronary artery and aortic calcification.  Fax number provided and results have not crossed over to chart. Results read back and was correct. Called CMA for Dr Volanda Napoleon and informed CMA is not available to take report. Will forward to office high priority. This encounter was created in error - please disregard.

## 2019-01-21 NOTE — Telephone Encounter (Signed)
Please advise 

## 2019-01-24 ENCOUNTER — Telehealth: Payer: Self-pay | Admitting: *Deleted

## 2019-01-24 NOTE — Telephone Encounter (Signed)
Pt calling to check status. Please advise  °

## 2019-01-24 NOTE — Telephone Encounter (Signed)
Message Routed to PCP CMA 

## 2019-01-24 NOTE — Telephone Encounter (Signed)
Copied from [place] [phone]. Topic: General - Call Back - No Documentation >> Jan 23, 2019  6:09 PM Erick Blinks wrote: Reason for CRM: Pt wants call back to discuss CT results, please advise

## 2019-01-28 ENCOUNTER — Telehealth: Payer: Self-pay | Admitting: Pulmonary Disease

## 2019-01-28 NOTE — Telephone Encounter (Signed)
Pt returned call. PFT has been scheduled for 02/01/2019 at 1100 and OV with BI has been scheduled for 02/06/2019. Pt aware he will need to have COVID test prior to PFT.    PCC's can this COVID test be scheduled asap. Thanks.

## 2019-01-28 NOTE — Telephone Encounter (Signed)
Attempted to return pt call no option to leave a voice message

## 2019-01-28 NOTE — Telephone Encounter (Signed)
Matter addressed 01/21/19.

## 2019-01-28 NOTE — Telephone Encounter (Signed)
Spoke to pt and he is to go to [facility] to get covid test this afternoon.  Nothing further needed.

## 2019-01-28 NOTE — Telephone Encounter (Signed)
Garner Nash, DO  Amado Coe, RN; Collier Salina, RN  Hello,   Please schedule with me Jan 13th to discuss PET results and possible bronch can be in bronch/lung mass slot. Could we try our best to get a PFT worked in either at the hospital or office before the office visit? We need this before discussing surgical options   Thanks   Leory Plowman      [facility] for pt. Hospital is not doing any PFTs at this time. There are a few openings in our PFT rooms between now and 1/13, will need to schedule in enough time to allow for COVID testing.

## 2019-01-30 ENCOUNTER — Inpatient Hospital Stay: Admission: RE | Admit: 2019-01-30 | Payer: 59 | Source: Ambulatory Visit

## 2019-01-31 ENCOUNTER — Other Ambulatory Visit
Admission: RE | Admit: 2019-01-31 | Discharge: 2019-01-31 | Disposition: A | Discharge status: Home / Self Care | Payer: 59 | Source: Ambulatory Visit | Attending: Pulmonary Disease | Admitting: Pulmonary Disease

## 2019-01-31 ENCOUNTER — Other Ambulatory Visit: Payer: Self-pay

## 2019-01-31 ENCOUNTER — Ambulatory Visit (HOSPITAL_COMMUNITY): Payer: 59

## 2019-01-31 ENCOUNTER — Other Ambulatory Visit: Payer: Self-pay | Admitting: *Deleted

## 2019-01-31 DIAGNOSIS — Z01812 Encounter for preprocedural laboratory examination: Secondary | ICD-10-CM | POA: Diagnosis present

## 2019-01-31 DIAGNOSIS — Z20822 Contact with and (suspected) exposure to covid-19: Secondary | ICD-10-CM | POA: Insufficient documentation

## 2019-01-31 DIAGNOSIS — R911 Solitary pulmonary nodule: Secondary | ICD-10-CM

## 2019-02-01 LAB — SARS CORONAVIRUS 2 (TAT 6-24 HRS): SARS Coronavirus 2: NEGATIVE

## 2019-02-04 ENCOUNTER — Other Ambulatory Visit: Payer: Self-pay

## 2019-02-04 ENCOUNTER — Ambulatory Visit (INDEPENDENT_AMBULATORY_CARE_PROVIDER_SITE_OTHER): Payer: 59 | Admitting: Pulmonary Disease

## 2019-02-04 DIAGNOSIS — R911 Solitary pulmonary nodule: Secondary | ICD-10-CM | POA: Diagnosis not present

## 2019-02-04 LAB — PULMONARY FUNCTION TEST
DL/VA % pred: 132 %
DL/VA: 5.59 ml/min/mmHg/L
DLCO unc % pred: 123 %
DLCO unc: 36.56 ml/min/mmHg
FEF 25-75 Post: 4.26 L/sec
FEF 25-75 Pre: 2.68 L/sec
FEF2575-%Change-Post: 59 %
FEF2575-%Pred-Post: 135 %
FEF2575-%Pred-Pre: 85 %
FEV1-%Change-Post: 23 %
FEV1-%Pred-Post: 102 %
FEV1-%Pred-Pre: 83 %
FEV1-Post: 3.48 L
FEV1-Pre: 2.82 L
FEV1FVC-%Change-Post: 3 %
FEV1FVC-%Pred-Pre: 98 %
FEV6-%Change-Post: 19 %
FEV6-%Pred-Post: 103 %
FEV6-%Pred-Pre: 86 %
FEV6-Post: 4.33 L
FEV6-Pre: 3.63 L
FEV6FVC-%Change-Post: 0 %
FEV6FVC-%Pred-Post: 103 %
FEV6FVC-%Pred-Pre: 103 %
FVC-%Change-Post: 19 %
FVC-%Pred-Post: 99 %
FVC-%Pred-Pre: 83 %
FVC-Post: 4.33 L
FVC-Pre: 3.64 L
Post FEV1/FVC ratio: 80 %
Post FEV6/FVC ratio: 100 %
Pre FEV1/FVC ratio: 77 %
Pre FEV6/FVC Ratio: 100 %
RV % pred: 155 %
RV: 3.63 L
TLC % pred: 112 %
TLC: 8.33 L

## 2019-02-04 NOTE — Progress Notes (Signed)
Full PFT performed today. °

## 2019-02-04 NOTE — Telephone Encounter (Signed)
Imaging results given to pt by Dr Volanda Napoleon

## 2019-02-06 ENCOUNTER — Ambulatory Visit (INDEPENDENT_AMBULATORY_CARE_PROVIDER_SITE_OTHER): Payer: 59 | Admitting: Pulmonary Disease

## 2019-02-06 ENCOUNTER — Encounter: Payer: Self-pay | Admitting: Pulmonary Disease

## 2019-02-06 ENCOUNTER — Other Ambulatory Visit: Payer: Self-pay

## 2019-02-06 VITALS — BP 120/82 | HR 78 | Temp 98.2°F | Ht 73.0 in | Wt 306.0 lb

## 2019-02-06 DIAGNOSIS — R9389 Abnormal findings on diagnostic imaging of other specified body structures: Secondary | ICD-10-CM | POA: Insufficient documentation

## 2019-02-06 DIAGNOSIS — Z87891 Personal history of nicotine dependence: Secondary | ICD-10-CM | POA: Diagnosis not present

## 2019-02-06 DIAGNOSIS — R911 Solitary pulmonary nodule: Secondary | ICD-10-CM | POA: Diagnosis not present

## 2019-02-06 NOTE — Patient Instructions (Signed)
Thank you for visiting Dr. Valeta Harms at [facility]. Today we recommend the following:  Orders Placed This Encounter  Procedures  . NM PET - Initial Skull Base To Thigh  . Ambulatory referral to Cardiothoracic Surgery   Please schedule PET scan ASAP, should have appointment with Dr. Kipp Brood scheduled after PET is complete.   Return in about 8 weeks (around 04/03/2019).    Please do your part to reduce the spread of COVID-19.

## 2019-02-06 NOTE — Progress Notes (Signed)
Synopsis: Referred in January 2021 for lung nodule by [name], MD  Subjective:   PATIENT ID: Roger Mendoza. GENDER: male DOB: [birth year], MRN: [id number]  Chief Complaint  Patient presents with  . Pulmonary Consult    Referred to discuss Bronchoscopy. Patient has a CT scan on 01/21/19. He denies sob or cough.     59 year old gentleman, OSA on CPAP, tobacco abuse history, underwent CT scan imaging in December 2020 which revealed a 2.4 x 2 cm cystic/solid appearing mass suspicious for lung cancer.  Patient was discussed with oncology coordinator at time of abnormal CT imaging.  Patient was scheduled to have a nuclear medicine PET scan prior to seeing me today in the office however this has not yet to be completed.  Today in the office we reviewed the patient's CT imaging. No FMHx of lung cancer. Former smoker, quit 4 years ago. Smoked since teenager, >40 years. Was able to quit.  From respiratory standpoint patient is stable has no complaints today.  Slightly anxious about recent image findings.  We discussed these today in detail.  Patient is on CPAP therapy for his OSA.  He is followed by his workplace.  Patient works for [employer].       Past Medical History:  Diagnosis Date  . Allergic rhinitis, seasonal   . Allergy    seasonal  . Colon polyp 02/2012   tubular adenoma, Dr. Zenovia Jarred  . De Quervain's tenosynovitis, right    hx/o  . Epididymitis    hx/o as adult  . Erectile dysfunction   . Family history of premature CAD    father  . Hemorrhoids    hx of  . Hyperlipidemia   . Left inguinal hernia    tiny as of 03/2014  . MRSA (methicillin resistant staph aureus) culture positive    history of MRSA skin on pt's back/ unsure of date  . Normal cardiac stress test    remote past per patient, [place], [place]  . Obesity   . OSA on CPAP   . Recurrent UTI    Dr. Janice Norrie, [facility]  . Sleep apnea    uses c-pap  . Tinea pedis   . Tobacco use    4  cigarrettes daily  . Wears glasses      Family History  Problem Relation Age of Onset  . Hypertension Mother   . Arthritis Mother   . Hip dysplasia Mother   . Heart disease Mother   . Heart disease Father 16       died of MI  . Hyperlipidemia Father   . HIV Brother   . Colon cancer Neg Hx   . Esophageal cancer Neg Hx   . Rectal cancer Neg Hx   . Stomach cancer Neg Hx   . Cancer Neg Hx   . Diabetes Neg Hx   . Stroke Neg Hx      Past Surgical History:  Procedure Laterality Date  . ADENOIDECTOMY    . COLONOSCOPY  02/2012   Dr. Zenovia Jarred, repeat 02/2017  . ESOPHAGEAL DILATION    . ROTATOR CUFF REPAIR  1997   left side  . SKIN SURGERY     large area of MRSA on back/ over 10 years ago  . TONSILLECTOMY    . UPPER GASTROINTESTINAL ENDOSCOPY      Social History   Socioeconomic History  . Marital status: Married    Spouse name: Not on file  . Number  of children: Not on file  . Years of education: Not on file  . Highest education level: Not on file  Occupational History  . Occupation: loads truck  Tobacco Use  . Smoking status: Former Smoker    Packs/day: 0.25    Years: 15.00    Pack years: 3.75    Types: Cigarettes    Quit date: 03/31/2014    Years since quitting: 4.8  . Smokeless tobacco: Never Used  Substance and Sexual Activity  . Alcohol use: Yes    Alcohol/week: 15.0 standard drinks    Types: 3 Glasses of wine, 12 Cans of beer per week    Comment: couple glasses of wine per day  . Drug use: No  . Sexual activity: Not on file  Other Topics Concern  . Not on file  Social History Narrative   Married, has 59yo son, works for [employer] at [employer], loading trucks, exercise - walks a lot at work, diet - has cut back on sweets, bread and greasy foods.  Plays golf, used to play softball.  Attends church, deacon.  04/2016   Social Determinants of Health   Financial Resource Strain:   . Difficulty of Paying Living Expenses: Not on file  Food Insecurity:   .  Worried About Charity fundraiser in the Last Year: Not on file  . Ran Out of Food in the Last Year: Not on file  Transportation Needs:   . Lack of Transportation (Medical): Not on file  . Lack of Transportation (Non-Medical): Not on file  Physical Activity:   . Days of Exercise per Week: Not on file  . Minutes of Exercise per Session: Not on file  Stress:   . Feeling of Stress : Not on file  Social Connections:   . Frequency of Communication with Friends and Family: Not on file  . Frequency of Social Gatherings with Friends and Family: Not on file  . Attends Religious Services: Not on file  . Active Member of Clubs or Organizations: Not on file  . Attends Archivist Meetings: Not on file  . Marital Status: Not on file  Intimate Partner Violence:   . Fear of Current or Ex-Partner: Not on file  . Emotionally Abused: Not on file  . Physically Abused: Not on file  . Sexually Abused: Not on file     No Known Allergies   Outpatient Medications Prior to Visit  Medication Sig Dispense Refill  . aspirin ([employer] ASPIRIN EC) 81 MG EC tablet Take 1 tablet (81 mg total) by mouth daily. 90 tablet 3  . Avanafil (STENDRA) 200 MG TABS Take 1 tablet by mouth daily as needed. 10 tablet 3  . cetirizine (ZYRTEC) 10 MG tablet TAKE 1 TABLET BY MOUTH EVERY DAY 90 tablet 3  . fluticasone (FLONASE) 50 MCG/ACT nasal spray Place 2 sprays into both nostrils daily. 16 g 11  . hydrocortisone (PROCTOSOL HC) 2.5 % rectal cream Place 1 application rectally 2 (two) times daily. 28.35 g 0  . naproxen (NAPROSYN) 500 MG tablet Take 1 tablet (500 mg total) by mouth 2 (two) times daily as needed. 60 tablet 6  . NON FORMULARY C PAP    . pravastatin (PRAVACHOL) 20 MG tablet TAKE 1 TABLET BY MOUTH EVERY DAY 90 tablet 1  . sildenafil (REVATIO) 20 MG tablet 1-5 tablets (20 mg to 100 mg) prior to sexual activity daily prn 50 tablet 2  . tiZANidine (ZANAFLEX) 2 MG tablet TAKE 1-2 TABLETS (2-4  MG TOTAL) BY MOUTH AT  BEDTIME AS NEEDED FOR MUSCLE SPASMS. 60 tablet 1  . cetirizine (ZYRTEC) 10 MG tablet Take 1 tablet (10 mg total) by mouth daily. (Patient not taking: Reported on 08/15/2018) 30 tablet 5   Facility-Administered Medications Prior to Visit  Medication Dose Route Frequency Provider Last Rate Last Admin  . 0.9 %  sodium chloride infusion  500 mL Intravenous Once Gatha Mayer, MD      . 0.9 %  sodium chloride infusion  500 mL Intravenous Once Pyrtle, Lajuan Lines, MD        Review of Systems  Constitutional: Negative for chills, fever, malaise/fatigue and weight loss.  HENT: Negative for hearing loss, sore throat and tinnitus.   Eyes: Negative for blurred vision and double vision.  Respiratory: Negative for cough, hemoptysis, sputum production, shortness of breath, wheezing and stridor.   Cardiovascular: Negative for chest pain, palpitations, orthopnea, leg swelling and PND.  Gastrointestinal: Negative for abdominal pain, constipation, diarrhea, heartburn, nausea and vomiting.  Genitourinary: Negative for dysuria, hematuria and urgency.  Musculoskeletal: Negative for joint pain and myalgias.  Skin: Negative for itching and rash.  Neurological: Negative for dizziness, tingling, weakness and headaches.  Endo/Heme/Allergies: Negative for environmental allergies. Does not bruise/bleed easily.  Psychiatric/Behavioral: Negative for depression. The patient is not nervous/anxious and does not have insomnia.   All other systems reviewed and are negative.    Objective:  Physical Exam Vitals reviewed.  Constitutional:      General: He is not in acute distress.    Appearance: He is well-developed. He is obese.  HENT:     Head: Normocephalic and atraumatic.  Eyes:     General: No scleral icterus.    Conjunctiva/sclera: Conjunctivae normal.     Pupils: Pupils are equal, round, and reactive to light.  Neck:     Vascular: No JVD.     Trachea: No tracheal deviation.  Cardiovascular:     Rate and  Rhythm: Normal rate and regular rhythm.     Heart sounds: Normal heart sounds. No murmur.  Pulmonary:     Effort: Pulmonary effort is normal. No tachypnea, accessory muscle usage or respiratory distress.     Breath sounds: Normal breath sounds. No stridor. No wheezing, rhonchi or rales.  Abdominal:     General: Bowel sounds are normal. There is no distension.     Palpations: Abdomen is soft.     Tenderness: There is no abdominal tenderness.     Comments: Obese abdomen  Musculoskeletal:        General: No tenderness.     Cervical back: Neck supple.     Comments: Trace bilateral lower extremity edema  Lymphadenopathy:     Cervical: No cervical adenopathy.  Skin:    General: Skin is warm and dry.     Capillary Refill: Capillary refill takes less than 2 seconds.     Findings: No rash.  Neurological:     Mental Status: He is alert and oriented to person, place, and time.  Psychiatric:        Behavior: Behavior normal.      Vitals:   02/06/19 1028  BP: 120/82  Pulse: 78  Temp: 98.2 F (36.8 C)  TempSrc: Temporal  SpO2: 96%  Weight: (!) 306 lb (138.8 kg)  Height: 6\' 1"  (1.854 m)   96% on RA BMI Readings from Last 3 Encounters:  02/06/19 40.37 kg/m  08/15/18 41.18 kg/m  02/12/18 40.69 kg/m   Wt Readings from  Last 3 Encounters:  02/06/19 (!) 306 lb (138.8 kg)  08/15/18 299 lb 6.4 oz (135.8 kg)  02/12/18 300 lb (136.1 kg)     CBC    Component Value Date/Time   WBC 11.4 (H) 12/31/2018 1554   RBC 4.71 12/31/2018 1554   HGB 14.3 12/31/2018 1554   HCT 43.6 12/31/2018 1554   PLT 264.0 12/31/2018 1554   MCV 92.6 12/31/2018 1554   MCH 29.8 05/11/2016 0806   MCHC 32.8 12/31/2018 1554   RDW 13.3 12/31/2018 1554   LYMPHSABS 2.4 08/15/2018 1118   MONOABS 0.6 08/15/2018 1118   EOSABS 0.3 08/15/2018 1118   BASOSABS 0.0 08/15/2018 1118     Chest Imaging: 01/21/2019 CT chest: 2.4 x 2 cm mixed cystic solid lesion of the right lower lobe concerning for lung  cancer. The patient's images have been independently reviewed by me.     Pulmonary Functions Testing Results: PFT Results Latest Ref Rng & Units 02/04/2019  FVC-Pre L 3.64  FVC-Predicted Pre % 83  FVC-Post L 4.33  FVC-Predicted Post % 99  Pre FEV1/FVC % % 77  Post FEV1/FCV % % 80  FEV1-Pre L 2.82  FEV1-Predicted Pre % 83  FEV1-Post L 3.48  DLCO UNC% % 123  DLCO COR %Predicted % 132  TLC L 8.33  TLC % Predicted % 112  RV % Predicted % 155    FeNO: None   Pathology: None   Echocardiogram: None    Heart Catheterization: None     Assessment & Plan:     ICD-10-CM   1. Lung nodule  R91.1 Ambulatory referral to Cardiothoracic Surgery  2. Solitary pulmonary nodule  R91.1 NM PET - Initial Skull Base To Thigh  3. Former smoker  Z87.891   4. Abnormal CT of the chest  R93.89     Discussion:  This is a 58 year old, former smoker quit 4 years ago, recent PFTs with normal spirometry however significant bronchodilator response and mildly elevated DLCO which I expect is related to bronchial reactivity in the setting of obesity and DLCO related to body habitus as well.  As for the patient's lung nodule does have a cystic component, likely a CALC or cyst associated lung carcinoma.  A PET scan has been ordered.  PFTs are also reassuring.  If the PET scan has increased uptake within the more solid component of this lesion I would advocate for the patient to undergo surgery for removal.  Plan: I will discuss case with Dr. Kipp Brood for consideration of wedge resection followed by lobectomy if frozen section is positive for malignancy due to the peripheral nature of the lesion. PET scan orders have been placed.  This needs to be done ASAP. Referral to cardiothoracic surgery placed.  Patient return to clinic and see [place] in approximately 8 weeks following to see how the patient is doing.  Greater than 50% of this patient's 62-minute visit was spent face-to-face discussing the above  recommendations and treatment plan.  Additional time spent reviewing images as well as pulmonary function test on the day of the patient's visit.   Current Outpatient Medications:  .  aspirin (CVS ASPIRIN EC) 81 MG EC tablet, Take 1 tablet (81 mg total) by mouth daily., Disp: 90 tablet, Rfl: 3 .  Avanafil (STENDRA) 200 MG TABS, Take 1 tablet by mouth daily as needed., Disp: 10 tablet, Rfl: 3 .  cetirizine (ZYRTEC) 10 MG tablet, TAKE 1 TABLET BY MOUTH EVERY DAY, Disp: 90 tablet, Rfl: 3 .  fluticasone (  FLONASE) 50 MCG/ACT nasal spray, Place 2 sprays into both nostrils daily., Disp: 16 g, Rfl: 11 .  hydrocortisone (PROCTOSOL HC) 2.5 % rectal cream, Place 1 application rectally 2 (two) times daily., Disp: 28.35 g, Rfl: 0 .  naproxen (NAPROSYN) 500 MG tablet, Take 1 tablet (500 mg total) by mouth 2 (two) times daily as needed., Disp: 60 tablet, Rfl: 6 .  NON FORMULARY, C PAP, Disp: , Rfl:  .  pravastatin (PRAVACHOL) 20 MG tablet, TAKE 1 TABLET BY MOUTH EVERY DAY, Disp: 90 tablet, Rfl: 1 .  sildenafil (REVATIO) 20 MG tablet, 1-5 tablets (20 mg to 100 mg) prior to sexual activity daily prn, Disp: 50 tablet, Rfl: 2 .  tiZANidine (ZANAFLEX) 2 MG tablet, TAKE 1-2 TABLETS (2-4 MG TOTAL) BY MOUTH AT BEDTIME AS NEEDED FOR MUSCLE SPASMS., Disp: 60 tablet, Rfl: 1  Current Facility-Administered Medications:  .  0.9 %  sodium chloride infusion, 500 mL, Intravenous, Once, Gatha Mayer, MD .  0.9 %  sodium chloride infusion, 500 mL, Intravenous, Once, Pyrtle, Lajuan Lines, MD   Garner Nash, DO [facility] 02/06/2019 10:57 AM

## 2019-02-11 ENCOUNTER — Other Ambulatory Visit: Payer: Self-pay | Admitting: Family Medicine

## 2019-02-14 ENCOUNTER — Ambulatory Visit (HOSPITAL_COMMUNITY): Payer: 59

## 2019-02-15 ENCOUNTER — Encounter: Payer: 59 | Admitting: Thoracic Surgery (Cardiothoracic Vascular Surgery)

## 2019-02-15 ENCOUNTER — Encounter (HOSPITAL_COMMUNITY)
Admission: RE | Admit: 2019-02-15 | Discharge: 2019-02-15 | Disposition: A | Discharge status: Home / Self Care | Payer: 59 | Source: Ambulatory Visit | Attending: Pulmonary Disease | Admitting: Pulmonary Disease

## 2019-02-15 ENCOUNTER — Other Ambulatory Visit: Payer: Self-pay

## 2019-02-15 DIAGNOSIS — R911 Solitary pulmonary nodule: Secondary | ICD-10-CM | POA: Diagnosis present

## 2019-02-15 LAB — GLUCOSE, CAPILLARY: Glucose-Capillary: 91 mg/dL (ref 70–99)

## 2019-02-15 MED ORDER — FLUDEOXYGLUCOSE F - 18 (FDG) INJECTION
15.4000 | Freq: Once | INTRAVENOUS | Status: AC
  Administered 2019-02-15: 15.4 via INTRAVENOUS

## 2019-02-19 ENCOUNTER — Encounter: Payer: 59 | Admitting: Thoracic Surgery (Cardiothoracic Vascular Surgery)

## 2019-02-22 ENCOUNTER — Other Ambulatory Visit: Payer: Self-pay | Admitting: Thoracic Surgery (Cardiothoracic Vascular Surgery)

## 2019-02-22 ENCOUNTER — Encounter: Payer: Self-pay | Admitting: Thoracic Surgery (Cardiothoracic Vascular Surgery)

## 2019-02-22 ENCOUNTER — Other Ambulatory Visit: Payer: Self-pay

## 2019-02-22 ENCOUNTER — Other Ambulatory Visit: Payer: Self-pay | Admitting: *Deleted

## 2019-02-22 ENCOUNTER — Institutional Professional Consult (permissible substitution): Payer: 59 | Admitting: Thoracic Surgery (Cardiothoracic Vascular Surgery)

## 2019-02-22 VITALS — BP 146/88 | HR 79 | Temp 97.7°F | Resp 20 | Ht 73.0 in | Wt 310.0 lb

## 2019-02-22 DIAGNOSIS — R911 Solitary pulmonary nodule: Secondary | ICD-10-CM

## 2019-02-22 DIAGNOSIS — G4733 Obstructive sleep apnea (adult) (pediatric): Secondary | ICD-10-CM

## 2019-02-22 NOTE — Progress Notes (Signed)
[address]       [place] [address]             [phone]                    [name] [place] Record #[id number] Date of Birth: [birth year]  Referring: Garner Nash, DO Primary Care: [name], MD Primary Cardiologist: No primary care provider on file.  Chief Complaint:   No chief complaint on file.   History of Present Illness:    Roger Mendoza. 59 y.o. male  referred for surgical evaluation of the right lower lobe 2 cm pulmonary nodule concerning for lung cancer.  He is a former smoker quit approximately 40 years ago and underwent cross-sectional imaging which discovered this nodule.  Respiratory standpoint, he only complains of minimal shortness of breath with exertion.  He denies any chest pain.  He states that he is out of shape and has gained weight over the last several months.  He denies any neurologic symptoms.    Smoking Hx: Quit smoking 4 yrs ago   Zubrod Score: At the time of surgery this patient's most appropriate activity status/level should be described as: [x]     0    Normal activity, no symptoms []     1    Restricted in physical strenuous activity but ambulatory, able to do out light work []     2    Ambulatory and capable of self care, unable to do work activities, up and about               >50 % of waking hours                              []     3    Only limited self care, in bed greater than 50% of waking hours []     4    Completely disabled, no self care, confined to bed or chair []     5    Moribund   Past Medical History:  Diagnosis Date  . Allergic rhinitis, seasonal   . Allergy    seasonal  . Colon polyp 02/2012   tubular adenoma, Dr. Zenovia Jarred  . De Quervain's tenosynovitis, right    hx/o  . Epididymitis    hx/o as adult  . Erectile dysfunction   . Family history of premature CAD    father  . Hemorrhoids    hx of  . Hyperlipidemia   . Left inguinal hernia    tiny  as of 03/2014  . MRSA (methicillin resistant staph aureus) culture positive    history of MRSA skin on pt's back/ unsure of date  . Normal cardiac stress test    remote past per patient, [place], [place]  . Obesity   . OSA on CPAP   . Recurrent UTI    Dr. Janice Norrie, [facility]  . Sleep apnea    uses c-pap  . Tinea pedis   . Tobacco use    4 cigarrettes daily  . Wears glasses     Past Surgical History:  Procedure Laterality Date  . ADENOIDECTOMY    . COLONOSCOPY  02/2012   Dr. Zenovia Jarred, repeat 02/2017  . ESOPHAGEAL DILATION    . ROTATOR CUFF REPAIR  1997   left side  . SKIN SURGERY     large  area of MRSA on back/ over 10 years ago  . TONSILLECTOMY    . UPPER GASTROINTESTINAL ENDOSCOPY      Family History  Problem Relation Age of Onset  . Hypertension Mother   . Arthritis Mother   . Hip dysplasia Mother   . Heart disease Mother   . Heart disease Father 52       died of MI  . Hyperlipidemia Father   . HIV Brother   . Colon cancer Neg Hx   . Esophageal cancer Neg Hx   . Rectal cancer Neg Hx   . Stomach cancer Neg Hx   . Cancer Neg Hx   . Diabetes Neg Hx   . Stroke Neg Hx      Social History   Tobacco Use  Smoking Status Former Smoker  . Packs/day: 0.25  . Years: 15.00  . Pack years: 3.75  . Types: Cigarettes  . Quit date: 03/31/2014  . Years since quitting: 4.9  Smokeless Tobacco Never Used    Social History   Substance and Sexual Activity  Alcohol Use Yes  . Alcohol/week: 15.0 standard drinks  . Types: 3 Glasses of wine, 12 Cans of beer per week   Comment: couple glasses of wine per day     No Known Allergies  Current Outpatient Medications  Medication Sig Dispense Refill  . ASPIRIN LOW DOSE 81 MG EC tablet TAKE 1 TABLET BY MOUTH EVERY DAY 90 tablet 3  . Avanafil (STENDRA) 200 MG TABS Take 1 tablet by mouth daily as needed. 10 tablet 3  . cetirizine (ZYRTEC) 10 MG tablet TAKE 1 TABLET BY MOUTH EVERY DAY 90 tablet 3  . fluticasone (FLONASE) 50  MCG/ACT nasal spray Place 2 sprays into both nostrils daily. 16 g 11  . hydrocortisone (PROCTOSOL HC) 2.5 % rectal cream Place 1 application rectally 2 (two) times daily. 28.35 g 0  . naproxen (NAPROSYN) 500 MG tablet Take 1 tablet (500 mg total) by mouth 2 (two) times daily as needed. 60 tablet 6  . NON FORMULARY C PAP    . pravastatin (PRAVACHOL) 20 MG tablet TAKE 1 TABLET BY MOUTH EVERY DAY 90 tablet 1  . sildenafil (REVATIO) 20 MG tablet 1-5 tablets (20 mg to 100 mg) prior to sexual activity daily prn 50 tablet 2  . tiZANidine (ZANAFLEX) 2 MG tablet TAKE 1-2 TABLETS (2-4 MG TOTAL) BY MOUTH AT BEDTIME AS NEEDED FOR MUSCLE SPASMS. 60 tablet 1   Current Facility-Administered Medications  Medication Dose Route Frequency Provider Last Rate Last Admin  . 0.9 %  sodium chloride infusion  500 mL Intravenous Once Gatha Mayer, MD      . 0.9 %  sodium chloride infusion  500 mL Intravenous Once Pyrtle, Lajuan Lines, MD        Review of Systems  Constitutional: Negative for fever, malaise/fatigue and weight loss.  HENT: Positive for nosebleeds.   Respiratory: Positive for shortness of breath. Negative for cough and sputum production.   Cardiovascular: Negative for chest pain and leg swelling.  Musculoskeletal: Negative.      PHYSICAL EXAMINATION: There were no vitals taken for this visit. Physical Exam  Constitutional: He is oriented to person, place, and time. He appears well-developed and well-nourished.  HENT:  Head: Normocephalic and atraumatic.  Eyes: EOM are normal. No scleral icterus.  Neck: No tracheal deviation present.  Cardiovascular: Normal rate.  Respiratory: Effort normal. No respiratory distress.  GI: Soft. He exhibits no distension.  Musculoskeletal:  General: Normal range of motion.     Cervical back: Normal range of motion.  Neurological: He is alert and oriented to person, place, and time.  Skin: Skin is warm and dry.    Diagnostic Studies & Laboratory data:       Recent Radiology Findings:   NM PET - Initial Skull Base To Thigh  Result Date: 02/15/2019 CLINICAL DATA:  Initial treatment strategy for cavitary right lower lobe lung lesion. EXAM: NUCLEAR MEDICINE PET SKULL BASE TO THIGH TECHNIQUE: 15.4 mCi F-18 FDG was injected intravenously. Full-ring PET imaging was performed from the skull base to thigh after the radiotracer. CT data was obtained and used for attenuation correction and anatomic localization. Fasting blood glucose: 91 mg/dl COMPARISON:  Chest CT 01/21/2019. Abdominal CT 01/08/2019 and 11/07/2013. FINDINGS: Mediastinal blood pool activity: SUV max 3.1 NECK: No hypermetabolic cervical lymph nodes are identified.There are no lesions of the pharyngeal mucosal space. Focal activity at the larynx is likely physiologic. Incidental CT findings: none CHEST: There are no hypermetabolic mediastinal, hilar or axillary lymph nodes. The cavitary right lower lobe lung lesion is unchanged from the recent chest CT, measuring 2.2 x 1.9 cm on image 55/8. This has a solid component posteriorly which measures 13 x 8 mm. This lesion demonstrates only low level metabolic activity with an SUV max 2.4. No new or enlarging pulmonary nodules. Incidental CT findings: Aortic and coronary artery atherosclerosis. ABDOMEN/PELVIS: There is no hypermetabolic activity within the liver, adrenal glands, spleen or pancreas. There is no hypermetabolic nodal activity. Incidental CT findings: Hepatic steatosis and aortic and branch vessel atherosclerosis are again noted. The urinary bladder is incompletely distended. SKELETON: There is no hypermetabolic activity to suggest osseous metastatic disease. Incidental CT findings: none IMPRESSION: 1. The cavitary right lower lobe lung lesion does not demonstrate significant hypermetabolic activity, although remains suspicious for bronchogenic carcinoma (likely adenocarcinoma) based on morphology. Tissue sampling should be considered. 2. No evidence of  metastatic disease. 3. Stable incidental findings including aortic atherosclerosis and hepatic steatosis. Electronically Signed   By: Richardean Sale M.D.   On: 02/15/2019 16:23       I have independently reviewed the above radiology studies  and reviewed the findings with the patient.   Recent Lab Findings: Lab Results  Component Value Date   WBC 11.4 (H) 12/31/2018   HGB 14.3 12/31/2018   HCT 43.6 12/31/2018   PLT 264.0 12/31/2018   GLUCOSE 87 12/31/2018   CHOL 180 08/15/2018   TRIG 134.0 08/15/2018   HDL 38.10 (L) 08/15/2018   LDLDIRECT 142 (H) 01/04/2012   LDLCALC 115 (H) 08/15/2018   ALT 41 12/31/2018   AST 29 12/31/2018   NA 138 12/31/2018   K 3.8 12/31/2018   CL 101 12/31/2018   CREATININE 1.16 12/31/2018   BUN 14 12/31/2018   CO2 24 12/31/2018   TSH 1.41 05/11/2016   HGBA1C 4.9 08/15/2018     PFTs: - FVC: 83% - FEV1: 83% -DLCO: 123%   Assessment / Plan:   59 year old male with a 2.4 cm right lower lobe pulmonary nodule with a cystic.  PET CT showed that this nodule had an SUV max of 2.4.  Given his smoking history, and characteristics of the mass concern for primary lung cancer.  I recommended that he undergo bronchoscopy, robot assisted thoracoscopy, right lower lobe wedge resection, possible right lower lobectomy.  Risks and benefits discussed in detail, and he is agreeable to proceed with this plan.  He will require stress test,  and is tentatively scheduled for February 28, 2019     I  spent 40 minutes with  the patient face to face and greater then 50% of the time was spent in counseling and coordination of care.    Lajuana Matte 02/22/2019 3:31 PM

## 2019-02-25 NOTE — Progress Notes (Signed)
[id number] [address], [place] [address] Phone: [phone] Fax: [phone]  [facility] [address] [place] [address] Phone: [phone] Fax: [phone]      Your procedure is scheduled on Thursday, February 4th, 2021.  Report to [facility] Main Entrance "A" at 5:45 A.M., and check in at the Admitting office.  Call this number if you have problems the morning of surgery:  [phone]  Call [phone] if you have any questions prior to your surgery date Monday-Friday 8am-4pm    Remember:  Do not eat or drink after midnight the night before your surgery   Take ONLY these medicines the morning of surgery with A SIP OF WATER :  Cetirizine (Zyrtec) Fluticasone (Flonase) nasal spray  7 days prior to surgery STOP taking any Aspirin (unless otherwise instructed by your surgeon), Aleve, Naproxen, Ibuprofen, Motrin, Advil, Goody's, BC's, all herbal medications, fish oil, and all vitamins.    The Morning of Surgery  Do not wear jewelry.  Do not wear lotions, powders, colognes, or deodorant  Do not shave 48 hours prior to surgery.  Men may shave face and neck.  Do not bring valuables to the hospital.  [facility] is not responsible for any belongings or valuables.  If you are a smoker, DO NOT Smoke 24 hours prior to surgery  If you wear a CPAP at night please bring your mask the morning of surgery   Remember that you must have someone to transport you home after your surgery, and remain with you for 24 hours if you are discharged the same day.   Please bring cases for contacts, glasses, hearing aids, dentures or bridgework because it cannot be worn into surgery.    Leave your suitcase in the car.  After surgery it may be brought to your room.  For patients admitted to the hospital, discharge time will be determined by your treatment  team.  Patients discharged the day of surgery will not be allowed to drive home.    Special instructions:   [place]- Preparing For Surgery  Before surgery, you can play an important role. Because skin is not sterile, your skin needs to be as free of germs as possible. You can reduce the number of germs on your skin by washing with CHG (chlorahexidine gluconate) Soap before surgery.  CHG is an antiseptic cleaner which kills germs and bonds with the skin to continue killing germs even after washing.    Oral Hygiene is also important to reduce your risk of infection.  Remember - BRUSH YOUR TEETH THE MORNING OF SURGERY WITH YOUR REGULAR TOOTHPASTE  Please do not use if you have an allergy to CHG or antibacterial soaps. If your skin becomes reddened/irritated stop using the CHG.  Do not shave (including legs and underarms) for at least 48 hours prior to first CHG shower. It is OK to shave your face.  Please follow these instructions carefully.   1. Shower the NIGHT BEFORE SURGERY and the MORNING OF SURGERY with CHG Soap.   2. If you chose to wash your hair, wash your hair first as usual with your normal shampoo.  3. After you shampoo, rinse your hair and body thoroughly to remove the shampoo.  4. Use CHG as you would any other liquid soap. You can apply CHG directly to the skin and wash gently with a scrungie or a clean  washcloth.   5. Apply the CHG Soap to your body ONLY FROM THE NECK DOWN.  Do not use on open wounds or open sores. Avoid contact with your eyes, ears, mouth and genitals (private parts). Wash Face and genitals (private parts)  with your normal soap.   6. Wash thoroughly, paying special attention to the area where your surgery will be performed.  7. Thoroughly rinse your body with warm water from the neck down.  8. DO NOT shower/wash with your normal soap after using and rinsing off the CHG Soap.  9. Pat yourself dry with a CLEAN TOWEL.  10. Wear CLEAN PAJAMAS to bed  the night before surgery, wear comfortable clothes the morning of surgery  11. Place CLEAN SHEETS on your bed the night of your first shower and DO NOT SLEEP WITH PETS.    Day of Surgery:  Please shower the morning of surgery with the CHG soap Do not apply any deodorants/lotions. Please wear clean clothes to the hospital/surgery center.   Remember to brush your teeth WITH YOUR REGULAR TOOTHPASTE.   Please read over the following fact sheets that you were given.

## 2019-02-26 ENCOUNTER — Other Ambulatory Visit: Payer: Self-pay

## 2019-02-26 ENCOUNTER — Other Ambulatory Visit (HOSPITAL_COMMUNITY)
Admission: RE | Admit: 2019-02-26 | Discharge: 2019-02-26 | Disposition: A | Discharge status: Home / Self Care | Payer: 59 | Source: Ambulatory Visit | Attending: Thoracic Surgery (Cardiothoracic Vascular Surgery) | Admitting: Thoracic Surgery (Cardiothoracic Vascular Surgery)

## 2019-02-26 ENCOUNTER — Encounter (HOSPITAL_COMMUNITY): Payer: Self-pay

## 2019-02-26 ENCOUNTER — Ambulatory Visit (HOSPITAL_COMMUNITY)
Admission: RE | Admit: 2019-02-26 | Discharge: 2019-02-26 | Disposition: A | Discharge status: Home / Self Care | Payer: 59 | Source: Ambulatory Visit | Attending: Thoracic Surgery (Cardiothoracic Vascular Surgery) | Admitting: Thoracic Surgery (Cardiothoracic Vascular Surgery)

## 2019-02-26 ENCOUNTER — Encounter (HOSPITAL_COMMUNITY)
Admission: RE | Admit: 2019-02-26 | Discharge: 2019-02-26 | Disposition: A | Discharge status: Home / Self Care | Payer: 59 | Source: Ambulatory Visit | Attending: Thoracic Surgery (Cardiothoracic Vascular Surgery) | Admitting: Thoracic Surgery (Cardiothoracic Vascular Surgery)

## 2019-02-26 DIAGNOSIS — R911 Solitary pulmonary nodule: Secondary | ICD-10-CM | POA: Insufficient documentation

## 2019-02-26 DIAGNOSIS — Z20822 Contact with and (suspected) exposure to covid-19: Secondary | ICD-10-CM | POA: Insufficient documentation

## 2019-02-26 HISTORY — DX: Cardiac murmur, unspecified: R01.1

## 2019-02-26 LAB — COMPREHENSIVE METABOLIC PANEL
ALT: 45 U/L — ABNORMAL HIGH (ref 0–44)
AST: 29 U/L (ref 15–41)
Albumin: 4.7 g/dL (ref 3.5–5.0)
Alkaline Phosphatase: 73 U/L (ref 38–126)
Anion gap: 11 (ref 5–15)
BUN: 10 mg/dL (ref 6–20)
CO2: 21 mmol/L — ABNORMAL LOW (ref 22–32)
Calcium: 9.1 mg/dL (ref 8.9–10.3)
Chloride: 108 mmol/L (ref 98–111)
Creatinine, Ser: 0.92 mg/dL (ref 0.61–1.24)
GFR calc Af Amer: >60 mL/min (ref >60)
GFR calc non Af Amer: >60 mL/min (ref >60)
Glucose, Bld: 91 mg/dL (ref 70–99)
Potassium: 4 mmol/L (ref 3.5–5.1)
Sodium: 140 mmol/L (ref 135–145)
Total Bilirubin: 0.2 mg/dL — ABNORMAL LOW (ref 0.3–1.2)
Total Protein: 7.3 g/dL (ref 6.5–8.1)

## 2019-02-26 LAB — SARS CORONAVIRUS 2 (TAT 6-24 HRS): SARS Coronavirus 2: NEGATIVE

## 2019-02-26 LAB — URINALYSIS, ROUTINE W REFLEX MICROSCOPIC
Bacteria, UA: NONE SEEN
Bilirubin Urine: NEGATIVE
Glucose, UA: NEGATIVE mg/dL
Ketones, ur: NEGATIVE mg/dL
Nitrite: NEGATIVE
Protein, ur: NEGATIVE mg/dL
Specific Gravity, Urine: 1.011 (ref 1.005–1.030)
pH: 5 (ref 5.0–8.0)

## 2019-02-26 LAB — CBC
HCT: 41.6 % (ref 39.0–52.0)
Hemoglobin: 14 g/dL (ref 13.0–17.0)
MCH: 30 pg (ref 26.0–34.0)
MCHC: 33.7 g/dL (ref 30.0–36.0)
MCV: 89.1 fL (ref 80.0–100.0)
Platelets: 251 10*3/uL (ref 150–400)
RBC: 4.67 MIL/uL (ref 4.22–5.81)
RDW: 12.1 % (ref 11.5–15.5)
WBC: 8.5 10*3/uL (ref 4.0–10.5)
nRBC: 0 % (ref 0.0–0.2)

## 2019-02-26 LAB — BLOOD GAS, ARTERIAL
Acid-base deficit: 0.5 mmol/L (ref 0.0–2.0)
Bicarbonate: 23.5 mmol/L (ref 20.0–28.0)
Drawn by: 421801
FIO2: 21
O2 Saturation: 96.1 %
Patient temperature: 37
pCO2 arterial: 37.1 mmHg (ref 32.0–48.0)
pH, Arterial: 7.417 (ref 7.350–7.450)
pO2, Arterial: 87.1 mmHg (ref 83.0–108.0)

## 2019-02-26 LAB — SURGICAL PCR SCREEN
MRSA, PCR: NEGATIVE
Staphylococcus aureus: NEGATIVE

## 2019-02-26 LAB — APTT: aPTT: 30 seconds (ref 24–36)

## 2019-02-26 LAB — ABO/RH: ABO/RH(D): O POS

## 2019-02-26 LAB — PROTIME-INR
INR: 1 (ref 0.8–1.2)
Prothrombin Time: 13 seconds (ref 11.4–15.2)

## 2019-02-26 NOTE — Progress Notes (Signed)
PCP - Grier Mitts, MD Cardiologist - Denies  PPM/ICD - Denies  Chest x-ray - 02/26/19 EKG - 02/26/19 Stress Test - 02/27/2019? ECHO - 03/12/16 Cardiac Cath - 02/11/2005  Sleep Study - Yes CPAP - Yes  Patient denies being a diabetic.  Blood Thinner Instructions: N/A Aspirin Instructions: Per patient, per Dr. Kipp Brood, continue taking ASA up until DOS.  ERAS Protcol - N/A  COVID TEST- 02/26/19   Anesthesia review: Yes, patient scheduled for Stress test 02/27/19  Patient denies shortness of breath, fever, cough and chest pain at PAT appointment   Coronavirus Screening  Have you experienced the following symptoms:  Cough yes/no: No Fever (>100.84F)  yes/no: No Runny nose yes/no: No Sore throat yes/no: No Difficulty breathing/shortness of breath  yes/no: No  Have you or a family member traveled in the last 14 days and where? yes/no: No   If the patient indicates "YES" to the above questions, their PAT will be rescheduled to limit the exposure to others and, the surgeon will be notified. THE PATIENT WILL NEED TO BE ASYMPTOMATIC FOR 14 DAYS.   If the patient is not experiencing any of these symptoms, the PAT nurse will instruct them to NOT bring anyone with them to their appointment since they may have these symptoms or traveled as well.   Please remind your patients and families that hospital visitation restrictions are in effect and the importance of the restrictions.   All instructions explained to the patient, with a verbal understanding of the material. Patient agrees to go over the instructions while at home for a better understanding. Patient also instructed to self quarantine after being tested for COVID-19. The opportunity to ask questions was provided.

## 2019-02-27 ENCOUNTER — Ambulatory Visit (HOSPITAL_BASED_OUTPATIENT_CLINIC_OR_DEPARTMENT_OTHER): Payer: 59

## 2019-02-27 DIAGNOSIS — R911 Solitary pulmonary nodule: Secondary | ICD-10-CM

## 2019-02-27 LAB — TYPE AND SCREEN
ABO/RH(D): O POS
Antibody Screen: NEGATIVE

## 2019-02-27 MED ORDER — TECHNETIUM TC 99M TETROFOSMIN IV KIT
11.0000 | PACK | Freq: Once | INTRAVENOUS | Status: AC | PRN
  Administered 2019-02-27: 11 via INTRAVENOUS
  Filled 2019-02-27: qty 11

## 2019-02-27 MED ORDER — DEXTROSE 5 % IV SOLN
3.0000 g | INTRAVENOUS | Status: AC
  Administered 2019-02-28: 3 g via INTRAVENOUS
  Filled 2019-02-27: qty 3
  Filled 2019-02-27: qty 3000

## 2019-02-27 MED ORDER — REGADENOSON 0.4 MG/5ML IV SOLN
0.4000 mg | Freq: Once | INTRAVENOUS | Status: AC
  Administered 2019-02-27: 0.4 mg via INTRAVENOUS

## 2019-02-27 MED ORDER — TECHNETIUM TC 99M TETROFOSMIN IV KIT
32.9000 | PACK | Freq: Once | INTRAVENOUS | Status: AC | PRN
  Administered 2019-02-27: 32.9 via INTRAVENOUS
  Filled 2019-02-27: qty 33

## 2019-02-27 NOTE — Progress Notes (Addendum)
Anesthesia Chart Review:  Case: [id number] Date/Time: 02/28/19 0715   Procedures:      VIDEO BRONCHOSCOPY (N/A )     XI ROBOTIC ASSISTED THORASCOPY-RLL WEDGE, POSSIBLE LOBECTOMY (Right Chest)   Anesthesia type: General   Pre-op diagnosis: Pulmonary nodule   Location: [facility] OR ROOM 10 / [facility] OR   Surgeons: Lajuana Matte, MD      DISCUSSION: Patient is a 59 year old male scheduled for the above procedure. He has a RLL lung nodule (seen incidentally on CT abd/pelvis for evaluation of flank pain) and needs tissue diagnosis.  Other history includes former smoker (quit 03/31/14), OSA (CPAP), HLD, murmur (no significant valve disease 2018), family history of premature heart disease, recurrent UTI, MRSA (2011, back). Fatty infiltration of the liver seen on 01/21/19 CT.  Dr. Kipp Brood ordered a preoperative nuclear stress test done today (02/27/19). Communicated with [facility] RN Ryan around 5:00 PM that results were still pending, and she was to follow-up with Dr. Kipp Brood. Results still pending as of 6:30 PM.   02/26/19 presurgical COVID-19 test was negative.   VS: BP 122/60   Pulse 77   Temp 36.9 C (Oral)   Resp 20   Ht 6\' 1"  (1.854 m)   Wt (!) 139.4 kg   SpO2 98%   BMI 40.56 kg/m   PROVIDERS: [name], MD is PCP  June Leap, DO is pulmonologist   LABS: Labs reviewed: Acceptable for surgery. (all labs ordered are listed, but only abnormal results are displayed)  Labs Reviewed  COMPREHENSIVE METABOLIC PANEL - Abnormal; Notable for the following components:      Result Value   CO2 21 (*)    ALT 45 (*)    Total Bilirubin 0.2 (*)    All other components within normal limits  URINALYSIS, ROUTINE W REFLEX MICROSCOPIC - Abnormal; Notable for the following components:   Color, Urine STRAW (*)    Hgb urine dipstick SMALL (*)    Leukocytes,Ua TRACE (*)    All other components within normal limits  SURGICAL PCR SCREEN  APTT  BLOOD GAS, ARTERIAL  CBC  PROTIME-INR  TYPE AND  SCREEN  ABO/RH    PFTs 02/04/19: FVC 3.64 (83%), post 4.33 (99%) FEV1 2.82 (83%), post 3.48 (102%) DLCO unc 36.56 (123%)   IMAGES: CXR 02/26/19: IMPRESSION: Ill-defined right lower lung nodule, as seen on previous PET-CT. No acute findings.  PET scan 02/15/19: IMPRESSION: 1. The cavitary right lower lobe lung lesion does not demonstrate significant hypermetabolic activity, although remains suspicious for bronchogenic carcinoma (likely adenocarcinoma) based on morphology. Tissue sampling should be considered. 2. No evidence of metastatic disease. 3. Stable incidental findings including aortic atherosclerosis and hepatic steatosis.   EKG: 02/26/19: Normal sinus rhythm Minimal voltage criteria for LVH, may be normal variant ( Sokolow-Lyon ) Nonspecific T wave abnormality Abnormal ECG No significant change since last tracing Confirmed by Shelva Majestic [id number]) on 02/26/2019 6:11:54 PM   CV: Nuclear stress test 02/27/19: In process.    Echo 03/12/16: Impressions:  - Normal LV size with mild LV hypertrophy. EF 60-65%. Grade 1 diastolic dysfunction. Normal RV  size and systolic function. No significant valvular abnormalities.    Cardiac cath 02/11/05:  CONCLUSIONS:  1.  Essentially normal coronaries with minimal luminal irregularities noted      in the proximal and mid left anterior descending and diagonal #1.  The      circumflex and right coronaries are essentially normal.  2.  Normal left ventricular function.  3.  Abnormal electrocardiogram possibly related to early repolarization      versus early pericarditis.   Past Medical History:  Diagnosis Date  . Allergic rhinitis, seasonal   . Allergy    seasonal  . Colon polyp 02/2012   tubular adenoma, Dr. Zenovia Jarred  . De Quervain's tenosynovitis, right    hx/o  . Epididymitis    hx/o as adult  . Erectile dysfunction   . Family history of premature CAD    father  . Heart murmur   . Hemorrhoids    hx of  .  Hyperlipidemia   . Left inguinal hernia    tiny as of 03/2014  . MRSA (methicillin resistant staph aureus) culture positive    history of MRSA skin on pt's back/ unsure of date  . Normal cardiac stress test    remote past per patient, [place], [place]  . Obesity   . OSA on CPAP   . Recurrent UTI    Dr. Janice Norrie, [facility]  . Sleep apnea    uses c-pap  . Tinea pedis   . Tobacco use    4 cigarrettes daily  . Wears glasses     Past Surgical History:  Procedure Laterality Date  . ADENOIDECTOMY    . CARDIAC CATHETERIZATION  02/11/2005  . COLONOSCOPY  02/2012   Dr. Zenovia Jarred, repeat 02/2017  . ESOPHAGEAL DILATION    . ROTATOR CUFF REPAIR  1997   left side  . SKIN SURGERY     large area of MRSA on back/ over 10 years ago  . TONSILLECTOMY    . UPPER GASTROINTESTINAL ENDOSCOPY      MEDICATIONS: . ASPIRIN LOW DOSE 81 MG EC tablet  . Avanafil (STENDRA) 200 MG TABS  . cetirizine (ZYRTEC) 10 MG tablet  . fluticasone (FLONASE) 50 MCG/ACT nasal spray  . hydrocortisone (PROCTOSOL HC) 2.5 % rectal cream  . naproxen (NAPROSYN) 500 MG tablet  . NON FORMULARY  . pravastatin (PRAVACHOL) 20 MG tablet  . sildenafil (REVATIO) 20 MG tablet  . tiZANidine (ZANAFLEX) 2 MG tablet   . 0.9 %  sodium chloride infusion  . 0.9 %  sodium chloride infusion   . [START ON 02/28/2019] ceFAZolin (ANCEF) 3 g in dextrose 5 % 50 mL IVPB     Myra Gianotti, PA-C Surgical Short Stay/Anesthesiology [facility] Phone [phone] [facility] Phone [phone] 02/27/2019 6:30 PM

## 2019-02-27 NOTE — Anesthesia Preprocedure Evaluation (Addendum)
Anesthesia Evaluation  Patient identified by MRN, date of birth, ID band Patient awake    Reviewed: Allergy & Precautions, NPO status , Patient's Chart, lab work & pertinent test results  History of Anesthesia Complications Negative for: history of anesthetic complications  Airway Mallampati: III  TM Distance: >3 FB Neck ROM: Full    Dental no notable dental hx. (+) Dental Advisory Given   Pulmonary sleep apnea , former smoker,  Pulmonary Mass   Pulmonary exam normal        Cardiovascular negative cardio ROS Normal cardiovascular exam  Study Highlights   The left ventricular ejection fraction is normal (55-65%).  Nuclear stress EF: 61%.       Neuro/Psych negative neurological ROS     GI/Hepatic negative GI ROS, Neg liver ROS,   Endo/Other  Morbid obesity  Renal/GU negative Renal ROS     Musculoskeletal negative musculoskeletal ROS (+)   Abdominal   Peds  Hematology negative hematology ROS (+)   Anesthesia Other Findings Day of surgery medications reviewed with the patient.  Reproductive/Obstetrics                           Anesthesia Physical Anesthesia Plan  ASA: III  Anesthesia Plan: General   Post-op Pain Management:    Induction: Intravenous  PONV Risk Score and Plan: 3 and Ondansetron, Dexamethasone and Midazolam  Airway Management Planned: Double Lumen EBT  Additional Equipment: Arterial line  Intra-op Plan:   Post-operative Plan: Extubation in OR  Informed Consent: I have reviewed the patients History and Physical, chart, labs and discussed the procedure including the risks, benefits and alternatives for the proposed anesthesia with the patient or authorized representative who has indicated his/her understanding and acceptance.     Dental advisory given  Plan Discussed with: CRNA and Anesthesiologist  Anesthesia Plan Comments:        Anesthesia  Quick Evaluation

## 2019-02-28 ENCOUNTER — Inpatient Hospital Stay (HOSPITAL_COMMUNITY): Payer: 59

## 2019-02-28 ENCOUNTER — Other Ambulatory Visit: Payer: Self-pay

## 2019-02-28 ENCOUNTER — Inpatient Hospital Stay (HOSPITAL_COMMUNITY)
Admission: RE | Admit: 2019-02-28 | Discharge: 2019-03-02 | DRG: 164 | Disposition: A | Discharge status: Home / Self Care | Payer: 59 | Attending: Thoracic Surgery (Cardiothoracic Vascular Surgery) | Admitting: Thoracic Surgery (Cardiothoracic Vascular Surgery)

## 2019-02-28 ENCOUNTER — Inpatient Hospital Stay (HOSPITAL_COMMUNITY): Payer: 59 | Admitting: Anesthesiology

## 2019-02-28 ENCOUNTER — Inpatient Hospital Stay (HOSPITAL_COMMUNITY): Payer: 59 | Admitting: Vascular Surgery

## 2019-02-28 ENCOUNTER — Encounter (HOSPITAL_COMMUNITY)
Admission: RE | Disposition: A | Discharge status: Home / Self Care | Payer: Self-pay | Source: Home / Self Care | Attending: Thoracic Surgery (Cardiothoracic Vascular Surgery)

## 2019-02-28 DIAGNOSIS — Z79899 Other long term (current) drug therapy: Secondary | ICD-10-CM

## 2019-02-28 DIAGNOSIS — Z6841 Body Mass Index (BMI) 40.0 and over, adult: Secondary | ICD-10-CM | POA: Diagnosis not present

## 2019-02-28 DIAGNOSIS — Z09 Encounter for follow-up examination after completed treatment for conditions other than malignant neoplasm: Secondary | ICD-10-CM

## 2019-02-28 DIAGNOSIS — Z87891 Personal history of nicotine dependence: Secondary | ICD-10-CM

## 2019-02-28 DIAGNOSIS — Z791 Long term (current) use of non-steroidal anti-inflammatories (NSAID): Secondary | ICD-10-CM | POA: Diagnosis not present

## 2019-02-28 DIAGNOSIS — R911 Solitary pulmonary nodule: Secondary | ICD-10-CM | POA: Diagnosis present

## 2019-02-28 DIAGNOSIS — Z87440 Personal history of urinary (tract) infections: Secondary | ICD-10-CM

## 2019-02-28 DIAGNOSIS — C3431 Malignant neoplasm of lower lobe, right bronchus or lung: Principal | ICD-10-CM | POA: Diagnosis present

## 2019-02-28 DIAGNOSIS — G4733 Obstructive sleep apnea (adult) (pediatric): Secondary | ICD-10-CM | POA: Diagnosis present

## 2019-02-28 DIAGNOSIS — Z8249 Family history of ischemic heart disease and other diseases of the circulatory system: Secondary | ICD-10-CM

## 2019-02-28 DIAGNOSIS — Z8614 Personal history of Methicillin resistant Staphylococcus aureus infection: Secondary | ICD-10-CM

## 2019-02-28 DIAGNOSIS — E785 Hyperlipidemia, unspecified: Secondary | ICD-10-CM | POA: Diagnosis present

## 2019-02-28 DIAGNOSIS — K649 Unspecified hemorrhoids: Secondary | ICD-10-CM | POA: Diagnosis present

## 2019-02-28 DIAGNOSIS — Z20822 Contact with and (suspected) exposure to covid-19: Secondary | ICD-10-CM | POA: Diagnosis present

## 2019-02-28 DIAGNOSIS — R918 Other nonspecific abnormal finding of lung field: Secondary | ICD-10-CM

## 2019-02-28 DIAGNOSIS — Z7982 Long term (current) use of aspirin: Secondary | ICD-10-CM

## 2019-02-28 DIAGNOSIS — J939 Pneumothorax, unspecified: Secondary | ICD-10-CM

## 2019-02-28 HISTORY — PX: NODE DISSECTION: SHX5269

## 2019-02-28 HISTORY — PX: INTERCOSTAL NERVE BLOCK: SHX5021

## 2019-02-28 HISTORY — PX: VIDEO BRONCHOSCOPY: SHX5072

## 2019-02-28 LAB — MYOCARDIAL PERFUSION IMAGING
LV dias vol: 142 mL (ref 62–150)
LV sys vol: 56 mL
Peak HR: 99 {beats}/min
Rest HR: 78 {beats}/min
SDS: 2
SRS: 0
SSS: 2
TID: 1.07

## 2019-02-28 LAB — GLUCOSE, CAPILLARY: Glucose-Capillary: 127 mg/dL — ABNORMAL HIGH (ref 70–99)

## 2019-02-28 SURGERY — BRONCHOSCOPY, VIDEO-ASSISTED
Anesthesia: General | Site: Chest | Laterality: Right

## 2019-02-28 MED ORDER — DEXAMETHASONE SODIUM PHOSPHATE 10 MG/ML IJ SOLN
INTRAMUSCULAR | Status: DC | PRN
  Administered 2019-02-28: 10 mg via INTRAVENOUS

## 2019-02-28 MED ORDER — ASPIRIN EC 81 MG PO TBEC
81.0000 mg | DELAYED_RELEASE_TABLET | Freq: Every day | ORAL | Status: DC
  Administered 2019-03-01 – 2019-03-02 (×2): 81 mg via ORAL
  Filled 2019-02-28 (×2): qty 1

## 2019-02-28 MED ORDER — CHLORHEXIDINE GLUCONATE CLOTH 2 % EX PADS
6.0000 | MEDICATED_PAD | Freq: Every day | CUTANEOUS | Status: DC
  Administered 2019-03-01: 15:00:00 6 via TOPICAL

## 2019-02-28 MED ORDER — ACETAMINOPHEN 160 MG/5ML PO SOLN: 1000.0000 mg | Freq: Four times a day (QID) | ORAL | Status: DC

## 2019-02-28 MED ORDER — PHENYLEPHRINE HCL-NACL 10-0.9 MG/250ML-% IV SOLN
INTRAVENOUS | Status: DC | PRN
  Administered 2019-02-28: 20 ug/min via INTRAVENOUS

## 2019-02-28 MED ORDER — SUCCINYLCHOLINE CHLORIDE 20 MG/ML IJ SOLN
INTRAMUSCULAR | Status: DC | PRN
  Administered 2019-02-28: 140 mg via INTRAVENOUS

## 2019-02-28 MED ORDER — SUGAMMADEX SODIUM 200 MG/2ML IV SOLN
INTRAVENOUS | Status: DC | PRN
  Administered 2019-02-28: 300 mg via INTRAVENOUS

## 2019-02-28 MED ORDER — ROCURONIUM BROMIDE 10 MG/ML (PF) SYRINGE
PREFILLED_SYRINGE | INTRAVENOUS | Status: AC
  Filled 2019-02-28: qty 10

## 2019-02-28 MED ORDER — LIDOCAINE 2% (20 MG/ML) 5 ML SYRINGE
INTRAMUSCULAR | Status: AC
  Filled 2019-02-28: qty 5

## 2019-02-28 MED ORDER — SODIUM CHLORIDE 0.9 % IV SOLN: INTRAVENOUS | Status: DC | PRN

## 2019-02-28 MED ORDER — ENOXAPARIN SODIUM 40 MG/0.4ML ~~LOC~~ SOLN
40.0000 mg | SUBCUTANEOUS | Status: DC
  Administered 2019-02-28 – 2019-03-01 (×2): 40 mg via SUBCUTANEOUS
  Filled 2019-02-28 (×2): qty 0.4

## 2019-02-28 MED ORDER — CELECOXIB 200 MG PO CAPS
400.0000 mg | ORAL_CAPSULE | Freq: Once | ORAL | Status: AC
  Administered 2019-02-28: 06:00:00 400 mg via ORAL
  Filled 2019-02-28: qty 2

## 2019-02-28 MED ORDER — BUPIVACAINE LIPOSOME 1.3 % IJ SUSP
20.0000 mL | Freq: Once | INTRAMUSCULAR | Status: DC
  Filled 2019-02-28: qty 20

## 2019-02-28 MED ORDER — PROMETHAZINE HCL 25 MG/ML IJ SOLN: 6.2500 mg | INTRAMUSCULAR | Status: DC | PRN

## 2019-02-28 MED ORDER — ROCURONIUM BROMIDE 10 MG/ML (PF) SYRINGE
PREFILLED_SYRINGE | INTRAVENOUS | Status: AC
  Filled 2019-02-28: qty 30

## 2019-02-28 MED ORDER — KETOROLAC TROMETHAMINE 30 MG/ML IJ SOLN
INTRAMUSCULAR | Status: DC | PRN
  Administered 2019-02-28: 30 mg via INTRAVENOUS

## 2019-02-28 MED ORDER — 0.9 % SODIUM CHLORIDE (POUR BTL) OPTIME
TOPICAL | Status: DC | PRN
  Administered 2019-02-28: 09:00:00 3000 mL

## 2019-02-28 MED ORDER — PRAVASTATIN SODIUM 10 MG PO TABS
20.0000 mg | ORAL_TABLET | Freq: Every day | ORAL | Status: DC
  Administered 2019-02-28 – 2019-03-01 (×2): 20 mg via ORAL
  Filled 2019-02-28 (×2): qty 2

## 2019-02-28 MED ORDER — FENTANYL CITRATE (PF) 250 MCG/5ML IJ SOLN
INTRAMUSCULAR | Status: AC
  Filled 2019-02-28: qty 5

## 2019-02-28 MED ORDER — FENTANYL CITRATE (PF) 100 MCG/2ML IJ SOLN
INTRAMUSCULAR | Status: DC | PRN
  Administered 2019-02-28 (×2): 100 ug via INTRAVENOUS
  Administered 2019-02-28: 150 ug via INTRAVENOUS
  Administered 2019-02-28 (×6): 50 ug via INTRAVENOUS

## 2019-02-28 MED ORDER — BUPIVACAINE HCL (PF) 0.5 % IJ SOLN
INTRAMUSCULAR | Status: AC
  Filled 2019-02-28: qty 30

## 2019-02-28 MED ORDER — ACETAMINOPHEN 500 MG PO TABS
1000.0000 mg | ORAL_TABLET | Freq: Once | ORAL | Status: AC
  Administered 2019-02-28: 1000 mg via ORAL
  Filled 2019-02-28: qty 2

## 2019-02-28 MED ORDER — DEXMEDETOMIDINE HCL IN NACL 200 MCG/50ML IV SOLN
INTRAVENOUS | Status: DC | PRN
  Administered 2019-02-28 (×8): 4 ug via INTRAVENOUS

## 2019-02-28 MED ORDER — MIDAZOLAM HCL 2 MG/2ML IJ SOLN
INTRAMUSCULAR | Status: AC
  Filled 2019-02-28: qty 2

## 2019-02-28 MED ORDER — PROPOFOL 10 MG/ML IV BOLUS
INTRAVENOUS | Status: AC
  Filled 2019-02-28: qty 20

## 2019-02-28 MED ORDER — SODIUM CHLORIDE FLUSH 0.9 % IV SOLN
INTRAVENOUS | Status: DC | PRN
  Administered 2019-02-28: 12:00:00 100 mL

## 2019-02-28 MED ORDER — KETOROLAC TROMETHAMINE 30 MG/ML IJ SOLN
30.0000 mg | Freq: Four times a day (QID) | INTRAMUSCULAR | Status: DC
  Administered 2019-02-28 – 2019-03-02 (×8): 30 mg via INTRAVENOUS
  Filled 2019-02-28 (×8): qty 1

## 2019-02-28 MED ORDER — MORPHINE SULFATE (PF) 2 MG/ML IV SOLN
2.0000 mg | INTRAVENOUS | Status: DC | PRN
  Administered 2019-02-28: 2 mg via INTRAVENOUS
  Filled 2019-02-28: qty 1

## 2019-02-28 MED ORDER — ONDANSETRON HCL 4 MG/2ML IJ SOLN
INTRAMUSCULAR | Status: DC | PRN
  Administered 2019-02-28: 4 mg via INTRAVENOUS

## 2019-02-28 MED ORDER — DEXAMETHASONE SODIUM PHOSPHATE 10 MG/ML IJ SOLN
INTRAMUSCULAR | Status: AC
  Filled 2019-02-28: qty 1

## 2019-02-28 MED ORDER — FENTANYL CITRATE (PF) 100 MCG/2ML IJ SOLN
25.0000 ug | INTRAMUSCULAR | Status: DC | PRN
  Administered 2019-02-28: 13:00:00 50 ug via INTRAVENOUS
  Administered 2019-02-28: 25 ug via INTRAVENOUS

## 2019-02-28 MED ORDER — ROCURONIUM 10MG/ML (10ML) SYRINGE FOR MEDFUSION PUMP - OPTIME
INTRAVENOUS | Status: DC | PRN
  Administered 2019-02-28: 100 mg via INTRAVENOUS
  Administered 2019-02-28 (×3): 20 mg via INTRAVENOUS
  Administered 2019-02-28: 10 mg via INTRAVENOUS
  Administered 2019-02-28: 80 mg via INTRAVENOUS

## 2019-02-28 MED ORDER — LIDOCAINE 2% (20 MG/ML) 5 ML SYRINGE
INTRAMUSCULAR | Status: DC | PRN
  Administered 2019-02-28: 100 mg via INTRAVENOUS

## 2019-02-28 MED ORDER — FLUTICASONE PROPIONATE 50 MCG/ACT NA SUSP
2.0000 | Freq: Every day | NASAL | Status: DC
  Administered 2019-03-01: 2 via NASAL
  Filled 2019-02-28: qty 16

## 2019-02-28 MED ORDER — BISACODYL 5 MG PO TBEC
10.0000 mg | DELAYED_RELEASE_TABLET | Freq: Every day | ORAL | Status: DC
  Administered 2019-03-01 – 2019-03-02 (×2): 10 mg via ORAL
  Filled 2019-02-28 (×2): qty 2

## 2019-02-28 MED ORDER — PROPOFOL 10 MG/ML IV BOLUS
INTRAVENOUS | Status: DC | PRN
  Administered 2019-02-28: 200 mg via INTRAVENOUS

## 2019-02-28 MED ORDER — ONDANSETRON HCL 4 MG/2ML IJ SOLN: 4.0000 mg | Freq: Four times a day (QID) | INTRAMUSCULAR | Status: DC | PRN

## 2019-02-28 MED ORDER — CEFAZOLIN SODIUM-DEXTROSE 2-4 GM/100ML-% IV SOLN
2.0000 g | Freq: Three times a day (TID) | INTRAVENOUS | Status: AC
  Administered 2019-02-28 (×2): 2 g via INTRAVENOUS
  Filled 2019-02-28 (×2): qty 100

## 2019-02-28 MED ORDER — ONDANSETRON HCL 4 MG/2ML IJ SOLN
INTRAMUSCULAR | Status: AC
  Filled 2019-02-28: qty 2

## 2019-02-28 MED ORDER — TRAMADOL HCL 50 MG PO TABS
50.0000 mg | ORAL_TABLET | Freq: Four times a day (QID) | ORAL | Status: DC | PRN
  Administered 2019-02-28 – 2019-03-02 (×5): 100 mg via ORAL
  Filled 2019-02-28 (×5): qty 2

## 2019-02-28 MED ORDER — FENTANYL CITRATE (PF) 100 MCG/2ML IJ SOLN
INTRAMUSCULAR | Status: AC
  Filled 2019-02-28: qty 2

## 2019-02-28 MED ORDER — LACTATED RINGERS IV SOLN: INTRAVENOUS | Status: DC | PRN

## 2019-02-28 MED ORDER — ACETAMINOPHEN 500 MG PO TABS
1000.0000 mg | ORAL_TABLET | Freq: Four times a day (QID) | ORAL | Status: DC
  Administered 2019-02-28 – 2019-03-02 (×8): 1000 mg via ORAL
  Filled 2019-02-28 (×8): qty 2

## 2019-02-28 MED ORDER — SENNOSIDES-DOCUSATE SODIUM 8.6-50 MG PO TABS
1.0000 | ORAL_TABLET | Freq: Every day | ORAL | Status: DC
  Administered 2019-02-28 – 2019-03-01 (×2): 1 via ORAL
  Filled 2019-02-28 (×2): qty 1

## 2019-02-28 MED ORDER — LORATADINE 10 MG PO TABS
10.0000 mg | ORAL_TABLET | Freq: Every day | ORAL | Status: DC
  Administered 2019-03-01 – 2019-03-02 (×2): 10 mg via ORAL
  Filled 2019-02-28 (×2): qty 1

## 2019-02-28 MED ORDER — INSULIN ASPART 100 UNIT/ML ~~LOC~~ SOLN
0.0000 [IU] | Freq: Four times a day (QID) | SUBCUTANEOUS | Status: DC
  Administered 2019-02-28 – 2019-03-01 (×2): 2 [IU] via SUBCUTANEOUS

## 2019-02-28 MED ORDER — HEMOSTATIC AGENTS (NO CHARGE) OPTIME
TOPICAL | Status: DC | PRN
  Administered 2019-02-28: 3 via TOPICAL

## 2019-02-28 MED ORDER — PHENYLEPHRINE 40 MCG/ML (10ML) SYRINGE FOR IV PUSH (FOR BLOOD PRESSURE SUPPORT)
PREFILLED_SYRINGE | INTRAVENOUS | Status: AC
  Filled 2019-02-28: qty 10

## 2019-02-28 MED ORDER — SODIUM CHLORIDE 0.9 % IR SOLN
Status: DC | PRN
  Administered 2019-02-28: 1000 mL

## 2019-02-28 MED ORDER — MIDAZOLAM HCL 5 MG/5ML IJ SOLN
INTRAMUSCULAR | Status: DC | PRN
  Administered 2019-02-28: 2 mg via INTRAVENOUS

## 2019-02-28 SURGICAL SUPPLY — 139 items
ADH SKN CLS LQ APL DERMABOND (GAUZE/BANDAGES/DRESSINGS) ×2
APL PRP STRL LF DISP 70% ISPRP (MISCELLANEOUS) ×2
APPLIER CLIP ROT 10 11.4 M/L (STAPLE)
APR CLP MED LRG 11.4X10 (STAPLE)
BAG SPEC RTRVL LRG 6X4 10 (ENDOMECHANICALS)
BLADE CLIPPER SURG (BLADE) ×4 IMPLANT
CANISTER SUCT 3000ML PPV (MISCELLANEOUS) ×4 IMPLANT
CANNULA REDUC XI 12-8 STAPL (CANNULA) ×2
CANNULA REDUC XI 12-8MM STAPL (CANNULA) ×2
CANNULA REDUCER 12-8 DVNC XI (CANNULA) ×4 IMPLANT
CATH THORACIC 28FR (CATHETERS) IMPLANT
CATH THORACIC 28FR RT ANG (CATHETERS) IMPLANT
CATH THORACIC 36FR (CATHETERS) IMPLANT
CATH THORACIC 36FR RT ANG (CATHETERS) IMPLANT
CATH TROCAR 20FR (CATHETERS) IMPLANT
CHLORAPREP W/TINT 26 (MISCELLANEOUS) ×4 IMPLANT
CLIP APPLIE ROT 10 11.4 M/L (STAPLE) IMPLANT
CLIP VESOCCLUDE MED 6/CT (CLIP) IMPLANT
CNTNR URN SCR LID CUP LEK RST (MISCELLANEOUS) ×20 IMPLANT
CONN ST 1/4X3/8  BEN (MISCELLANEOUS)
CONN ST 1/4X3/8 BEN (MISCELLANEOUS) IMPLANT
CONN Y 3/8X3/8X3/8  BEN (MISCELLANEOUS)
CONN Y 3/8X3/8X3/8 BEN (MISCELLANEOUS) IMPLANT
CONT SPEC 4OZ STRL OR WHT (MISCELLANEOUS) ×40
COVER SURGICAL LIGHT HANDLE (MISCELLANEOUS) IMPLANT
DEFOGGER SCOPE WARMER CLEARIFY (MISCELLANEOUS) ×4 IMPLANT
DERMABOND ADHESIVE PROPEN (GAUZE/BANDAGES/DRESSINGS) ×2
DERMABOND ADVANCED .7 DNX6 (GAUZE/BANDAGES/DRESSINGS) ×2 IMPLANT
DISSECTOR BLUNT TIP ENDO 5MM (MISCELLANEOUS) IMPLANT
DRAIN CHANNEL 28F RND 3/8 FF (WOUND CARE) IMPLANT
DRAIN CHANNEL 32F RND 10.7 FF (WOUND CARE) IMPLANT
DRAPE ARM DVNC X/XI (DISPOSABLE) ×8 IMPLANT
DRAPE COLUMN DVNC XI (DISPOSABLE) ×2 IMPLANT
DRAPE CV SPLIT W-CLR ANES SCRN (DRAPES) ×4 IMPLANT
DRAPE DA VINCI XI ARM (DISPOSABLE) ×8
DRAPE DA VINCI XI COLUMN (DISPOSABLE) ×2
DRAPE ORTHO SPLIT 77X108 STRL (DRAPES) ×4
DRAPE SURG ORHT 6 SPLT 77X108 (DRAPES) ×2 IMPLANT
DRAPE WARM FLUID 44X44 (DRAPES) IMPLANT
ELECT BLADE 6.5 EXT (BLADE) IMPLANT
ELECT REM PT RETURN 9FT ADLT (ELECTROSURGICAL) ×4
ELECTRODE REM PT RTRN 9FT ADLT (ELECTROSURGICAL) ×2 IMPLANT
GAUZE KITTNER 4X10 (MISCELLANEOUS) ×8 IMPLANT
GAUZE SPONGE 4X4 12PLY STRL (GAUZE/BANDAGES/DRESSINGS) ×4 IMPLANT
GAUZE SPONGE 4X4 12PLY STRL LF (GAUZE/BANDAGES/DRESSINGS) ×4 IMPLANT
GLOVE BIO SURGEON STRL SZ7 (GLOVE) ×8 IMPLANT
GLOVE BIO SURGEON STRL SZ7.5 (GLOVE) ×4 IMPLANT
GLOVE BIOGEL M 6.5 STRL (GLOVE) ×12 IMPLANT
GLOVE BIOGEL PI IND STRL 7.0 (GLOVE) ×2 IMPLANT
GLOVE BIOGEL PI INDICATOR 7.0 (GLOVE) ×2
GLOVE INDICATOR 6.5 STRL GRN (GLOVE) ×4 IMPLANT
GLOVE TRIUMPH SURG SIZE 7.5 (KITS) ×8 IMPLANT
GOWN STRL REUS W/ TWL LRG LVL3 (GOWN DISPOSABLE) ×6 IMPLANT
GOWN STRL REUS W/ TWL XL LVL3 (GOWN DISPOSABLE) ×8 IMPLANT
GOWN STRL REUS W/TWL LRG LVL3 (GOWN DISPOSABLE) ×12
GOWN STRL REUS W/TWL XL LVL3 (GOWN DISPOSABLE) ×16
HANDLE STAPLE ENDO GIA SHORT (STAPLE)
HEMOSTAT SURGICEL 2X14 (HEMOSTASIS) ×12 IMPLANT
IRRIG SUCT STRYKERFLOW 2 WTIP (MISCELLANEOUS) ×4
IRRIGATION SUCT STRKRFLW 2 WTP (MISCELLANEOUS) ×2 IMPLANT
IRRIGATOR SUCT 8 DISP DVNC XI (IRRIGATION / IRRIGATOR) IMPLANT
IRRIGATOR SUCTION 8MM XI DISP (IRRIGATION / IRRIGATOR)
KIT BASIN OR (CUSTOM PROCEDURE TRAY) ×4 IMPLANT
KIT SUCTION CATH 14FR (SUCTIONS) IMPLANT
KIT TURNOVER KIT B (KITS) ×4 IMPLANT
LOOP VESSEL SUPERMAXI WHITE (MISCELLANEOUS) IMPLANT
NEEDLE 22X1 1/2 (OR ONLY) (NEEDLE) ×4 IMPLANT
NS IRRIG 1000ML POUR BTL (IV SOLUTION) ×12 IMPLANT
OBTURATOR OPTICAL STANDARD 8MM (TROCAR) ×2
OBTURATOR OPTICAL STND 8 DVNC (TROCAR) ×2
OBTURATOR OPTICALSTD 8 DVNC (TROCAR) ×2 IMPLANT
PACK CHEST (CUSTOM PROCEDURE TRAY) ×4 IMPLANT
PAD ARMBOARD 7.5X6 YLW CONV (MISCELLANEOUS) ×8 IMPLANT
POUCH ENDO CATCH II 15MM (MISCELLANEOUS) IMPLANT
POUCH SPECIMEN RETRIEVAL 10MM (ENDOMECHANICALS) IMPLANT
RELOAD STAPLER 2.5X45 WHT DVNC (STAPLE) ×6 IMPLANT
RELOAD STAPLER 3.5X45 BLU DVNC (STAPLE) ×14 IMPLANT
RELOAD STAPLER 4.3X45 GRN DVNC (STAPLE) ×4 IMPLANT
RETRACTOR WOUND ALXS 19CM XSML (INSTRUMENTS) IMPLANT
RTRCTR WOUND ALEXIS 19CM XSML (INSTRUMENTS)
SCISSORS LAP 5X35 DISP (ENDOMECHANICALS) IMPLANT
SEAL CANN UNIV 5-8 DVNC XI (MISCELLANEOUS) ×4 IMPLANT
SEAL XI 5MM-8MM UNIVERSAL (MISCELLANEOUS) ×4
SEALANT PROGEL (MISCELLANEOUS) IMPLANT
SEALANT SURG COSEAL 4ML (VASCULAR PRODUCTS) IMPLANT
SEALANT SURG COSEAL 8ML (VASCULAR PRODUCTS) IMPLANT
SEALER LIGASURE MARYLAND 30 (ELECTROSURGICAL) IMPLANT
SET IRRIG TUBING LAPAROSCOPIC (IRRIGATION / IRRIGATOR) IMPLANT
SET TUBE SMOKE EVAC HIGH FLOW (TUBING) IMPLANT
SHEET MEDIUM DRAPE 40X70 STRL (DRAPES) ×4 IMPLANT
SPECIMEN JAR MEDIUM (MISCELLANEOUS) IMPLANT
SPONGE INTESTINAL PEANUT (DISPOSABLE) IMPLANT
SPONGE TONSIL TAPE 1 RFD (DISPOSABLE) ×4 IMPLANT
STAPLER 45 SUREFORM CVD (STAPLE) ×2
STAPLER 45 SUREFORM CVD DVNC (STAPLE) ×2 IMPLANT
STAPLER CANNULA SEAL DVNC XI (STAPLE) ×4 IMPLANT
STAPLER CANNULA SEAL XI (STAPLE) ×4
STAPLER ENDO GIA 12MM SHORT (STAPLE) IMPLANT
STAPLER RELOAD 2.5X45 WHITE (STAPLE) ×6
STAPLER RELOAD 2.5X45 WHT DVNC (STAPLE) ×6
STAPLER RELOAD 3.5X45 BLU DVNC (STAPLE) ×14
STAPLER RELOAD 3.5X45 BLUE (STAPLE) ×14
STAPLER RELOAD 4.3X45 GREEN (STAPLE) ×4
STAPLER RELOAD 4.3X45 GRN DVNC (STAPLE) ×4
STOPCOCK 4 WAY LG BORE MALE ST (IV SETS) ×4 IMPLANT
SUT MNCRL AB 3-0 PS2 18 (SUTURE) IMPLANT
SUT MON AB 2-0 CT1 36 (SUTURE) IMPLANT
SUT PDS AB 1 CTX 36 (SUTURE) IMPLANT
SUT PROLENE 4 0 RB 1 (SUTURE)
SUT PROLENE 4-0 RB1 .5 CRCL 36 (SUTURE) IMPLANT
SUT SILK  1 MH (SUTURE)
SUT SILK 1 MH (SUTURE) IMPLANT
SUT SILK 1 TIES 10X30 (SUTURE) IMPLANT
SUT SILK 2 0 SH (SUTURE) IMPLANT
SUT SILK 2 0SH CR/8 30 (SUTURE) IMPLANT
SUT VIC AB 1 CTX 36 (SUTURE)
SUT VIC AB 1 CTX36XBRD ANBCTR (SUTURE) IMPLANT
SUT VIC AB 2-0 CT1 27 (SUTURE) ×4
SUT VIC AB 2-0 CT1 TAPERPNT 27 (SUTURE) ×2 IMPLANT
SUT VIC AB 2-0 CTX 36 (SUTURE) IMPLANT
SUT VIC AB 3-0 SH 27 (SUTURE) ×12
SUT VIC AB 3-0 SH 27X BRD (SUTURE) ×6 IMPLANT
SUT VICRYL 0 TIES 12 18 (SUTURE) ×4 IMPLANT
SUT VICRYL 0 UR6 27IN ABS (SUTURE) ×4 IMPLANT
SUT VICRYL 2 TP 1 (SUTURE) IMPLANT
SYR 10ML LL (SYRINGE) ×4 IMPLANT
SYR 20ML ECCENTRIC (SYRINGE) ×4 IMPLANT
SYR 30ML LL (SYRINGE) ×4 IMPLANT
SYR 50ML LL SCALE MARK (SYRINGE) ×4 IMPLANT
SYSTEM SAHARA CHEST DRAIN ATS (WOUND CARE) ×4 IMPLANT
TAPE CLOTH 4X10 WHT NS (GAUZE/BANDAGES/DRESSINGS) ×4 IMPLANT
TAPE CLOTH SURG 4X10 WHT LF (GAUZE/BANDAGES/DRESSINGS) ×4 IMPLANT
TIP APPLICATOR SPRAY EXTEND 16 (VASCULAR PRODUCTS) IMPLANT
TOWEL GREEN STERILE (TOWEL DISPOSABLE) ×4 IMPLANT
TOWEL GREEN STERILE FF (TOWEL DISPOSABLE) ×4 IMPLANT
TRAY FOLEY MTR SLVR 16FR STAT (SET/KITS/TRAYS/PACK) ×4 IMPLANT
TROCAR XCEL BLADELESS 5X75MML (TROCAR) IMPLANT
TUBING EXTENTION W/L.L. (IV SETS) ×4 IMPLANT
WATER STERILE IRR 1000ML POUR (IV SOLUTION) ×4 IMPLANT

## 2019-02-28 NOTE — Anesthesia Procedure Notes (Addendum)
Procedure Name: Intubation Date/Time: 02/28/2019 8:14 AM Performed by: Lance Coon, CRNA Pre-anesthesia Checklist: Patient identified, Emergency Drugs available, Suction available and Patient being monitored Patient Re-evaluated:Patient Re-evaluated prior to induction Oxygen Delivery Method: Circle System Utilized Preoxygenation: Pre-oxygenation with 100% oxygen Induction Type: IV induction Laryngoscope Size: Glidescope and 4 Grade View: Grade I Tube type: Oral Endobronchial tube: Left, Double lumen EBT, EBT position confirmed by fiberoptic bronchoscope and EBT position confirmed by auscultation and 41 Fr Number of attempts: 3 Airway Equipment and Method: Stylet,  Video-laryngoscopy and Fiberoptic brochoscope (Vivasight DLT) Placement Confirmation: ETT inserted through vocal cords under direct vision,  positive ETCO2 and breath sounds checked- equal and bilateral Secured at: 29 cm Tube secured with: Tape Dental Injury: Teeth and Oropharynx as per pre-operative assessment  Comments: DL by SRNA Mac 4, g3v. DL by MD Mac4 g3v. Glidescope on and at bedside already with g1v

## 2019-02-28 NOTE — Transfer of Care (Signed)
Immediate Anesthesia Transfer of Care Note  Patient: Roger Mendoza.  Procedure(s) Performed: VIDEO BRONCHOSCOPY (N/A ) XI ROBOTIC ASSISTED THORASCOPY-RLL WEDGE,  and RIGHT LOWER LOBE LOBECTOMY (Right Chest) Intercostal Nerve Block (Right Chest) Node Dissection (Right Chest)  Patient Location: PACU  Anesthesia Type:General  Level of Consciousness: drowsy and patient cooperative  Airway & Oxygen Therapy: Patient Spontanous Breathing and Patient connected to face mask oxygen  Post-op Assessment: Report given to RN and Post -op Vital signs reviewed and stable  Post vital signs: Reviewed and stable  Last Vitals:  Vitals Value Taken Time  BP 116/63 02/28/19 1219  Temp    Pulse 85 02/28/19 1222  Resp 16 02/28/19 1222  SpO2 100 % 02/28/19 1222  Vitals shown include unvalidated device data.  Last Pain:  Vitals:   02/28/19 0616  TempSrc:   PainSc: 0-No pain         Complications: No apparent anesthesia complications

## 2019-02-28 NOTE — Discharge Instructions (Signed)
ACTIVITY:  1.Increase activity slowly. 2.Walk daily and increase frequency and duration as tolerates. 3.May walk up steps. 4.No lifting more than ten pounds for two weeks. 5.No driving for two weeks, as long as NOT taking narcotics. 6.Avoid straining. 7.STOP any activity that causes chest pain, shortness of breath, dizziness,sweating,     or excessive weakness. 8.Continue with breathing exercises daily.  DIET:  Heart healthy    WOUND:  1.May shower. 2.Clean wounds with mild soap and water.  Call the office at [phone] if any problems arise.

## 2019-02-28 NOTE — Anesthesia Procedure Notes (Signed)
Arterial Line Insertion Start/End2/04/2019 7:09 AM, 02/28/2019 7:19 AM Performed by: Duane Boston, MD, Lance Coon, CRNA, CRNA  Patient location: OR. Preanesthetic checklist: patient identified, IV checked, site marked, risks and benefits discussed, surgical consent, monitors and equipment checked, pre-op evaluation, timeout performed and anesthesia consent Lidocaine 1% used for infiltration Left, radial was placed Catheter size: 20 G Hand hygiene performed  and maximum sterile barriers used   Attempts: 1 Procedure performed without using ultrasound guided technique. Following insertion, dressing applied and Biopatch. Post procedure assessment: normal and unchanged  Patient tolerated the procedure well with no immediate complications. Additional procedure comments: Performed by Lyndle Herrlich.

## 2019-02-28 NOTE — Discharge Summary (Signed)
Physician Discharge Summary       [address]       [place] [address]             [phone]    Patient ID: Roger Mendoza. MRN: [id number] DOB/AGE: [birth year] 60 y.o.  Admit date: 02/28/2019 Discharge date: 03/02/2019  Admission Diagnoses:  Right lower lobe pulmonary nodule  Discharge Diagnoses:  1. Non small cancer RLL 2. S/p bronchoscopy, RLL wedge, RLL, LN sampling, and intercostal nerve block 3. History of sleep apnea-uses CPAP 4. History of tobacco abuse 5. History of recurrent UTI 6. History of obesity 7. History of hyperlipidemia 8. History of allergic rhinitis, seasonal 9. History of De Quervain's tenosynovitis, right 10. History of erectile dysfunction 11. History of epididymitis 12. History of MRSA on skin 13. History of left inguinal hernia  Consults: None  Procedure (s):  Bronchoscopy - Robotic assisted right video thoracoscopy - Right lower lobe wedge resection - Right lower lobectomy - Mediastinal lymph node sampling - Intercostal nerve block by Dr. Kipp Brood on 02/28/2019.  History of Presenting Illness: Roger Mendoza. 59 y.o. male referred for surgical evaluation of the right lower lobe 2 cm pulmonary nodule concerning for lung cancer. He is a former smoker quit approximately 40 years ago and underwent cross-sectional imaging which discovered this nodule. Respiratory standpoint, he only complains of minimal shortness of breath with exertion. He denies any chest pain. He states that he is out of shape and has gained weight over the last several months. He denies any neurologic symptoms.  This 59 year old male with a 2.4 cm right lower lobe pulmonary nodule with a cystic. PET CT showed that this nodule had an SUV max of 2.4. Given his smoking history, and characteristics of the mass concern for primary lung cancer. Dr. Kipp Brood recommended that he undergo bronchoscopy, robot assisted thoracoscopy, right lower lobe  wedge resection, possible right lower lobectomy. Risks and benefits discussed in detail, and he is agreeable to proceed with this plan. He will require stress test, and was tentatively scheduled for February 28, 2019.  Brief Hospital Course:  The patient remained afebrile and hemodynamically stable. A line and foley were removed early in the post operative course. Chest tube output gradually decreased and there was no air leak. Daily chest x rays were obtained and remained stable. Chest tube were removed on 02/**. Patient is ambulating on room air. Patient is tolerating a diet and has had a bowel movement. Wounds are clean and dry. Final chest X ray showed no pneumothorax. Patient is felt surgically stable for discharge today.   Latest Vital Signs: Blood pressure (!) 113/93, pulse 91, temperature 97.8 F (36.6 C), temperature source Oral, resp. rate 13, height 6\' 1"  (1.854 m), weight (!) 139.3 kg, SpO2 97 %.  Physical Exam: General appearance:alert, cooperative and no distress Neurologic:intact Heart:regular rate and rhythm Lungs:Breath sounds clear. CT has minimal drainage, no air leak. Wound:Dressings are dry.  Discharge Condition:Stable and discharged to home.  Recent laboratory studies:  Lab Results  Component Value Date   WBC 11.1 (H) 03/02/2019   HGB 13.0 03/02/2019   HCT 39.4 03/02/2019   MCV 90.8 03/02/2019   PLT 227 03/02/2019   Lab Results  Component Value Date   NA 138 03/02/2019   K 4.2 03/02/2019   CL 104 03/02/2019   CO2 26 03/02/2019   CREATININE 1.24 03/02/2019   GLUCOSE 122 (H) 03/02/2019      Diagnostic Studies: DG Chest 2  View  Result Date: 02/26/2019 CLINICAL DATA:  Shortness of breath. Right lung nodule. Pre-op respiratory exam. EXAM: CHEST - 2 VIEW COMPARISON:  03/11/2016, and recent PET-CT on 02/15/2019 FINDINGS: The heart size and mediastinal contours are within normal limits. Aortic atherosclerosis incidentally noted. Ill-defined nodular density  is seen in the lateral right lung base, corresponding to right lower lobe nodule seen on previous PET-CT. No other areas of pulmonary opacity are seen. No evidence of pleural effusion. IMPRESSION: Ill-defined right lower lung nodule, as seen on previous PET-CT. No acute findings. Electronically Signed   By: Marlaine Hind M.D.   On: 02/26/2019 20:43   NM PET - Initial Skull Base To Thigh  Result Date: 02/15/2019 CLINICAL DATA:  Initial treatment strategy for cavitary right lower lobe lung lesion. EXAM: NUCLEAR MEDICINE PET SKULL BASE TO THIGH TECHNIQUE: 15.4 mCi F-18 FDG was injected intravenously. Full-ring PET imaging was performed from the skull base to thigh after the radiotracer. CT data was obtained and used for attenuation correction and anatomic localization. Fasting blood glucose: 91 mg/dl COMPARISON:  Chest CT 01/21/2019. Abdominal CT 01/08/2019 and 11/07/2013. FINDINGS: Mediastinal blood pool activity: SUV max 3.1 NECK: No hypermetabolic cervical lymph nodes are identified.There are no lesions of the pharyngeal mucosal space. Focal activity at the larynx is likely physiologic. Incidental CT findings: none CHEST: There are no hypermetabolic mediastinal, hilar or axillary lymph nodes. The cavitary right lower lobe lung lesion is unchanged from the recent chest CT, measuring 2.2 x 1.9 cm on image 55/8. This has a solid component posteriorly which measures 13 x 8 mm. This lesion demonstrates only low level metabolic activity with an SUV max 2.4. No new or enlarging pulmonary nodules. Incidental CT findings: Aortic and coronary artery atherosclerosis. ABDOMEN/PELVIS: There is no hypermetabolic activity within the liver, adrenal glands, spleen or pancreas. There is no hypermetabolic nodal activity. Incidental CT findings: Hepatic steatosis and aortic and branch vessel atherosclerosis are again noted. The urinary bladder is incompletely distended. SKELETON: There is no hypermetabolic activity to suggest  osseous metastatic disease. Incidental CT findings: none IMPRESSION: 1. The cavitary right lower lobe lung lesion does not demonstrate significant hypermetabolic activity, although remains suspicious for bronchogenic carcinoma (likely adenocarcinoma) based on morphology. Tissue sampling should be considered. 2. No evidence of metastatic disease. 3. Stable incidental findings including aortic atherosclerosis and hepatic steatosis. Electronically Signed   By: Richardean Sale M.D.   On: 02/15/2019 16:23   DG CHEST PORT 1 VIEW  Result Date: 03/02/2019 CLINICAL DATA:  Status post right lower lobectomy. EXAM: PORTABLE CHEST 1 VIEW COMPARISON:  March 01, 2019. FINDINGS: Stable cardiomediastinal silhouette. Right-sided chest tube is unchanged in position. No pneumothorax is noted. Left lung is clear. Right internal jugular catheter is unchanged. Bony thorax is unremarkable. IMPRESSION: Stable position of right-sided chest tube without pneumothorax. Electronically Signed   By: Marijo Conception M.D.   On: 03/02/2019 09:47   DG CHEST PORT 1 VIEW  Result Date: 03/01/2019 CLINICAL DATA:  Status post chest tube placement. EXAM: PORTABLE CHEST 1 VIEW COMPARISON:  02/28/19 FINDINGS: There is a right IJ catheter with tip in the projection of the SVC. Right-sided chest tube is in place. This is stable from the preceding exam. No pneumothorax identified. No pleural effusion, edema or airspace consolidation. IMPRESSION: Stable position of right chest tube. No pneumothorax. Electronically Signed   By: Kerby Moors M.D.   On: 03/01/2019 08:37   DG Chest Port 1 View  Result Date: 02/28/2019 CLINICAL DATA:  Pneumothorax. EXAM: PORTABLE CHEST 1 VIEW COMPARISON:  February 26, 2019. FINDINGS: Stable cardiomediastinal silhouette. Right internal jugular catheter is noted with tip in expected position of the SVC. Right-sided chest tube is noted without definite pneumothorax. Mild bibasilar subsegmental atelectasis is noted. Bony thorax  is unremarkable. No significant pleural effusion is noted. IMPRESSION: Right-sided chest tube is noted without definite pneumothorax. Mild bibasilar subsegmental atelectasis. Electronically Signed   By: Marijo Conception M.D.   On: 02/28/2019 12:33   Myocardial Perfusion Imaging  Result Date: 02/28/2019  There was no ST segment deviation noted during stress.  The left ventricular ejection fraction is normal (55-65%).  Nuclear stress EF: 61%.  The study is normal.  This is a low risk study.  Fixed apical inferior perfusion defect with normal wall motion in that region, consistent with artifact     Discharge Medications: Allergies as of 03/02/2019   No Known Allergies     Medication List    TAKE these medications   Aspirin Low Dose 81 MG EC tablet Generic drug: aspirin TAKE 1 TABLET BY MOUTH EVERY DAY What changed: how much to take   Avanafil 200 MG Tabs Commonly known as: Stendra Take 1 tablet by mouth daily as needed.   cetirizine 10 MG tablet Commonly known as: ZYRTEC TAKE 1 TABLET BY MOUTH EVERY DAY   fluticasone 50 MCG/ACT nasal spray Commonly known as: FLONASE Place 2 sprays into both nostrils daily.   hydrocortisone 2.5 % rectal cream Commonly known as: Proctosol HC Place 1 application rectally 2 (two) times daily. What changed:   when to take this  reasons to take this   naproxen 500 MG tablet Commonly known as: NAPROSYN Take 1 tablet (500 mg total) by mouth 2 (two) times daily as needed.   NON FORMULARY C PAP   pravastatin 20 MG tablet Commonly known as: PRAVACHOL TAKE 1 TABLET BY MOUTH EVERY DAY   sildenafil 20 MG tablet Commonly known as: REVATIO 1-5 tablets (20 mg to 100 mg) prior to sexual activity daily prn   tiZANidine 2 MG tablet Commonly known as: ZANAFLEX TAKE 1-2 TABLETS (2-4 MG TOTAL) BY MOUTH AT BEDTIME AS NEEDED FOR MUSCLE SPASMS.   traMADol 50 MG tablet Commonly known as: ULTRAM Take 1-2 tablets (50-100 mg total) by mouth every 6  (six) hours as needed (mild pain).       Follow Up Appointments: Follow-up Information    Lajuana Matte, MD. Go on 03/06/2019.   Specialty: Cardiothoracic Surgery Why: Chest x-ray is to be taken at 1:30pm at [facility] which is on the first floor  of the same building as Dr. Abran Duke office. Appointment time with Dr. Kipp Brood is 2:00 pm Contact information: [address] [place] [address] [phone]           Signed: Joline Maxcy 03/02/2019, 12:13 PM

## 2019-02-28 NOTE — Anesthesia Procedure Notes (Signed)
Central Venous Catheter Insertion Performed by: Duane Boston, MD, anesthesiologist Start/End2/04/2019 7:09 AM, 02/28/2019 7:19 AM Patient location: Pre-op. Preanesthetic checklist: patient identified, IV checked, site marked, risks and benefits discussed, surgical consent, monitors and equipment checked, pre-op evaluation, timeout performed and anesthesia consent Position: Trendelenburg Lidocaine 1% used for infiltration and patient sedated Hand hygiene performed , maximum sterile barriers used  and Seldinger technique used Catheter size: 8 Fr Total catheter length 16. Central line was placed.Double lumen Procedure performed using ultrasound guided technique. Ultrasound Notes:image(s) printed for medical record Attempts: 1 Following insertion, dressing applied, line sutured and Biopatch. Post procedure assessment: blood return through all ports, free fluid flow and no air  Patient tolerated the procedure well with no immediate complications.

## 2019-02-28 NOTE — Brief Op Note (Signed)
02/28/2019  11:58 AM  PATIENT:  Roger Mendoza.  59 y.o. male  PRE-OPERATIVE DIAGNOSIS:  Right lower lobe pulmonary nodule  POST-OPERATIVE DIAGNOSIS:  Non small carcinoma RLL  PROCEDURE: VIDEO BRONCHOSCOPY, XI ROBOTIC ASSISTED THORASCOPY-RLL WEDGE,  RIGHT LOWER LOBE LOBECTOMY, Intercostal Nerve Block, and Node Dissection   SURGEON:  Surgeon(s) and Role:    Lightfoot, Lucile Crater, MD - Primary    Melrose Nakayama, MD - Assisting  PHYSICIAN ASSISTANT: Lars Pinks PA-C  ANESTHESIA:   general  EBL:  100 mL   BLOOD ADMINISTERED:none  DRAINS: 28 French chest tube placed in the right pleural space   LOCAL MEDICATIONS USED:  OTHER Exparel  SPECIMEN:  Source of Specimen:  Wedge RLL, RLL, multiple lymph nodes  DISPOSITION OF SPECIMEN:  PATHOLOGY  COUNTS CORRECT:  YES  DICTATION: .Dragon Dictation  PLAN OF CARE: Admit to inpatient   PATIENT DISPOSITION:  PACU - hemodynamically stable.   Delay start of Pharmacological VTE agent (>24hrs) due to surgical blood loss or risk of bleeding: yes

## 2019-02-28 NOTE — H&P (Signed)
[address]       [place] [address]             [phone]       No changes since his clinic appointment No chest pain with stress test, and nml EF OR today for bronchoscopy, Robotic assisted thoracoscopy, wedge resection of the right lower lobe pulmonary nodule, possible lobectomy.    Per my clinic note [name]  [facility] Record #[id number]  Date of Birth: April 20, 1960  Referring: Garner Nash, DO  Primary Care: [name], MD  Primary Cardiologist: No primary care provider on file.  Chief Complaint: No chief complaint on file.   History of Present Illness:  Roger Mendoza. 59 y.o. male referred for surgical evaluation of the right lower lobe 2 cm pulmonary nodule concerning for lung cancer. He is a former smoker quit approximately 40 years ago and underwent cross-sectional imaging which discovered this nodule. Respiratory standpoint, he only complains of minimal shortness of breath with exertion. He denies any chest pain. He states that he is out of shape and has gained weight over the last several months. He denies any neurologic symptoms.  Smoking Hx:  Quit smoking 4 yrs ago  Zubrod Score:  At the time of surgery this patient's most appropriate activity status/level should be described as:  ? 0 Normal activity, no symptoms  ? 1 Restricted in physical strenuous activity but ambulatory, able to do out light work  ? 2 Ambulatory and capable of self care, unable to do work activities, up and about >50 % of waking hours  ? 3 Only limited self care, in bed greater than 50% of waking hours  ? 4 Completely disabled, no self care, confined to bed or chair  ? 5 Moribund      Past Medical History:  Diagnosis Date  . Allergic rhinitis, seasonal   . Allergy    seasonal  . Colon polyp 02/2012   tubular adenoma, Dr. Zenovia Jarred  . De Quervain's tenosynovitis, right    hx/o  . Epididymitis    hx/o as adult  .  Erectile dysfunction   . Family history of premature CAD    father  . Hemorrhoids    hx of  . Hyperlipidemia   . Left inguinal hernia    tiny as of 03/2014  . MRSA (methicillin resistant staph aureus) culture positive    history of MRSA skin on pt's back/ unsure of date  . Normal cardiac stress test    remote past per patient, [place], [place]  . Obesity   . OSA on CPAP   . Recurrent UTI    Dr. Janice Norrie, [facility]  . Sleep apnea    uses c-pap  . Tinea pedis   . Tobacco use    4 cigarrettes daily  . Wears glasses         Past Surgical History:  Procedure Laterality Date  . ADENOIDECTOMY    . COLONOSCOPY  02/2012   Dr. Zenovia Jarred, repeat 02/2017  . ESOPHAGEAL DILATION    . ROTATOR CUFF REPAIR  1997   left side  . SKIN SURGERY     large area of MRSA on back/ over 10 years ago  . TONSILLECTOMY    . UPPER GASTROINTESTINAL ENDOSCOPY          Family History  Problem Relation Age of Onset  . Hypertension Mother   . Arthritis Mother   .  Hip dysplasia Mother   . Heart disease Mother   . Heart disease Father 57   died of MI  . Hyperlipidemia Father   . HIV Brother   . Colon cancer Neg Hx   . Esophageal cancer Neg Hx   . Rectal cancer Neg Hx   . Stomach cancer Neg Hx   . Cancer Neg Hx   . Diabetes Neg Hx   . Stroke Neg Hx    Social History       Tobacco Use  Smoking Status Former Smoker  . Packs/day: 0.25  . Years: 15.00  . Pack years: 3.75  . Types: Cigarettes  . Quit date: 03/31/2014  . Years since quitting: 4.9  Smokeless Tobacco Never Used   Social History       Substance and Sexual Activity  Alcohol Use Yes  . Alcohol/week: 15.0 standard drinks  . Types: 3 Glasses of wine, 12 Cans of beer per week   Comment: couple glasses of wine per day   No Known Allergies        Current Outpatient Medications  Medication Sig Dispense Refill  . ASPIRIN LOW DOSE 81 MG EC tablet TAKE 1 TABLET BY MOUTH EVERY DAY 90 tablet 3  . Avanafil (STENDRA) 200 MG TABS Take  1 tablet by mouth daily as needed. 10 tablet 3  . cetirizine (ZYRTEC) 10 MG tablet TAKE 1 TABLET BY MOUTH EVERY DAY 90 tablet 3  . fluticasone (FLONASE) 50 MCG/ACT nasal spray Place 2 sprays into both nostrils daily. 16 g 11  . hydrocortisone (PROCTOSOL HC) 2.5 % rectal cream Place 1 application rectally 2 (two) times daily. 28.35 g 0  . naproxen (NAPROSYN) 500 MG tablet Take 1 tablet (500 mg total) by mouth 2 (two) times daily as needed. 60 tablet 6  . NON FORMULARY C PAP    . pravastatin (PRAVACHOL) 20 MG tablet TAKE 1 TABLET BY MOUTH EVERY DAY 90 tablet 1  . sildenafil (REVATIO) 20 MG tablet 1-5 tablets (20 mg to 100 mg) prior to sexual activity daily prn 50 tablet 2  . tiZANidine (ZANAFLEX) 2 MG tablet TAKE 1-2 TABLETS (2-4 MG TOTAL) BY MOUTH AT BEDTIME AS NEEDED FOR MUSCLE SPASMS. 60 tablet 1            Current Facility-Administered Medications  Medication Dose Route Frequency Provider Last Rate Last Admin  . 0.9 % sodium chloride infusion 500 mL Intravenous Once Gatha Mayer, MD    . 0.9 % sodium chloride infusion 500 mL Intravenous Once Pyrtle, Lajuan Lines, MD     Review of Systems  Constitutional: Negative for fever, malaise/fatigue and weight loss.  HENT: Positive for nosebleeds.  Respiratory: Positive for shortness of breath. Negative for cough and sputum production.  Cardiovascular: Negative for chest pain and leg swelling.  Musculoskeletal: Negative.   PHYSICAL EXAMINATION:  There were no vitals taken for this visit.  Physical Exam  Constitutional: He is oriented to person, place, and time. He appears well-developed and well-nourished.  HENT:  Head: Normocephalic and atraumatic.  Eyes: EOM are normal. No scleral icterus.  Neck: No tracheal deviation present.  Cardiovascular: Normal rate.  Respiratory: Effort normal. No respiratory distress.  GI: Soft. He exhibits no distension.  Musculoskeletal:  General: Normal range of motion.  Cervical back: Normal range of motion.    Neurological: He is alert and oriented to person, place, and time.  Skin: Skin is warm and dry.   Diagnostic Studies & Laboratory data:  Recent  Radiology Findings:  Imaging Results    I have independently reviewed the above radiology studies and reviewed the findings with the patient.  Recent Lab Findings:  Recent Labs                                                                                                                                        PFTs:  - FVC: 83%  - FEV1: 83%  -DLCO: 123%  Assessment / Plan:  59 year old male with a 2.4 cm right lower lobe pulmonary nodule with a cystic. PET CT showed that this nodule had an SUV max of 2.4. Given his smoking history, and characteristics of the mass concern for primary lung cancer. I recommended that he undergo bronchoscopy, robot assisted thoracoscopy, right lower lobe wedge resection, possible right lower lobectomy. Risks and benefits discussed in detail, and he is agreeable to proceed with this plan. He will require stress test, and is tentatively scheduled for February 28, 2019    Lajuana Matte

## 2019-02-28 NOTE — Op Note (Signed)
      [address]       [place] [address]             [phone]        02/28/2019  Patient:  Roger Cal. Pre-Op Dx: Right lower lobe pulmonary nodule   Post-op Dx:  Right lower lobe NSCLC Procedure: - Bronchoscopy - Robotic assisted right video thoracoscopy - Right lower lobe wedge resection - Right lower lobectomy - Mediastinal lymph node sampling - Intercostal nerve block  Surgeon and Role:      * Davidjames Blansett, Lucile Crater, MD - Primary    * Dr. Roxan Hockey - assisting Assistant: Josie Saunders, PA-C  Anesthesia  general EBL:  100 ml Blood Administration: none Specimen:  Right lower lobe wedge, right lower lobe. Lymph node 7, 11, 12  Drains: 23 F argyle chest tube in right chest Counts: correct   Indications: Roger Cal. 59 y.o. male referred for surgical evaluation of the right lower lobe 2 cm pulmonary nodule concerning for lung cancer. He is a former smoker quit approximately 40 years ago and underwent cross-sectional imaging which discovered this nodule. Respiratory standpoint, he only complains of minimal shortness of breath with exertion. He denies any chest pain. He states that he is out of shape and has gained weight over the last several months. He denies any neurologic symptoms.  Findings: Right lower lobe wedge biopsy showed NSCLC.  Normal anatomy.    Operative Technique: After the risks, benefits and alternatives were thoroughly discussed, the patient was brought to the operative theatre.  Anesthesia was induced, and the bronchoscope was passed through the endotracheal tube.  All segmental bronchi were visualized.  The endotracheal tube was then exchanged for a double lumen tube.  The patient was then placed in a left lateral decubitus position and was prepped and draped in normal sterile fashion.  An appropriate surgical pause was performed, and pre-operative antibiotics were dosed accordingly.  We began by placing our  4 robotic ports in the the 7th intercostal space targeting the hilum of the lung.  A 31mm assistant port was placed in the 9th intercostal space in the anterior axillary line.  The robot was then docked and all instruments were passed under direct visualization.    The lung was then retracted superiorly, and the inferior pulmonary ligament was divided.  The hilum was mobilized anteriorly and posteriorly.  We identified the lower lobe pulmonary vein, and after careful isolation, it was divided with a vascular stapler.  We next moved to the  Pulmonary artery.  The artery was then divided with a vascular load stapler.  The bronchus to the right lower lobe was then isolated.  After a test clamp, with good ventilation of the upper and middle lobe, the bronchus was then divided.  The fissure was completed, and the specimen was passed into an endocatch bag.  It was removed from the superior access site.    Lymph nodes were then sampled at levels 7, 11, and 12.  The chest was irrigated, and an air leak test was performed.  An intercostal nerve block was performed under direct visualization.  A 1F chest with then placed, and we watch the remaining lobes re-expand.  The skin and soft tissue were closed with absorbable suture    The patient tolerated the procedure without any immediate complications, and was transferred to the PACU in stable condition.  Roger Mendoza Bary Leriche

## 2019-03-01 ENCOUNTER — Encounter (HOSPITAL_COMMUNITY): Payer: Self-pay | Admitting: Thoracic Surgery (Cardiothoracic Vascular Surgery)

## 2019-03-01 ENCOUNTER — Inpatient Hospital Stay (HOSPITAL_COMMUNITY): Payer: 59

## 2019-03-01 LAB — CBC
HCT: 41.7 % (ref 39.0–52.0)
Hemoglobin: 14 g/dL (ref 13.0–17.0)
MCH: 29.7 pg (ref 26.0–34.0)
MCHC: 33.6 g/dL (ref 30.0–36.0)
MCV: 88.5 fL (ref 80.0–100.0)
Platelets: 273 10*3/uL (ref 150–400)
RBC: 4.71 MIL/uL (ref 4.22–5.81)
RDW: 12.2 % (ref 11.5–15.5)
WBC: 15.4 10*3/uL — ABNORMAL HIGH (ref 4.0–10.5)
nRBC: 0 % (ref 0.0–0.2)

## 2019-03-01 LAB — SURGICAL PATHOLOGY

## 2019-03-01 LAB — GLUCOSE, CAPILLARY: Glucose-Capillary: 131 mg/dL — ABNORMAL HIGH (ref 70–99)

## 2019-03-01 LAB — BASIC METABOLIC PANEL
Anion gap: 10 (ref 5–15)
BUN: 17 mg/dL (ref 6–20)
CO2: 21 mmol/L — ABNORMAL LOW (ref 22–32)
Calcium: 8.8 mg/dL — ABNORMAL LOW (ref 8.9–10.3)
Chloride: 105 mmol/L (ref 98–111)
Creatinine, Ser: 1.19 mg/dL (ref 0.61–1.24)
GFR calc Af Amer: >60 mL/min (ref >60)
GFR calc non Af Amer: >60 mL/min (ref >60)
Glucose, Bld: 131 mg/dL — ABNORMAL HIGH (ref 70–99)
Potassium: 4.3 mmol/L (ref 3.5–5.1)
Sodium: 136 mmol/L (ref 135–145)

## 2019-03-01 NOTE — Plan of Care (Signed)

## 2019-03-01 NOTE — Progress Notes (Signed)
1 Day Post-Op Procedure(s) (LRB): VIDEO BRONCHOSCOPY (N/A) XI ROBOTIC ASSISTED THORASCOPY-RLL WEDGE,  and RIGHT LOWER LOBE LOBECTOMY (Right) Intercostal Nerve Block (Right) Node Dissection (Right) Subjective: Awake and alert, no complaints. Pain control adequate.   Objective: Vital signs in last 24 hours: Temp:  [97.3 F (36.3 C)-99 F (37.2 C)] 98.7 F (37.1 C) (02/05 1117) Pulse Rate:  [84-102] 93 (02/05 1117) Cardiac Rhythm: Normal sinus rhythm (02/05 0726) Resp:  [11-24] 20 (02/05 1117) BP: (116-157)/(60-83) 157/74 (02/05 1117) SpO2:  [92 %-99 %] 97 % (02/05 1117) Arterial Line BP: (115-179)/(56-77) 145/72 (02/04 1400)     Intake/Output from previous day: 02/04 0701 - 02/05 0700 In: 1398.7 [P.O.:460; I.V.:850; IV Piggyback:88.7] Out: 1930 [Urine:1580; Blood:100; Chest Tube:250] Intake/Output this shift: Total I/O In: -  Out: 10 [Chest Tube:10]  General appearance: alert, cooperative and no distress Neurologic: intact Heart: regular rate and rhythm Lungs: Breath sounds clear. CT has minimal drainage, no air leak. Wound: Dressings are dry.   Lab Results: Recent Labs    02/26/19 1331 03/01/19 0409  WBC 8.5 15.4*  HGB 14.0 14.0  HCT 41.6 41.7  PLT 251 273   BMET:  Recent Labs    02/26/19 1331 03/01/19 0409  NA 140 136  K 4.0 4.3  CL 108 105  CO2 21* 21*  GLUCOSE 91 131*  BUN 10 17  CREATININE 0.92 1.19  CALCIUM 9.1 8.8*    PT/INR:  Recent Labs    02/26/19 1331  LABPROT 13.0  INR 1.0   ABG    Component Value Date/Time   PHART 7.417 02/26/2019 1331   HCO3 23.5 02/26/2019 1331   ACIDBASEDEF 0.5 02/26/2019 1331   O2SAT 96.1 02/26/2019 1331   CBG (last 3)  Recent Labs    02/28/19 2326 03/01/19 0600  GLUCAP 127* 131*    Assessment/Plan: S/P Procedure(s) (LRB): VIDEO BRONCHOSCOPY (N/A) XI ROBOTIC ASSISTED THORASCOPY-RLL WEDGE,  and RIGHT LOWER LOBE LOBECTOMY (Right) Intercostal Nerve Block (Right) Node Dissection (Right)  -POD 1  robotic-assisted VATS, left lower lobectomy and lymph node sampling  for lung cancer. Stable respiratory status, good sats on RA. No air leak and CXR is stable. CT to water seal. Plan to remove the CT tomorrow if no change in CXR.   -DVT PPX-continue Lovenox.     LOS: 1 day    Antony Odea, PA-C [phone] 03/01/2019

## 2019-03-01 NOTE — Anesthesia Postprocedure Evaluation (Signed)
Anesthesia Post Note  Patient: Roger Mendoza.  Procedure(s) Performed: VIDEO BRONCHOSCOPY (N/A ) XI ROBOTIC ASSISTED THORASCOPY-RLL WEDGE,  and RIGHT LOWER LOBE LOBECTOMY (Right Chest) Intercostal Nerve Block (Right Chest) Node Dissection (Right Chest)     Patient location during evaluation: PACU Anesthesia Type: General Level of consciousness: sedated Pain management: pain level controlled Vital Signs Assessment: post-procedure vital signs reviewed and stable Respiratory status: spontaneous breathing and respiratory function stable Cardiovascular status: stable Postop Assessment: no apparent nausea or vomiting Anesthetic complications: no                  Ruari Duggan DANIEL

## 2019-03-02 ENCOUNTER — Inpatient Hospital Stay (HOSPITAL_COMMUNITY): Payer: 59

## 2019-03-02 LAB — CBC
HCT: 39.4 % (ref 39.0–52.0)
Hemoglobin: 13 g/dL (ref 13.0–17.0)
MCH: 30 pg (ref 26.0–34.0)
MCHC: 33 g/dL (ref 30.0–36.0)
MCV: 90.8 fL (ref 80.0–100.0)
Platelets: 227 10*3/uL (ref 150–400)
RBC: 4.34 MIL/uL (ref 4.22–5.81)
RDW: 12.2 % (ref 11.5–15.5)
WBC: 11.1 10*3/uL — ABNORMAL HIGH (ref 4.0–10.5)
nRBC: 0 % (ref 0.0–0.2)

## 2019-03-02 LAB — COMPREHENSIVE METABOLIC PANEL
ALT: 178 U/L — ABNORMAL HIGH (ref 0–44)
AST: 557 U/L — ABNORMAL HIGH (ref 15–41)
Albumin: 3.6 g/dL (ref 3.5–5.0)
Alkaline Phosphatase: 58 U/L (ref 38–126)
Anion gap: 8 (ref 5–15)
BUN: 20 mg/dL (ref 6–20)
CO2: 26 mmol/L (ref 22–32)
Calcium: 8.9 mg/dL (ref 8.9–10.3)
Chloride: 104 mmol/L (ref 98–111)
Creatinine, Ser: 1.24 mg/dL (ref 0.61–1.24)
GFR calc Af Amer: >60 mL/min (ref >60)
GFR calc non Af Amer: >60 mL/min (ref >60)
Glucose, Bld: 122 mg/dL — ABNORMAL HIGH (ref 70–99)
Potassium: 4.2 mmol/L (ref 3.5–5.1)
Sodium: 138 mmol/L (ref 135–145)
Total Bilirubin: 0.5 mg/dL (ref 0.3–1.2)
Total Protein: 6.2 g/dL — ABNORMAL LOW (ref 6.5–8.1)

## 2019-03-02 MED ORDER — TRAMADOL HCL 50 MG PO TABS: 50.0000 mg | ORAL_TABLET | Freq: Four times a day (QID) | ORAL | 0 refills | Status: DC | PRN

## 2019-03-02 NOTE — Progress Notes (Addendum)
Per order patient chest tube removed. Pt repositioned in bed. RN asked pt to turn to left side. Educated pt on the step of pulling chest tube. Sutures removed. Pt blew on thumb as if blowing up a balloon and chest tube pulled.  Suture tied. Site redressed with guaze and paper tape. Pt given educated about advise RN if feelin sob or have chest pain. Possible ozzing from site will redress as needed.  Patient tolerated well. RN will continue to monitor

## 2019-03-02 NOTE — Progress Notes (Addendum)
2 Days Post-Op Procedure(s) (LRB): VIDEO BRONCHOSCOPY (N/A) XI ROBOTIC ASSISTED THORASCOPY-RLL WEDGE,  and RIGHT LOWER LOBE LOBECTOMY (Right) Intercostal Nerve Block (Right) Node Dissection (Right) Subjective: Up in the bedside chair, reasonably comfortable. On RA without dyspnea.  Objective: Vital signs in last 24 hours: Temp:  [98.2 F (36.8 C)-99 F (37.2 C)] 98.2 F (36.8 C) (02/06 0322) Pulse Rate:  [84-97] 88 (02/06 0322) Cardiac Rhythm: Normal sinus rhythm (02/06 0701) Resp:  [11-20] 11 (02/06 0322) BP: (132-157)/(60-74) 142/72 (02/06 0322) SpO2:  [64 %-99 %] 99 % (02/06 0322)     Intake/Output from previous day: 02/05 0701 - 02/06 0700 In: 840 [P.O.:840] Out: 110 [Chest Tube:110] Intake/Output this shift: No intake/output data recorded.  Physical Exam: General appearance: alert, cooperative and no distress Neurologic: intact Heart: regular rate and rhythm Lungs: Breath sounds clear. CT has minimal drainage, no air leak. Wound: Dressings are dry.   Lab Results: Recent Labs    03/01/19 0409 03/02/19 0342  WBC 15.4* 11.1*  HGB 14.0 13.0  HCT 41.7 39.4  PLT 273 227   BMET:  Recent Labs    03/01/19 0409 03/02/19 0342  NA 136 138  K 4.3 4.2  CL 105 104  CO2 21* 26  GLUCOSE 131* 122*  BUN 17 20  CREATININE 1.19 1.24  CALCIUM 8.8* 8.9    PT/INR: No results for input(s): LABPROT, INR in the last 72 hours. ABG    Component Value Date/Time   PHART 7.417 02/26/2019 1331   HCO3 23.5 02/26/2019 1331   ACIDBASEDEF 0.5 02/26/2019 1331   O2SAT 96.1 02/26/2019 1331   CBG (last 3)  Recent Labs    02/28/19 2326 03/01/19 0600  GLUCAP 127* 131*    Assessment/Plan: S/P Procedure(s) (LRB): VIDEO BRONCHOSCOPY (N/A) XI ROBOTIC ASSISTED THORASCOPY-RLL WEDGE,  and RIGHT LOWER LOBE LOBECTOMY (Right) Intercostal Nerve Block (Right) Node Dissection (Right)  -POD 2 robotic-assisted VATS, left lower lobectomy and lymph node sampling  for lung cancer. Stable  respiratory status, good sats on RA. No air leak and CXR is stable.  Plan to remove the CT this morning, CXR later today and if stable will discharge to home.   -DVT PPX-continue Lovenox.    ADDENDUM: 12:04 Doing well since chest tube removed. CXR stable with full expansion of the right lung. Will discharge to home. Instructions given.   Antony Odea, PA-C [phone] 03/02/2019

## 2019-03-02 NOTE — Progress Notes (Signed)
Pt provided verbal discharge instructions. Wife at bedside during discharge. Paper copy of discharge given to patient. No further questions from patient or wife. VS stable at d/c.  IV, chest tube and  IJ removed per orders. Pt belongings sent with patient. Pt discharged by tech via wheelchair to private vehicle through N tower entrance.

## 2019-03-04 ENCOUNTER — Telehealth: Payer: Self-pay

## 2019-03-04 NOTE — Telephone Encounter (Signed)
Pt's wife calls office to report a trace amount of serosanguinous drainage from pt's chest tube incision site. States pt is afebrile and no s/s of infection noted. Informed her that a small amount of drainage is okay. Instructed to keep covered by gauze and to monitor amount; to call back if there is a significant increase in drainage or for s/s of infection. Wife is also concerned that Tramadol is not helping with his post-op pain. He is taking Naproxen 500 mg bid. Advised to try Tylenol in between doses of Naproxen and Tramadol. Advised to discuss concerns with Dr. Kipp Brood during [place] on Wednesday and to call office prn.

## 2019-03-06 ENCOUNTER — Other Ambulatory Visit: Payer: Self-pay

## 2019-03-06 ENCOUNTER — Encounter: Payer: Self-pay | Admitting: Thoracic Surgery (Cardiothoracic Vascular Surgery)

## 2019-03-06 ENCOUNTER — Ambulatory Visit (INDEPENDENT_AMBULATORY_CARE_PROVIDER_SITE_OTHER): Payer: Self-pay | Admitting: Thoracic Surgery (Cardiothoracic Vascular Surgery)

## 2019-03-06 VITALS — BP 126/75 | HR 100 | Temp 98.2°F | Resp 16 | Ht 73.0 in | Wt 300.0 lb

## 2019-03-06 DIAGNOSIS — Z902 Acquired absence of lung [part of]: Secondary | ICD-10-CM

## 2019-03-06 DIAGNOSIS — C3431 Malignant neoplasm of lower lobe, right bronchus or lung: Secondary | ICD-10-CM

## 2019-03-06 NOTE — Progress Notes (Signed)
      [address]       [place] [address]             [phone]        [name] [place] Record #[id number] Date of Birth: 1960/05/14  Referring: [name], MD Primary Care: [name], MD Primary Cardiologist:No primary care provider on file.  Reason for visit:   follow-up  History of Present Illness:     Mr. Digilio comes in for his 1st follow up.  He complains of hip pain, which is slowly getting better.  He denies any dyspnea  Physical Exam: BP 126/75 (BP Location: Left Arm, Patient Position: Sitting, Cuff Size: Large)   Pulse 100   Temp 98.2 F (36.8 C)   Resp 16   Ht 6\' 1"  (1.854 m)   Wt 300 lb (136.1 kg)   SpO2 98% Comment: RA  BMI 39.58 kg/m   Alert NAD Incision clean.  Abdomen soft, ND no peripheral edema   Diagnostic Studies & Laboratory data:  Path:  A. LUNG, RIGHT LOWER LOBE, WEDGE RESECTION:  - Adenocarcinoma, 1.4 cm.  - Margins not involved.  - Tumor focally involves subpleural connective tissue.   B. LYMPH NODE, STATION 12R, EXCISION:  - Anthracotic lymph node with no metastatic carcinoma.   C. LYMPH NODE, STATION 12R #2, EXCISION:  - Benign lung tissue.  - No lymph node or malignancy.   D. LYMPH NODE, STATION 12R #3, EXCISION:  - Benign lung tissue.  - No lymph node or malignancy.   E. LYMPH NODE, STATION 11R, EXCISION:  - Anthracotic lymph node with no metastatic carcinoma.   F. LYMPH NODE, STATION 12R #4, EXCISION:  - Anthracotic lymph node with no metastatic carcinoma.   G. LYMPH NODE, STATION 11R #2, EXCISION:  - Anthracotic lymph node with no metastatic carcinoma.   H. LUNG, RIGHT LOWER LOBE, LOBECTOMY:  - Lung lobe with focal hemorrhage consistent with previous wedge  excision.  - No residual carcinoma.  - Margins not involved.  - Three anthracotic lymph nodes with no metastatic carcinoma.   I. LYMPH NODE, STATION 7 #1, EXCISION:  - Anthracotic lymph node with no  metastatic carcinoma.    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

## 2019-03-07 ENCOUNTER — Encounter (HOSPITAL_COMMUNITY): Payer: Self-pay | Admitting: Internal Medicine

## 2019-03-07 ENCOUNTER — Other Ambulatory Visit: Payer: Self-pay | Admitting: *Deleted

## 2019-03-07 NOTE — Progress Notes (Signed)
The proposed treatment discussed in cancer conference 03/07/19 is for discussion purpose only and is not a binding recommendation.  The patient was not physically examined nor present for their treatment options.  Therefore, final treatment plans cannot be decided.  

## 2019-03-11 ENCOUNTER — Ambulatory Visit: Payer: 59 | Admitting: Thoracic Surgery (Cardiothoracic Vascular Surgery)

## 2019-03-12 ENCOUNTER — Encounter (HOSPITAL_COMMUNITY): Payer: Self-pay | Admitting: Internal Medicine

## 2019-04-03 ENCOUNTER — Other Ambulatory Visit: Payer: Self-pay | Admitting: Thoracic Surgery (Cardiothoracic Vascular Surgery)

## 2019-04-03 DIAGNOSIS — R911 Solitary pulmonary nodule: Secondary | ICD-10-CM

## 2019-04-05 ENCOUNTER — Encounter: Payer: Self-pay | Admitting: Thoracic Surgery (Cardiothoracic Vascular Surgery)

## 2019-04-05 ENCOUNTER — Ambulatory Visit (INDEPENDENT_AMBULATORY_CARE_PROVIDER_SITE_OTHER): Payer: Self-pay | Admitting: Thoracic Surgery (Cardiothoracic Vascular Surgery)

## 2019-04-05 ENCOUNTER — Ambulatory Visit
Admission: RE | Admit: 2019-04-05 | Discharge: 2019-04-05 | Disposition: A | Discharge status: Home / Self Care | Payer: 59 | Source: Ambulatory Visit | Attending: Thoracic Surgery (Cardiothoracic Vascular Surgery) | Admitting: Thoracic Surgery (Cardiothoracic Vascular Surgery)

## 2019-04-05 ENCOUNTER — Other Ambulatory Visit: Payer: Self-pay

## 2019-04-05 VITALS — BP 115/68 | HR 80 | Temp 97.7°F | Resp 20 | Ht 73.0 in | Wt 303.8 lb

## 2019-04-05 DIAGNOSIS — R911 Solitary pulmonary nodule: Secondary | ICD-10-CM

## 2019-04-05 DIAGNOSIS — C3431 Malignant neoplasm of lower lobe, right bronchus or lung: Secondary | ICD-10-CM

## 2019-04-05 NOTE — Progress Notes (Signed)
      [address]       [place] [address]             [phone]        Roger Mendoza Record #[id number] Date of Birth: 10-Mar-1960  Referring: Roger Ruddy, MD Primary Care: Roger Ruddy, MD Primary Cardiologist:No primary care provider on file.  Reason for visit:   follow-up  History of Present Illness:     Roger Mendoza comes back for his 2nd appointment.  His breathing is getting better.  He is able to ambulate for 30 min without difficulty.  He still has some pain and numbness along the extraction site.  Physical Exam: BP 115/68 (BP Location: Left Arm, Patient Position: Sitting, Cuff Size: Large)   Pulse 80   Temp 97.7 F (36.5 C) (Temporal)   Resp 20   Ht 6\' 1"  (1.854 m)   Wt (!) 303 lb 12.8 oz (137.8 kg)   SpO2 97% Comment: RA  BMI 40.08 kg/m   Alert NAD Incision clean, well healed. Abdomen soft, ND no peripheral edema   Diagnostic Studies & Laboratory data: CXR: clear  Path:  A. LUNG, RIGHT LOWER LOBE, WEDGE RESECTION:  - Adenocarcinoma, 1.4 cm.  - Margins not involved.  - Tumor focally involves subpleural connective tissue.   B. LYMPH NODE, STATION 12R, EXCISION:  - Anthracotic lymph node with no metastatic carcinoma.   C. LYMPH NODE, STATION 12R #2, EXCISION:  - Benign lung tissue.  - No lymph node or malignancy.   D. LYMPH NODE, STATION 12R #3, EXCISION:  - Benign lung tissue.  - No lymph node or malignancy.   E. LYMPH NODE, STATION 11R, EXCISION:  - Anthracotic lymph node with no metastatic carcinoma.   F. LYMPH NODE, STATION 12R #4, EXCISION:  - Anthracotic lymph node with no metastatic carcinoma.   G. LYMPH NODE, STATION 11R #2, EXCISION:  - Anthracotic lymph node with no metastatic carcinoma.   H. LUNG, RIGHT LOWER LOBE, LOBECTOMY:  - Lung lobe with focal hemorrhage consistent with previous wedge  excision.  - No residual carcinoma.  - Margins not involved.  - Three  anthracotic lymph nodes with no metastatic carcinoma.   I. LYMPH NODE, STATION 7 #1, EXCISION:  - Anthracotic lymph node with no metastatic carcinoma.    Assessment / Plan:   59 yo male with Stage 1A pT1bN0M0 adenocarcinoma of the right lower lobe Doing well Will continue see his PCP Will follow up with me in 5yr with a LD CT chest for surveillance   Roger Mendoza 04/05/2019 11:12 AM

## 2019-04-25 ENCOUNTER — Ambulatory Visit: Payer: 59 | Attending: Internal Medicine

## 2019-04-25 DIAGNOSIS — Z23 Encounter for immunization: Secondary | ICD-10-CM

## 2019-04-25 NOTE — Progress Notes (Signed)
   [id number]  Name:  Roger Mendoza.    MRN: [id number] DOB: 1960-05-17  04/25/2019  [name] was observed post Covid-19 immunization for 15 minutes without incident. He was provided with Vaccine Information Sheet and instruction to access the V-Safe system.   [name] was instructed to call 911 with any severe reactions post vaccine: Marland Kitchen Difficulty breathing  . Swelling of face and throat  . A fast heartbeat  . A bad rash all over body  . Dizziness and weakness   Immunizations Administered    Name Date Dose VIS Date Route   Pfizer COVID-19 Vaccine 04/25/2019  8:46 AM 0.3 mL 01/04/2019 Intramuscular   Manufacturer: [place]   Lot: [id number]   [place]: [address]-5

## 2019-05-18 ENCOUNTER — Other Ambulatory Visit: Payer: Self-pay | Admitting: Family Medicine

## 2019-05-20 ENCOUNTER — Ambulatory Visit: Payer: 59 | Attending: Internal Medicine

## 2019-05-20 DIAGNOSIS — Z23 Encounter for immunization: Secondary | ICD-10-CM

## 2019-05-20 NOTE — Progress Notes (Signed)
   [id number]  Name:  Ples Trudel.    MRN: [id number] DOB: August 02, 1960  05/20/2019  Mr. Hagy was observed post Covid-19 immunization for 15 minutes without incident. He was provided with Vaccine Information Sheet and instruction to access the V-Safe system.   Mr. Naves was instructed to call 911 with any severe reactions post vaccine: Marland Kitchen Difficulty breathing  . Swelling of face and throat  . A fast heartbeat  . A bad rash all over body  . Dizziness and weakness   Immunizations Administered    Name Date Dose VIS Date Route   Pfizer COVID-19 Vaccine 05/20/2019  8:27 AM 0.3 mL 03/20/2018 Intramuscular   Manufacturer: [place]   Lot: [id number]   [place]: [id number]

## 2019-08-19 ENCOUNTER — Ambulatory Visit (INDEPENDENT_AMBULATORY_CARE_PROVIDER_SITE_OTHER)
Admission: RE | Admit: 2019-08-19 | Discharge: 2019-08-19 | Disposition: A | Discharge status: Home / Self Care | Payer: 59 | Source: Ambulatory Visit | Attending: Family Medicine | Admitting: Family Medicine

## 2019-08-19 ENCOUNTER — Other Ambulatory Visit (INDEPENDENT_AMBULATORY_CARE_PROVIDER_SITE_OTHER): Payer: 59

## 2019-08-19 ENCOUNTER — Other Ambulatory Visit: Payer: Self-pay

## 2019-08-19 ENCOUNTER — Encounter: Payer: Self-pay | Admitting: Family Medicine

## 2019-08-19 ENCOUNTER — Ambulatory Visit (INDEPENDENT_AMBULATORY_CARE_PROVIDER_SITE_OTHER): Payer: 59 | Admitting: Family Medicine

## 2019-08-19 VITALS — BP 106/72 | HR 86 | Temp 98.6°F | Wt 298.0 lb

## 2019-08-19 DIAGNOSIS — E785 Hyperlipidemia, unspecified: Secondary | ICD-10-CM

## 2019-08-19 DIAGNOSIS — Z0000 Encounter for general adult medical examination without abnormal findings: Secondary | ICD-10-CM | POA: Diagnosis not present

## 2019-08-19 DIAGNOSIS — Z125 Encounter for screening for malignant neoplasm of prostate: Secondary | ICD-10-CM

## 2019-08-19 DIAGNOSIS — Z131 Encounter for screening for diabetes mellitus: Secondary | ICD-10-CM

## 2019-08-19 DIAGNOSIS — Z87891 Personal history of nicotine dependence: Secondary | ICD-10-CM

## 2019-08-19 DIAGNOSIS — R202 Paresthesia of skin: Secondary | ICD-10-CM | POA: Diagnosis not present

## 2019-08-19 DIAGNOSIS — M21942 Unspecified acquired deformity of hand, left hand: Secondary | ICD-10-CM

## 2019-08-19 DIAGNOSIS — J302 Other seasonal allergic rhinitis: Secondary | ICD-10-CM

## 2019-08-19 LAB — CBC WITH DIFFERENTIAL/PLATELET
Basophils Absolute: 0.1 10*3/uL (ref 0.0–0.1)
Basophils Relative: 0.5 % (ref 0.0–3.0)
Eosinophils Absolute: 0.2 10*3/uL (ref 0.0–0.7)
Eosinophils Relative: 2 % (ref 0.0–5.0)
HCT: 41.4 % (ref 39.0–52.0)
Hemoglobin: 13.8 g/dL (ref 13.0–17.0)
Lymphocytes Relative: 25.4 % (ref 12.0–46.0)
Lymphs Abs: 2.9 10*3/uL (ref 0.7–4.0)
MCHC: 33.3 g/dL (ref 30.0–36.0)
MCV: 90.4 fl (ref 78.0–100.0)
Monocytes Absolute: 0.9 10*3/uL (ref 0.1–1.0)
Monocytes Relative: 8.3 % (ref 3.0–12.0)
Neutro Abs: 7.2 10*3/uL (ref 1.4–7.7)
Neutrophils Relative %: 63.8 % (ref 43.0–77.0)
Platelets: 271 10*3/uL (ref 150.0–400.0)
RBC: 4.58 Mil/uL (ref 4.22–5.81)
RDW: 13.6 % (ref 11.5–15.5)
WBC: 11.3 10*3/uL — ABNORMAL HIGH (ref 4.0–10.5)

## 2019-08-19 LAB — COMPREHENSIVE METABOLIC PANEL
ALT: 45 U/L (ref 0–53)
AST: 51 U/L — ABNORMAL HIGH (ref 0–37)
Albumin: 4.9 g/dL (ref 3.5–5.2)
Alkaline Phosphatase: 83 U/L (ref 39–117)
BUN: 12 mg/dL (ref 6–23)
CO2: 28 mEq/L (ref 19–32)
Calcium: 9.6 mg/dL (ref 8.4–10.5)
Chloride: 106 mEq/L (ref 96–112)
Creatinine, Ser: 1.15 mg/dL (ref 0.40–1.50)
GFR: 78.56 mL/min (ref >60.00)
Glucose, Bld: 106 mg/dL — ABNORMAL HIGH (ref 70–99)
Potassium: 4.5 mEq/L (ref 3.5–5.1)
Sodium: 140 mEq/L (ref 135–145)
Total Bilirubin: 0.8 mg/dL (ref 0.2–1.2)
Total Protein: 7.4 g/dL (ref 6.0–8.3)

## 2019-08-19 LAB — LIPID PANEL
Cholesterol: 201 mg/dL — ABNORMAL HIGH (ref 0–200)
HDL: 34.6 mg/dL — ABNORMAL LOW (ref >39.00)
LDL Cholesterol: 138 mg/dL — ABNORMAL HIGH (ref 0–99)
NonHDL: 166.61
Total CHOL/HDL Ratio: 6
Triglycerides: 141 mg/dL (ref 0.0–149.0)
VLDL: 28.2 mg/dL (ref 0.0–40.0)

## 2019-08-19 LAB — TSH: TSH: 2.59 u[IU]/mL (ref 0.35–4.50)

## 2019-08-19 LAB — FOLATE: Folate: 16.1 ng/mL (ref >5.9)

## 2019-08-19 LAB — VITAMIN D 25 HYDROXY (VIT D DEFICIENCY, FRACTURES): VITD: 13.8 ng/mL — ABNORMAL LOW (ref 30.00–100.00)

## 2019-08-19 LAB — VITAMIN B12: Vitamin B-12: 334 pg/mL (ref 211–911)

## 2019-08-19 LAB — PSA: PSA: 2.08 ng/mL (ref 0.10–4.00)

## 2019-08-19 LAB — T4, FREE: Free T4: 0.84 ng/dL (ref 0.60–1.60)

## 2019-08-19 LAB — HEMOGLOBIN A1C: Hgb A1c MFr Bld: 4.9 % (ref 4.6–6.5)

## 2019-08-19 MED ORDER — LEVOCETIRIZINE DIHYDROCHLORIDE 5 MG PO TABS: 5.0000 mg | ORAL_TABLET | Freq: Every evening | ORAL | 5 refills | Status: DC

## 2019-08-19 NOTE — Patient Instructions (Signed)
Preventive Care 41-60 Years Old, Male Preventive care refers to lifestyle choices and visits with your health care provider that can promote health and wellness. This includes:  A yearly physical exam. This is also called an annual well check.  Regular dental and eye exams.  Immunizations.  Screening for certain conditions.  Healthy lifestyle choices, such as eating a healthy diet, getting regular exercise, not using drugs or products that contain nicotine and tobacco, and limiting alcohol use. What can I expect for my preventive care visit? Physical exam Your health care provider will check:  Height and weight. These may be used to calculate body mass index (BMI), which is a measurement that tells if you are at a healthy weight.  Heart rate and blood pressure.  Your skin for abnormal spots. Counseling Your health care provider may ask you questions about:  Alcohol, tobacco, and drug use.  Emotional well-being.  Home and relationship well-being.  Sexual activity.  Eating habits.  Work and work Statistician. What immunizations do I need?  Influenza (flu) vaccine  This is recommended every year. Tetanus, diphtheria, and pertussis (Tdap) vaccine  You may need a Td booster every 10 years. Varicella (chickenpox) vaccine  You may need this vaccine if you have not already been vaccinated. Zoster (shingles) vaccine  You may need this after age 64. Measles, mumps, and rubella (MMR) vaccine  You may need at least one dose of MMR if you were born in 1957 or later. You may also need a second dose. Pneumococcal conjugate (PCV13) vaccine  You may need this if you have certain conditions and were not previously vaccinated. Pneumococcal polysaccharide (PPSV23) vaccine  You may need one or two doses if you smoke cigarettes or if you have certain conditions. Meningococcal conjugate (MenACWY) vaccine  You may need this if you have certain conditions. Hepatitis A  vaccine  You may need this if you have certain conditions or if you travel or work in places where you may be exposed to hepatitis A. Hepatitis B vaccine  You may need this if you have certain conditions or if you travel or work in places where you may be exposed to hepatitis B. Haemophilus influenzae type b (Hib) vaccine  You may need this if you have certain risk factors. Human papillomavirus (HPV) vaccine  If recommended by your health care provider, you may need three doses over 6 months. You may receive vaccines as individual doses or as more than one vaccine together in one shot (combination vaccines). Talk with your health care provider about the risks and benefits of combination vaccines. What tests do I need? Blood tests  Lipid and cholesterol levels. These may be checked every 5 years, or more frequently if you are over 60 years old.  Hepatitis C test.  Hepatitis B test. Screening  Lung cancer screening. You may have this screening every year starting at age 43 if you have a 30-pack-year history of smoking and currently smoke or have quit within the past 15 years.  Prostate cancer screening. Recommendations will vary depending on your family history and other risks.  Colorectal cancer screening. All adults should have this screening starting at age 72 and continuing until age 2. Your health care provider may recommend screening at age 14 if you are at increased risk. You will have tests every 1-10 years, depending on your results and the type of screening test.  Diabetes screening. This is done by checking your blood sugar (glucose) after you have not eaten  for a while (fasting). You may have this done every 1-3 years.  Sexually transmitted disease (STD) testing. Follow these instructions at home: Eating and drinking  Eat a diet that includes fresh fruits and vegetables, whole grains, lean protein, and low-fat dairy products.  Take vitamin and mineral supplements as  recommended by your health care provider.  Do not drink alcohol if your health care provider tells you not to drink.  If you drink alcohol: ? Limit how much you have to 0-2 drinks a day. ? Be aware of how much alcohol is in your drink. In the [place] one drink equals one 12 oz bottle of beer (355 mL), one 5 oz glass of wine (148 mL), or one 1 oz glass of hard liquor (44 mL). Lifestyle  Take daily care of your teeth and gums.  Stay active. Exercise for at least 30 minutes on 5 or more days each week.  Do not use any products that contain nicotine or tobacco, such as cigarettes, e-cigarettes, and chewing tobacco. If you need help quitting, ask your health care provider.  If you are sexually active, practice safe sex. Use a condom or other form of protection to prevent STIs (sexually transmitted infections).  Talk with your health care provider about taking a low-dose aspirin every day starting at age 54. What's next?  Go to your health care provider once a year for a well check visit.  Ask your health care provider how often you should have your eyes and teeth checked.  Stay up to date on all vaccines. This information is not intended to replace advice given to you by your health care provider. Make sure you discuss any questions you have with your health care provider. Document Revised: 01/04/2018 Document Reviewed: 01/04/2018 Elsevier Patient Education  [place].  Paresthesia Paresthesia is an abnormal burning or prickling sensation. It is usually felt in the hands, arms, legs, or feet. However, it may occur in any part of the body. Usually, paresthesia is not painful. It may feel like:  Tingling or numbness.  Buzzing.  Itching. Paresthesia may occur without any clear cause, or it may be caused by:  Breathing too quickly (hyperventilation).  Pressure on a nerve.  An underlying medical condition.  Side effects of a medication.  Nutritional  deficiencies.  Exposure to toxic chemicals. Most people experience temporary (transient) paresthesia at some time in their lives. For some people, it may be long-lasting (chronic) because of an underlying medical condition. If you have paresthesia that lasts a long time, you may need to be evaluated by your health care provider. Follow these instructions at home: Alcohol use   Do not drink alcohol if: ? Your health care provider tells you not to drink. ? You are pregnant, may be pregnant, or are planning to become pregnant.  If you drink alcohol: ? Limit how much you use to:  0-1 drink a day for women.  0-2 drinks a day for men. ? Be aware of how much alcohol is in your drink. In the [place] one drink equals one 12 oz bottle of beer (355 mL), one 5 oz glass of wine (148 mL), or one 1 oz glass of hard liquor (44 mL). Nutrition   Eat a healthy diet. This includes: ? Eating foods that are high in fiber, such as fresh fruits and vegetables, whole grains, and beans. ? Limiting foods that are high in fat and processed sugars, such as fried or sweet foods. General instructions  Take over-the-counter and prescription medicines only as told by your health care provider.  Do not use any products that contain nicotine or tobacco, such as cigarettes and e-cigarettes. These can keep blood from reaching damaged nerves. If you need help quitting, ask your health care provider.  If you have diabetes, work closely with your health care provider to keep your blood sugar under control.  If you have numbness in your feet: ? Check every day for signs of injury or infection. Watch for redness, warmth, and swelling. ? Wear padded socks and comfortable shoes. These help protect your feet.  Keep all follow-up visits as told by your health care provider. This is important. Contact a health care provider if you:  Have paresthesia that gets worse or does not go away.  Have a burning or prickling feeling  that gets worse when you walk.  Have pain, cramps, or dizziness.  Develop a rash. Get help right away if you:  Feel weak.  Have trouble walking or moving.  Have problems with speech, understanding, or vision.  Feel confused.  Cannot control your bladder or bowel movements.  Have numbness after an injury.  Develop new weakness in an arm or leg.  Faint. Summary  Paresthesia is an abnormal burning or prickling sensation that is usually felt in the hands, arms, legs, or feet. It may also occur in other parts of the body.  Paresthesia may occur without any clear cause, or it may be caused by breathing too quickly (hyperventilation), pressure on a nerve, an underlying medical condition, side effects of a medication, nutritional deficiencies, or exposure to toxic chemicals.  If you have paresthesia that lasts a long time, you may need to be evaluated by your health care provider. This information is not intended to replace advice given to you by your health care provider. Make sure you discuss any questions you have with your health care provider. Document Revised: 02/05/2018 Document Reviewed: 01/19/2017 Elsevier Patient Education  [place].  Dyslipidemia Dyslipidemia is an imbalance of waxy, fat-like substances (lipids) in the blood. The body needs lipids in small amounts. Dyslipidemia often involves a high level of cholesterol or triglycerides, which are types of lipids. Common forms of dyslipidemia include:  High levels of LDL cholesterol. LDL is the type of cholesterol that causes fatty deposits (plaques) to build up in the blood vessels that carry blood away from your heart (arteries).  Low levels of HDL cholesterol. HDL cholesterol is the type of cholesterol that protects against heart disease. High levels of HDL remove the LDL buildup from arteries.  High levels of triglycerides. Triglycerides are a fatty substance in the blood that is linked to a buildup of  plaques in the arteries. What are the causes? Primary dyslipidemia is caused by changes (mutations) in genes that are passed down through families (inherited). These mutations cause several types of dyslipidemia. Secondary dyslipidemia is caused by lifestyle choices and diseases that lead to dyslipidemia, such as:  Eating a diet that is high in animal fat.  Not getting enough exercise.  Having diabetes, kidney disease, liver disease, or thyroid disease.  Drinking large amounts of alcohol.  Using certain medicines. What increases the risk? You are more likely to develop this condition if you are an older man or if you are a woman who has gone through menopause. Other risk factors include:  Having a family history of dyslipidemia.  Taking certain medicines, including birth control pills, steroids, some diuretics, and beta-blockers.  Smoking  cigarettes.  Eating a high-fat diet.  Having certain medical conditions such as diabetes, polycystic ovary syndrome (PCOS), kidney disease, liver disease, or hypothyroidism.  Not exercising regularly.  Being overweight or obese with too much belly fat. What are the signs or symptoms? In most cases, dyslipidemia does not usually cause any symptoms. In severe cases, very high lipid levels can cause:  Fatty bumps under the skin (xanthomas).  White or gray ring around the black center (pupil) of the eye. Very high triglyceride levels can cause inflammation of the pancreas (pancreatitis). How is this diagnosed? Your health care provider may diagnose dyslipidemia based on a routine blood test (fasting blood test). Because most people do not have symptoms of the condition, this blood testing (lipid profile) is done on adults age 38 and older and is repeated every 5 years. This test checks:  Total cholesterol. This measures the total amount of cholesterol in your blood, including LDL cholesterol, HDL cholesterol, and triglycerides. A healthy number  is below 200.  LDL cholesterol. The target number for LDL cholesterol is different for each person, depending on individual risk factors. Ask your health care provider what your LDL cholesterol should be.  HDL cholesterol. An HDL level of 60 or higher is best because it helps to protect against heart disease. A number below 40 for men or below 46 for women increases the risk for heart disease.  Triglycerides. A healthy triglyceride number is below 150. If your lipid profile is abnormal, your health care provider may do other blood tests. How is this treated? Treatment depends on the type of dyslipidemia that you have and your other risk factors for heart disease and stroke. Your health care provider will have a target range for your lipid levels based on this information. For many people, this condition may be treated by lifestyle changes, such as diet and exercise. Your health care provider may recommend that you:  Get regular exercise.  Make changes to your diet.  Quit smoking if you smoke. If diet changes and exercise do not help you reach your goals, your health care provider may also prescribe medicine to lower lipids. The most commonly prescribed type of medicine lowers your LDL cholesterol (statin drug). If you have a high triglyceride level, your provider may prescribe another type of drug (fibrate) or an omega-3 fish oil supplement, or both. Follow these instructions at home:  Eating and drinking  Follow instructions from your health care provider or dietitian about eating or drinking restrictions.  Eat a healthy diet as told by your health care provider. This can help you reach and maintain a healthy weight, lower your LDL cholesterol, and raise your HDL cholesterol. This may include: ? Limiting your calories, if you are overweight. ? Eating more fruits, vegetables, whole grains, fish, and lean meats. ? Limiting saturated fat, trans fat, and cholesterol.  If you drink  alcohol: ? Limit how much you use. ? Be aware of how much alcohol is in your drink. In the [place] one drink equals one 12 oz bottle of beer (355 mL), one 5 oz glass of wine (148 mL), or one 1 oz glass of hard liquor (44 mL).  Do not drink alcohol if: ? Your health care provider tells you not to drink. ? You are pregnant, may be pregnant, or are planning to become pregnant. Activity  Get regular exercise. Start an exercise and strength training program as told by your health care provider. Ask your health care provider what activities  are safe for you. Your health care provider may recommend: ? 30 minutes of aerobic activity 4-6 days a week. Brisk walking is an example of aerobic activity. ? Strength training 2 days a week. General instructions  Do not use any products that contain nicotine or tobacco, such as cigarettes, e-cigarettes, and chewing tobacco. If you need help quitting, ask your health care provider.  Take over-the-counter and prescription medicines only as told by your health care provider. This includes supplements.  Keep all follow-up visits as told by your health care provider. Contact a health care provider if:  You are: ? Having trouble sticking to your exercise or diet plan. ? Struggling to quit smoking or control your use of alcohol. Summary  Dyslipidemia often involves a high level of cholesterol or triglycerides, which are types of lipids.  Treatment depends on the type of dyslipidemia that you have and your other risk factors for heart disease and stroke.  For many people, treatment starts with lifestyle changes, such as diet and exercise.  Your health care provider may prescribe medicine to lower lipids. This information is not intended to replace advice given to you by your health care provider. Make sure you discuss any questions you have with your health care provider. Document Revised: 09/04/2017 Document Reviewed: 08/11/2017 Elsevier Patient Education   [place].

## 2019-08-19 NOTE — Progress Notes (Signed)
Subjective:     Roger Mendoza. is a 59 y.o. male with pmh sig for h/o stage IA [id number] adenocarcinoma of right lower lobe s/p resection, hot flashes, OSA, HLD, ED, who is here for a comprehensive physical exam. The patient reports problems - L pinky finger bent and numbness in left arm and leg.  Left fifth digit with slight bend.  Pt unable to fully straighten finger x a few weeks.  Unable to manually straighten finger with use of other hand.  Denies injury, pain, edema.  Previously had a knot on medial side of left fifth digit.  Pt also notes new numbness in left upper and lower extremity.  Does not last long occurs off and on times a few weeks.    Having allergy/sinus issues x 2 days.  Taking Zyrtec and Flonase daily however nose feels dry, has nasal congestion, and head pressure/pain on R side.  Pt notes his eyes look red each day.  Using CPAP nightly.  Pt a former smoker, quit in 2016.  Pt underwent video bronch and and subsequent lobectomy for a 1.4 cm adenocarcinoma in right lower lobe by Dr. Kipp Brood on 02/28/2019.   Since surgery pt states he has been eating better and trying to start working out.  Social History   Socioeconomic History  . Marital status: Married    Spouse name: Not on file  . Number of children: Not on file  . Years of education: Not on file  . Highest education level: Not on file  Occupational History  . Occupation: loads truck  Tobacco Use  . Smoking status: Former Smoker    Packs/day: 0.25    Years: 15.00    Pack years: 3.75    Types: Cigarettes    Quit date: 03/31/2014    Years since quitting: 5.3  . Smokeless tobacco: Never Used  Vaping Use  . Vaping Use: Never used  Substance and Sexual Activity  . Alcohol use: Yes    Alcohol/week: 15.0 standard drinks    Types: 3 Glasses of wine, 12 Cans of beer per week    Comment: couple glasses of wine per day  . Drug use: No  . Sexual activity: Not on file  Other Topics Concern  . Not on file  Social  History Narrative   Married, has 26yo son, works for [employer] at [employer], loading trucks, exercise - walks a lot at work, diet - has cut back on sweets, bread and greasy foods.  Plays golf, used to play softball.  Attends church, deacon.  04/2016   Social Determinants of Health   Financial Resource Strain:   . Difficulty of Paying Living Expenses:   Food Insecurity:   . Worried About Charity fundraiser in the Last Year:   . Arboriculturist in the Last Year:   Transportation Needs:   . Film/video editor (Medical):   Marland Kitchen Lack of Transportation (Non-Medical):   Physical Activity:   . Days of Exercise per Week:   . Minutes of Exercise per Session:   Stress:   . Feeling of Stress :   Social Connections:   . Frequency of Communication with Friends and Family:   . Frequency of Social Gatherings with Friends and Family:   . Attends Religious Services:   . Active Member of Clubs or Organizations:   . Attends Archivist Meetings:   Marland Kitchen Marital Status:   Intimate Partner Violence:   . Fear of Current or  Ex-Partner:   . Emotionally Abused:   Marland Kitchen Physically Abused:   . Sexually Abused:    Health Maintenance  Topic Date Due  . INFLUENZA VACCINE  08/25/2019  . COLONOSCOPY  07/04/2020  . TETANUS/TDAP  09/16/2020  . COVID-19 Vaccine  Completed  . Hepatitis C Screening  Completed  . HIV Screening  Completed    The following portions of the patient's history were reviewed and updated as appropriate: allergies, current medications, past family history, past medical history, past social history, past surgical history and problem list.  Review of Systems Pertinent items noted in HPI and remainder of comprehensive ROS otherwise negative.   Objective:    BP 106/72 (BP Location: Left Arm, Patient Position: Sitting, Cuff Size: Large)   Pulse 86   Temp 98.6 F (37 C)   Wt (!) 298 lb (135.2 kg)   SpO2 97%   BMI 39.32 kg/m  General appearance: alert, cooperative and no  distress Head: Normocephalic, without obvious abnormality, atraumatic Eyes: conjunctivae/corneas clear. PERRL, EOM's intact. Fundi benign. Ears: normal TM's and external ear canals both ears Nose: Nares normal. Septum midline. Mucosa normal. No drainage or sinus tenderness. Throat: lips, mucosa, and tongue normal; teeth and gums normal Neck: no adenopathy, no carotid bruit, no JVD, supple, symmetrical, trachea midline and thyroid not enlarged, symmetric, no tenderness/mass/nodules Back: symmetric, no curvature. ROM normal. No CVA tenderness. Lungs: clear to auscultation bilaterally Heart: regular rate and rhythm, S1, S2 normal, no murmur, click, rub or gallop Abdomen: soft, non-tender; bowel sounds normal; no masses,  no organomegaly Extremities: extremities normal, atraumatic, no cyanosis or edema fifth digit flexed with angulation medial at DIP joint.  Negative Tinel and Phalen's.  No symptoms with axial loading. Pulses: 2+ and symmetric Skin: Skin color, texture, turgor normal. No rashes or lesions well-healed surgical incisions of right flank with mild hypertrophy at incision line Lymph nodes: Cervical, supraclavicular, and axillary nodes normal. Neurologic: Alert and oriented X 3, normal strength and tone. Normal symmetric reflexes. Normal coordination and gait    Assessment:    Healthy male exam.      Plan:     Anticipatory guidance given including wearing seatbelts, smoke detectors in the home, increasing physical activity, increasing p.o. intake of water and vegetables. -We will obtain labs -Colonoscopy done 02/2012.  Due 2024 -Given handout -Next CPE in 1 year See After Visit Summary for Counseling Recommendations   Paresthesia - Plan: Hemoglobin A1c, CMP, Vitamin B12, Vitamin D, 25-hydroxy, Folate  Dyslipidemia -Discussed lifestyle modifications - Plan: Lipid panel  Former smoker  -Quit 4 years ago -s/o removal of 2 cm right lower lobe pulmonary nodule by Dr.  Kipp Brood, CT surgery - Plan: CBC with Differential/Platelet  Prostate cancer screening  - Plan: PSA  Screening for diabetes mellitus  - Plan: Hemoglobin A1c  Deformity of left hand  -Discussed possible causes including neuropathy/nerve damage, fracture - Plan: DG Hand Complete Left, Ambulatory referral to Hand Surgery  Seasonal allergies -We will try Xyzal -Continue saline nasal rinse -Will d/c Zyrtec and Flonase. -Consider Nasacort if needed for continued symptoms.   F/u prn  Grier Mitts, MD

## 2019-08-20 ENCOUNTER — Other Ambulatory Visit: Payer: Self-pay | Admitting: Family Medicine

## 2019-08-20 DIAGNOSIS — E559 Vitamin D deficiency, unspecified: Secondary | ICD-10-CM

## 2019-08-20 MED ORDER — VITAMIN D (ERGOCALCIFEROL) 1.25 MG (50000 UNIT) PO CAPS: 50000.0000 [IU] | ORAL_CAPSULE | ORAL | 0 refills | Status: DC

## 2019-08-21 ENCOUNTER — Encounter: Payer: Self-pay | Admitting: *Deleted

## 2019-11-07 ENCOUNTER — Other Ambulatory Visit: Payer: Self-pay | Admitting: Family Medicine

## 2019-11-07 DIAGNOSIS — E559 Vitamin D deficiency, unspecified: Secondary | ICD-10-CM

## 2020-01-10 ENCOUNTER — Ambulatory Visit: Payer: 59 | Attending: Internal Medicine

## 2020-01-10 DIAGNOSIS — Z23 Encounter for immunization: Secondary | ICD-10-CM

## 2020-01-10 NOTE — Progress Notes (Signed)
   [id number]  Name:  [name].    MRN: [id number] DOB: 09-21-1960  01/10/2020  Mr. Som was observed post Covid-19 immunization for 15 minutes without incident. He was provided with Vaccine Information Sheet and instruction to access the V-Safe system.   Mr. Alberg was instructed to call 911 with any severe reactions post vaccine: Marland Kitchen Difficulty breathing  . Swelling of face and throat  . A fast heartbeat  . A bad rash all over body  . Dizziness and weakness   Immunizations Administered    Name Date Dose VIS Date Route   Pfizer COVID-19 Vaccine 01/10/2020  5:25 PM 0.3 mL 11/13/2019 Intramuscular   Manufacturer: [place]   Lot: [id number]   [place]: [id number]

## 2020-02-27 ENCOUNTER — Other Ambulatory Visit: Payer: Self-pay | Admitting: Skilled Nursing Facility

## 2020-02-27 ENCOUNTER — Other Ambulatory Visit: Payer: Self-pay | Admitting: *Deleted

## 2020-02-27 DIAGNOSIS — Z85118 Personal history of other malignant neoplasm of bronchus and lung: Secondary | ICD-10-CM

## 2020-03-24 ENCOUNTER — Other Ambulatory Visit: Payer: Self-pay | Admitting: Family Medicine

## 2020-04-10 ENCOUNTER — Ambulatory Visit
Admission: RE | Admit: 2020-04-10 | Discharge: 2020-04-10 | Disposition: A | Discharge status: Home / Self Care | Payer: 59 | Source: Ambulatory Visit | Attending: Thoracic Surgery (Cardiothoracic Vascular Surgery) | Admitting: Thoracic Surgery (Cardiothoracic Vascular Surgery)

## 2020-04-10 ENCOUNTER — Encounter: Payer: Self-pay | Admitting: Thoracic Surgery (Cardiothoracic Vascular Surgery)

## 2020-04-10 ENCOUNTER — Other Ambulatory Visit: Payer: Self-pay

## 2020-04-10 ENCOUNTER — Ambulatory Visit (INDEPENDENT_AMBULATORY_CARE_PROVIDER_SITE_OTHER): Payer: 59 | Admitting: Thoracic Surgery (Cardiothoracic Vascular Surgery)

## 2020-04-10 VITALS — BP 136/85 | HR 89 | Resp 20 | Wt 313.0 lb

## 2020-04-10 DIAGNOSIS — Z85118 Personal history of other malignant neoplasm of bronchus and lung: Secondary | ICD-10-CM

## 2020-04-10 NOTE — Progress Notes (Signed)
[address]       ,[place] [address]             [id number]                    Roger Cal. [place] Record #[id number] Date of Birth: [birth year]  Referring: [name], MD Primary Care: [name], MD Primary Cardiologist: No primary care provider on file.  Chief Complaint:    Chief Complaint  Patient presents with  . Follow-up    Hx of adenocarcinoma, yearly surveillance with CT chest today    History of Present Illness:    Roger Cal. 60 y.o. male with a history of stage I aT1b N0 M0 adenocarcinoma of the right lower lobe presents for his 1 year follow-up.  Overall he is doing well.  He occasionally has some shortness of breath with exertion but attributes most of this to weight gain.  He also has some knotty pain at the access incision site.    Past Medical History:  Diagnosis Date  . Allergic rhinitis, seasonal   . Allergy    seasonal  . Colon polyp 02/2012   tubular adenoma, Dr. Zenovia Jarred  . De Quervain's tenosynovitis, right    hx/o  . Epididymitis    hx/o as adult  . Erectile dysfunction   . Family history of premature CAD    father  . Heart murmur   . Hemorrhoids    hx of  . Hyperlipidemia   . Left inguinal hernia    tiny as of 03/2014  . MRSA (methicillin resistant staph aureus) culture positive    history of MRSA skin on pt's back/ unsure of date  . Normal cardiac stress test    remote past per patient, [place], [place]  . Obesity   . OSA on CPAP   . Recurrent UTI    Dr. Janice Norrie, [facility]  . Sleep apnea    uses c-pap  . Tinea pedis   . Tobacco use    4 cigarrettes daily  . Wears glasses     Past Surgical History:  Procedure Laterality Date  . ADENOIDECTOMY    . CARDIAC CATHETERIZATION  02/11/2005  . COLONOSCOPY  02/2012   Dr. Zenovia Jarred, repeat 02/2017  . ESOPHAGEAL DILATION    . INTERCOSTAL NERVE BLOCK Right 02/28/2019   Procedure: Intercostal Nerve Block;   Surgeon: Lajuana Matte, MD;  Location: [facility];  Service: Thoracic;  Laterality: Right;  . NODE DISSECTION Right 02/28/2019   Procedure: Node Dissection;  Surgeon: Lajuana Matte, MD;  Location: [facility];  Service: Thoracic;  Laterality: Right;  . ROTATOR CUFF REPAIR  1997   left side  . SKIN SURGERY     large area of MRSA on back/ over 10 years ago  . TONSILLECTOMY    . UPPER GASTROINTESTINAL ENDOSCOPY    . VIDEO BRONCHOSCOPY N/A 02/28/2019   Procedure: VIDEO BRONCHOSCOPY;  Surgeon: Lajuana Matte, MD;  Location: [facility];  Service: Thoracic;  Laterality: N/A;    Family History  Problem Relation Age of Onset  . Hypertension Mother   . Arthritis Mother   . Hip dysplasia Mother   . Heart disease Mother   . Heart disease Father 55       died of MI  . Hyperlipidemia Father   . HIV Brother   . Colon cancer Neg Hx   . Esophageal  cancer Neg Hx   . Rectal cancer Neg Hx   . Stomach cancer Neg Hx   . Cancer Neg Hx   . Diabetes Neg Hx   . Stroke Neg Hx      Social History   Tobacco Use  Smoking Status Former Smoker  . Packs/day: 0.25  . Years: 15.00  . Pack years: 3.75  . Types: Cigarettes  . Quit date: 03/31/2014  . Years since quitting: 6.0  Smokeless Tobacco Never Used    Social History   Substance and Sexual Activity  Alcohol Use Yes  . Alcohol/week: 15.0 standard drinks  . Types: 3 Glasses of wine, 12 Cans of beer per week   Comment: couple glasses of wine per day     No Known Allergies  Current Outpatient Medications  Medication Sig Dispense Refill  . acetaminophen (TYLENOL) 325 MG tablet Take 650 mg by mouth every 6 (six) hours as needed.    . ASPIRIN LOW DOSE 81 MG EC tablet TAKE 1 TABLET BY MOUTH EVERY DAY 90 tablet 3  . cetirizine (ZYRTEC) 10 MG tablet TAKE 1 TABLET BY MOUTH EVERY DAY (Patient taking differently: Take 10 mg by mouth daily.) 90 tablet 3  . fluticasone (FLONASE) 50 MCG/ACT nasal spray Place 2 sprays into both nostrils daily. 16 g 11   . hydrocortisone (PROCTOSOL HC) 2.5 % rectal cream Place 1 application rectally 2 (two) times daily. (Patient taking differently: Place 1 application rectally 2 (two) times daily as needed for hemorrhoids.) 28.35 g 0  . levocetirizine (XYZAL) 5 MG tablet Take 1 tablet (5 mg total) by mouth every evening. 30 tablet 5  . naproxen (NAPROSYN) 500 MG tablet Take 1 tablet (500 mg total) by mouth 2 (two) times daily as needed. 60 tablet 6  . NON FORMULARY C PAP    . sildenafil (REVATIO) 20 MG tablet 1-5 tablets (20 mg to 100 mg) prior to sexual activity daily prn 50 tablet 2  . tiZANidine (ZANAFLEX) 2 MG tablet TAKE 1-2 TABLETS (2-4 MG TOTAL) BY MOUTH AT BEDTIME AS NEEDED FOR MUSCLE SPASMS. 60 tablet 1  . traMADol (ULTRAM) 50 MG tablet Take 1-2 tablets (50-100 mg total) by mouth every 6 (six) hours as needed (mild pain). 30 tablet 0  . Vitamin D, Ergocalciferol, (DRISDOL) 1.25 MG (50000 UNIT) CAPS capsule Take 1 capsule (50,000 Units total) by mouth every 7 (seven) days. 12 capsule 0   No current facility-administered medications for this visit.    Review of Systems  Constitutional: Negative.   Respiratory: Positive for shortness of breath.   Cardiovascular: Negative.   Musculoskeletal: Positive for myalgias.  Neurological: Negative.     PHYSICAL EXAMINATION: BP 136/85 (BP Location: Left Arm, Patient Position: Sitting)   Pulse 89   Resp 20   Wt (!) 313 lb (142 kg)   SpO2 93% Comment: RA  BMI 41.30 kg/m   Physical Exam Constitutional:      General: He is not in acute distress.    Appearance: Normal appearance. He is obese. He is not ill-appearing.  HENT:     Head: Normocephalic and atraumatic.  Eyes:     Extraocular Movements: Extraocular movements intact.  Cardiovascular:     Rate and Rhythm: Normal rate.  Pulmonary:     Effort: Pulmonary effort is normal. No respiratory distress.  Musculoskeletal:        General: Normal range of motion.  Skin:    General: Skin is warm and dry.   Neurological:  General: No focal deficit present.     Mental Status: He is alert and oriented to person, place, and time.      Diagnostic Studies & Laboratory data:     Recent Radiology Findings:   No results found.     I have independently reviewed the above radiology studies  and reviewed the findings with the patient.   Recent Lab Findings: Lab Results  Component Value Date   WBC 11.3 (H) 08/19/2019   HGB 13.8 08/19/2019   HCT 41.4 08/19/2019   PLT 271.0 08/19/2019   GLUCOSE 106 (H) 08/19/2019   CHOL 201 (H) 08/19/2019   TRIG 141.0 08/19/2019   HDL 34.60 (L) 08/19/2019   LDLDIRECT 142 (H) 01/04/2012   LDLCALC 138 (H) 08/19/2019   ALT 45 08/19/2019   AST 51 (H) 08/19/2019   NA 140 08/19/2019   K 4.5 08/19/2019   CL 106 08/19/2019   CREATININE 1.15 08/19/2019   BUN 12 08/19/2019   CO2 28 08/19/2019   TSH 2.59 08/19/2019   INR 1.0 02/26/2019   HGBA1C 4.9 08/19/2019        Assessment / Plan:   60 year old male with history of stage Ia adenocarcinoma of the right lower lobe.  I personally reviewed his CT scan, final impressions are pending.  On my read there does not appear to be any new lesions of concern.  He will follow-up in 1 year for ongoing surveillance.      Lajuana Matte 04/10/2020 12:48 PM

## 2020-04-27 ENCOUNTER — Ambulatory Visit: Payer: 59 | Admitting: Family Medicine

## 2020-04-27 ENCOUNTER — Other Ambulatory Visit: Payer: Self-pay

## 2020-04-27 ENCOUNTER — Encounter: Payer: Self-pay | Admitting: Family Medicine

## 2020-04-27 VITALS — BP 120/82 | HR 81 | Temp 99.2°F | Wt 315.6 lb

## 2020-04-27 DIAGNOSIS — R200 Anesthesia of skin: Secondary | ICD-10-CM

## 2020-04-27 DIAGNOSIS — R202 Paresthesia of skin: Secondary | ICD-10-CM

## 2020-04-27 DIAGNOSIS — M65312 Trigger thumb, left thumb: Secondary | ICD-10-CM | POA: Diagnosis not present

## 2020-04-27 DIAGNOSIS — M25532 Pain in left wrist: Secondary | ICD-10-CM

## 2020-04-27 DIAGNOSIS — G8929 Other chronic pain: Secondary | ICD-10-CM

## 2020-04-27 DIAGNOSIS — M79645 Pain in left finger(s): Secondary | ICD-10-CM | POA: Diagnosis not present

## 2020-04-27 LAB — CBC WITH DIFFERENTIAL/PLATELET
Basophils Absolute: 0 10*3/uL (ref 0.0–0.1)
Basophils Relative: 0.3 % (ref 0.0–3.0)
Eosinophils Absolute: 0.3 10*3/uL (ref 0.0–0.7)
Eosinophils Relative: 3.1 % (ref 0.0–5.0)
HCT: 39.7 % (ref 39.0–52.0)
Hemoglobin: 13.5 g/dL (ref 13.0–17.0)
Lymphocytes Relative: 28.9 % (ref 12.0–46.0)
Lymphs Abs: 2.5 10*3/uL (ref 0.7–4.0)
MCHC: 33.9 g/dL (ref 30.0–36.0)
MCV: 88.3 fl (ref 78.0–100.0)
Monocytes Absolute: 0.6 10*3/uL (ref 0.1–1.0)
Monocytes Relative: 6.9 % (ref 3.0–12.0)
Neutro Abs: 5.2 10*3/uL (ref 1.4–7.7)
Neutrophils Relative %: 60.8 % (ref 43.0–77.0)
Platelets: 258 10*3/uL (ref 150.0–400.0)
RBC: 4.49 Mil/uL (ref 4.22–5.81)
RDW: 13.2 % (ref 11.5–15.5)
WBC: 8.6 10*3/uL (ref 4.0–10.5)

## 2020-04-27 LAB — BASIC METABOLIC PANEL
BUN: 11 mg/dL (ref 6–23)
CO2: 28 mEq/L (ref 19–32)
Calcium: 9.5 mg/dL (ref 8.4–10.5)
Chloride: 104 mEq/L (ref 96–112)
Creatinine, Ser: 0.91 mg/dL (ref 0.40–1.50)
GFR: 91.65 mL/min (ref >60.00)
Glucose, Bld: 110 mg/dL — ABNORMAL HIGH (ref 70–99)
Potassium: 3.9 mEq/L (ref 3.5–5.1)
Sodium: 140 mEq/L (ref 135–145)

## 2020-04-27 LAB — VITAMIN B12: Vitamin B-12: 653 pg/mL (ref 211–911)

## 2020-04-27 LAB — URIC ACID: Uric Acid, Serum: 5.7 mg/dL (ref 4.0–7.8)

## 2020-04-27 LAB — FOLATE: Folate: 10.8 ng/mL (ref >5.9)

## 2020-04-27 MED ORDER — PREDNISONE 10 MG PO TABS: ORAL_TABLET | ORAL | 0 refills | Status: DC

## 2020-04-27 MED ORDER — MELOXICAM 7.5 MG PO TABS: 7.5000 mg | ORAL_TABLET | Freq: Every day | ORAL | 0 refills | Status: DC | PRN

## 2020-04-27 NOTE — Patient Instructions (Signed)
Trigger Finger  Trigger finger, also called stenosing tenosynovitis,  is a condition that causes a finger to get stuck in a bent position. Each finger has a tendon, which is a tough, cord-like tissue that connects muscle to bone, and each tendon passes through a tunnel of tissue called a tendon sheath. To move your finger, your tendon needs to glide freely through the sheath. Trigger finger happens when the tendon or the sheath thickens, making it difficult to move your finger. Trigger finger can affect any finger or a thumb. It may affect more than one finger. Mild cases may clear up with rest and medicine. Severe cases require more treatment. What are the causes? Trigger finger is caused by a thickened finger tendon or tendon sheath. The cause of this thickening is not known. What increases the risk? The following factors may make you more likely to develop this condition:  Doing activities that require a strong grip.  Having rheumatoid arthritis, gout, or diabetes.  Being 58-108 years old.  Being male. What are the signs or symptoms? Symptoms of this condition include:  Pain when bending or straightening your finger.  Tenderness or swelling where your finger attaches to the palm of your hand.  A lump in the palm of your hand or on the inside of your finger.  Hearing a noise like a pop or a snap when you try to straighten your finger.  Feeling a catching or locking sensation when you try to straighten your finger.  Being unable to straighten your finger. How is this diagnosed? This condition is diagnosed based on your symptoms and a physical exam. How is this treated? This condition may be treated by:  Resting your finger and avoiding activities that make symptoms worse.  Wearing a finger splint to keep your finger extended.  Taking NSAIDs, such as ibuprofen, to relieve pain and swelling.  Doing gentle exercises to stretch the finger as told by your health care  provider.  Having medicine that reduces swelling and inflammation (steroids) injected into the tendon sheath. Injections may need to be repeated.  Having surgery to open the tendon sheath. This may be done if other treatments do not work and you cannot straighten your finger. You may need physical therapy after surgery. Follow these instructions at home: If you have a splint:  Wear the splint as told by your health care provider. Remove it only as told by your health care provider.  Loosen it if your fingers tingle, become numb, or turn cold and blue.  Keep it clean.  If the splint is not waterproof: ? Do not let it get wet. ? Cover it with a watertight covering when you take a bath or shower. Managing pain, stiffness, and swelling If directed, apply heat to the affected area as often as told by your health care provider. Use the heat source that your health care provider recommends, such as a moist heat pack or a heating pad.  Place a towel between your skin and the heat source.  Leave the heat on for 20-30 minutes.  Remove the heat if your skin turns bright red. This is especially important if you are unable to feel pain, heat, or cold. You may have a greater risk of getting burned. If directed, put ice on the painful area. To do this:  If you have a removable splint, remove it as told by your health care provider.  Put ice in a plastic bag.  Place a towel between your skin  and the bag or between your splint and the bag.  Leave the ice on for 20 minutes, 2-3 times a day.      Activity  Rest your finger as told by your health care provider. Avoid activities that make the pain worse.  Return to your normal activities as told by your health care provider. Ask your health care provider what activities are safe for you.  Do exercises as told by your health care provider.  Ask your health care provider when it is safe to drive if you have a splint on your hand. General  instructions  Take over-the-counter and prescription medicines only as told by your health care provider.  Keep all follow-up visits as told by your health care provider. This is important. Contact a health care provider if:  Your symptoms are not improving with home care. Summary  Trigger finger, also called stenosing tenosynovitis, causes your finger to get stuck in a bent position. This can make it difficult and painful to straighten your finger.  This condition develops when a finger tendon or tendon sheath thickens.  Treatment may include resting your finger, wearing a splint, and taking medicines.  In severe cases, surgery to open the tendon sheath may be needed. This information is not intended to replace advice given to you by your health care provider. Make sure you discuss any questions you have with your health care provider. Document Revised: 05/28/2018 Document Reviewed: 05/28/2018 Elsevier Patient Education  2021 [place].  Wrist Pain, Adult There are many things that can cause wrist pain. Some common causes include:  An injury to the wrist area, such as a sprain, strain, or fracture.  Overuse of the joint.  A condition that causes increased pressure on a nerve in the wrist (carpal tunnel syndrome).  Wear and tear of the joints that occurs with aging (osteoarthritis).  Other types of joint inflammation and stiffness (arthritis). Sometimes, the cause of wrist pain is not known. Often, the pain goes away when you follow instructions from your health care provider for relieving pain at home, such as resting the wrist, icing the wrist, or using a splint or an elastic wrap for a short time. If your wrist pain continues, it is important to tell your health care provider. Follow these instructions at home: If you have a splint or elastic wrap:  Wear the splint or wrap as told by your health care provider. Remove it only as told by your health care provider. Ask your  health care provider if you may remove it for bathing.  Loosen the splint or wrap if your fingers tingle, become numb, or turn cold and blue.  Check the skin around the splint or wrap every day. Tell your health care provider about any concerns.  Keep the splint or wrap clean.  If the splint or wrap is not waterproof: ? Do not let it get wet. ? Cover it with a watertight covering when you take a bath or shower. Managing pain, stiffness, and swelling  If directed, put ice on the painful area. To do this: ? If you have a removable splint or wrap, remove it as told by your health care provider. ? Put ice in a plastic bag. ? Place a towel between your skin and the bag or between your splint or wrap and the bag. ? Leave the ice on for 20 minutes, 2-3 times a day.  Move your fingers often to reduce stiffness and swelling.  Raise (elevate) the  injured area above the level of your heart while you are sitting or lying down.   Activity  Rest your affected wrist as told by your health care provider.  Return to your normal activities as told by your health care provider. Ask your health care provider what activities are safe for you.  Ask your health care provider when it is safe to drive if you have a splint or wrap on your wrist.  Do exercises as told by your health care provider. General instructions  Pay attention to any changes in your symptoms.  Take over-the-counter and prescription medicines only as told by your health care provider.  Keep all follow-up visits as told by your health care provider. This is important. Contact a health care provider if:  You have a sudden, sharp pain in the wrist, hand, or arm that is different or new.  The swelling or bruising on your wrist or hand gets worse.  Your skin becomes red, gets a rash, or has open sores.  Your pain does not get better or it gets worse.  You have a fever or chills. Get help right away if:  You lose feeling in  your fingers or hand.  Your fingers turn white, very red, or cold and blue.  You cannot move your fingers. Summary  Wrist pain in an adult has many different causes.  If your wrist pain continues, it is important to tell your health care provider.  You may need to wear a splint or an elastic wrap for a short period of time.  Return to your normal activities as told by your health care provider. Ask your health care provider what activities are safe for you. This information is not intended to replace advice given to you by your health care provider. Make sure you discuss any questions you have with your health care provider. Document Revised: 11/29/2018 Document Reviewed: 11/29/2018 Elsevier Patient Education  2021 [place].  Gout  Gout is painful swelling of your joints. Gout is a type of arthritis. It is caused by having too much uric acid in your body. Uric acid is a chemical that is made when your body breaks down substances called purines. If your body has too much uric acid, sharp crystals can form and build up in your joints. This causes pain and swelling. Gout attacks can happen quickly and be very painful (acute gout). Over time, the attacks can affect more joints and happen more often (chronic gout). What are the causes?  Too much uric acid in your blood. This can happen because: ? Your kidneys do not remove enough uric acid from your blood. ? Your body makes too much uric acid. ? You eat too many foods that are high in purines. These foods include organ meats, some seafood, and beer.  Trauma or stress. What increases the risk?  Having a family history of gout.  Being male and middle-aged.  Being male and having gone through menopause.  Being very overweight (obese).  Drinking alcohol, especially beer.  Not having enough water in the body (being dehydrated).  Losing weight too quickly.  Having an organ transplant.  Having lead poisoning.  Taking  certain medicines.  Having kidney disease.  Having a skin condition called psoriasis. What are the signs or symptoms? An attack of acute gout usually happens in just one joint. The most common place is the big toe. Attacks often start at night. Other joints that may be affected include joints of the  feet, ankle, knee, fingers, wrist, or elbow. Symptoms of an attack may include:  Very bad pain.  Warmth.  Swelling.  Stiffness.  Shiny, red, or purple skin.  Tenderness. The affected joint may be very painful to touch.  Chills and fever. Chronic gout may cause symptoms more often. More joints may be involved. You may also have white or yellow lumps (tophi) on your hands or feet or in other areas near your joints.   How is this treated?  Treatment for this condition has two phases: treating an acute attack and preventing future attacks.  Acute gout treatment may include: ? NSAIDs. ? Steroids. These are taken by mouth or injected into a joint. ? Colchicine. This medicine relieves pain and swelling. It can be given by mouth or through an IV tube.  Preventive treatment may include: ? Taking small doses of NSAIDs or colchicine daily. ? Using a medicine that reduces uric acid levels in your blood. ? Making changes to your diet. You may need to see a food expert (dietitian) about what to eat and drink to prevent gout. Follow these instructions at home: During a gout attack  If told, put ice on the painful area: ? Put ice in a plastic bag. ? Place a towel between your skin and the bag. ? Leave the ice on for 20 minutes, 2-3 times a day.  Raise (elevate) the painful joint above the level of your heart as often as you can.  Rest the joint as much as possible. If the joint is in your leg, you may be given crutches.  Follow instructions from your doctor about what you cannot eat or drink.   Avoiding future gout attacks  Eat a low-purine diet. Avoid foods and drinks such  as: ? Liver. ? Kidney. ? Anchovies. ? Asparagus. ? Herring. ? Mushrooms. ? Mussels. ? Beer.  Stay at a healthy weight. If you want to lose weight, talk with your doctor. Do not lose weight too fast.  Start or continue an exercise plan as told by your doctor. Eating and drinking  Drink enough fluids to keep your pee (urine) pale yellow.  If you drink alcohol: ? Limit how much you use to:  0-1 drink a day for women.  0-2 drinks a day for men. ? Be aware of how much alcohol is in your drink. In the [place] one drink equals one 12 oz bottle of beer (355 mL), one 5 oz glass of wine (148 mL), or one 1 oz glass of hard liquor (44 mL). General instructions  Take over-the-counter and prescription medicines only as told by your doctor.  Do not drive or use heavy machinery while taking prescription pain medicine.  Return to your normal activities as told by your doctor. Ask your doctor what activities are safe for you.  Keep all follow-up visits as told by your doctor. This is important. Contact a doctor if:  You have another gout attack.  You still have symptoms of a gout attack after 10 days of treatment.  You have problems (side effects) because of your medicines.  You have chills or a fever.  You have burning pain when you pee (urinate).  You have pain in your lower back or belly. Get help right away if:  You have very bad pain.  Your pain cannot be controlled.  You cannot pee. Summary  Gout is painful swelling of the joints.  The most common site of pain is the big toe, but it can  affect other joints.  Medicines and avoiding some foods can help to prevent and treat gout attacks. This information is not intended to replace advice given to you by your health care provider. Make sure you discuss any questions you have with your health care provider. Document Revised: 08/02/2017 Document Reviewed: 08/02/2017 Elsevier Patient Education  [place].

## 2020-04-27 NOTE — Progress Notes (Addendum)
Subjective:    Patient ID: Roger Cal., male    DOB: 16-Apr-1960, 60 y.o.   MRN: [id number]  Chief Complaint  Patient presents with  . Wrist Pain    HPI Patient was seen today for acute concern.  Patient endorses left wrist and thumb pain x2 weeks.  States hears a pop in thumb when bending and pain as thumb gets stuck in flexion.  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  Pt drinking a 24 case of beer in a wk, eating seafood occasionally and gizzards.  May have mushrooms every morning.  Pt also has numbness and tingling in L hand.  Pt tried over-the-counter meds Aspercreme, and leftover tramadol without relief.  Past Medical History:  Diagnosis Date  . Allergic rhinitis, seasonal   . Allergy    seasonal  . Colon polyp 02/2012   tubular adenoma, Dr. Zenovia Jarred  . De Quervain's tenosynovitis, right    hx/o  . Epididymitis    hx/o as adult  . Erectile dysfunction   . Family history of premature CAD    father  . Heart murmur   . Hemorrhoids    hx of  . Hyperlipidemia   . Left inguinal hernia    tiny as of 03/2014  . MRSA (methicillin resistant staph aureus) culture positive    history of MRSA skin on pt's back/ unsure of date  . Normal cardiac stress test    remote past per patient, [place], [place]  . Obesity   . OSA on CPAP   . Recurrent UTI    Dr. Janice Norrie, [facility]  . Sleep apnea    uses c-pap  . Tinea pedis   . Tobacco use    4 cigarrettes daily  . Wears glasses     No Known Allergies  ROS General: Denies fever, chills, night sweats, changes in weight, changes in appetite HEENT: Denies headaches, ear pain, changes in vision, rhinorrhea, sore throat CV: Denies CP, palpitations, SOB, orthopnea Pulm: Denies SOB, cough, wheezing GI: Denies abdominal pain, nausea, vomiting, diarrhea, constipation GU: Denies dysuria, hematuria, frequency Msk: Denies muscle cramps, joint pains + left wrist  pain, left thumb pain, left thumb trigger finger Neuro: Denies weakness  + numbness, tingling in left hand Skin: Denies rashes, bruising Psych: Denies depression, anxiety, hallucinations     Objective:    Blood pressure 120/82, pulse 81, temperature 99.2 F (37.3 C), temperature source Oral, weight (!) 315 lb 9.6 oz (143.2 kg), SpO2 96 %.  Gen. Pleasant, well-nourished, in no distress, normal affect   HEENT: [place]/AT, wearing glasses, face symmetric, conjunctiva clear, no scleral icterus, PERRLA, EOMI, nares patent without drainage Lungs: no accessory muscle use, CTAB, no wheezes or rales Cardiovascular: RRR, no m/r/g, no peripheral edema Musculoskeletal: Mild edema of left wrist and hand.  Increased warmth of the anterior surface of left wrist.  Negative Tinel and Phalen's bilaterally.  Negative numbness and tingling with tapping on lateral and medial epicondyles bilaterally.  No TTP in or around anatomical snuffbox.  No wrist pain with rotation of left thumb.  Left thumb trigger finger.  no deformities, no cyanosis or clubbing, normal tone Neuro:  A&Ox3, CN II-XII intact, normal gait Skin:  Warm, no lesions/ rash.  Increased warmth of left anterior wrist.   Wt Readings from Last 3 Encounters:  04/27/20 (!) 315 lb 9.6 oz (143.2 kg)  04/10/20 (!) 313 lb (142  kg)  08/19/19 (!) 298 lb (135.2 kg)    Lab Results  Component Value Date   WBC 11.3 (H) 08/19/2019   HGB 13.8 08/19/2019   HCT 41.4 08/19/2019   PLT 271.0 08/19/2019   GLUCOSE 106 (H) 08/19/2019   CHOL 201 (H) 08/19/2019   TRIG 141.0 08/19/2019   HDL 34.60 (L) 08/19/2019   LDLDIRECT 142 (H) 01/04/2012   LDLCALC 138 (H) 08/19/2019   ALT 45 08/19/2019   AST 51 (H) 08/19/2019   NA 140 08/19/2019   K 4.5 08/19/2019   CL 106 08/19/2019   CREATININE 1.15 08/19/2019   BUN 12 08/19/2019   CO2 28 08/19/2019   TSH 2.59 08/19/2019   PSA 2.08 08/19/2019   INR 1.0 02/26/2019   HGBA1C 4.9 08/19/2019    Assessment/Plan:  Left  wrist pain  -Discussed possible causes including gout flare, arthritis, carpal tunnel -We will obtain labs -Discussed supportive care including heat, rest, ice, topical analgesics -We will start prednisone taper and NSAIDs -Patient given a list of foods known to cause a buildup of uric acid. -Follow-up as needed for continued or worsened symptoms - Plan: Uric Acid, CBC with Differential/Platelet, Basic metabolic panel, predniSONE (DELTASONE) 10 MG tablet, meloxicam (MOBIC) 7.5 MG tablet  Chronic pain of left thumb -Discussed possible causes including gout, arthritis, trigger finger -Visible triggering of finger on exam -We will start prednisone taper and NSAID -For continued symptoms follow-up with Ortho as seen in the past - Plan: predniSONE (DELTASONE) 10 MG tablet, meloxicam (MOBIC) 7.5 MG tablet  Trigger finger of left thumb -Discussed treatment options for trigger finger -We will start prednisone taper and NSAIDs -Continue to worsen symptoms follow-up with Ortho  Numbness and tingling in left hand -Consider vitamin deficiency, carpal tunnel, DM -Hemoglobin A1c 4.9% on 08/19/2019 -We will obtain labs -Further recommendations based on labs. - Plan: CBC with Differential/Platelet, Vitamin B12, Folate, Basic metabolic panel  F/u as needed for continued or worsening symptoms  Grier Mitts, MD

## 2020-05-05 NOTE — Progress Notes (Signed)
Spoke with patient, is aware of results.

## 2020-05-11 ENCOUNTER — Other Ambulatory Visit: Payer: Self-pay | Admitting: Family Medicine

## 2020-06-09 ENCOUNTER — Other Ambulatory Visit: Payer: Self-pay | Admitting: *Deleted

## 2020-06-09 DIAGNOSIS — Z902 Acquired absence of lung [part of]: Secondary | ICD-10-CM

## 2020-06-09 DIAGNOSIS — R918 Other nonspecific abnormal finding of lung field: Secondary | ICD-10-CM

## 2020-06-10 ENCOUNTER — Telehealth: Payer: Self-pay | Admitting: Family Medicine

## 2020-06-10 NOTE — Telephone Encounter (Signed)
Patient is calling and stated that he is still experiencing wrist pain and wanted to see if provider can refer him to see Ortencia Kick a sports medicine provider, please advise. CB is [phone]

## 2020-06-12 ENCOUNTER — Other Ambulatory Visit: Payer: Self-pay

## 2020-06-12 DIAGNOSIS — M25532 Pain in left wrist: Secondary | ICD-10-CM

## 2020-06-12 NOTE — Telephone Encounter (Signed)
Okay for referral to sports med.

## 2020-06-12 NOTE — Telephone Encounter (Signed)
Referral placed.

## 2020-06-18 NOTE — Progress Notes (Signed)
Subjective:    I'm seeing this patient as a consultation for:  Dr. Volanda Napoleon. Note will be routed back to referring provider/PCP.  CC: L wrist and thumb pain  I, Molly Weber, LAT, ATC, am serving as scribe for Dr. Lynne Leader.  HPI: Pt is a 60 y/o male presenting w/ L wrist/thumb pain since late March 2022 w/ no known MOI. Pt recalls driving the forklift and then seat coming down on her hand/wrist. He locates his pain to radial aspect, into the 1st MC, and posterior aspect across carpals.  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 Pt is R-hand dominate.  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  Swelling: yes Radiating: yes- sometimes proximally into forearm Numbness/tingling: yes Mechanical symptoms: yes in the L thumb, popping and gets stuck in flexion Aggravating factors: pronation Treatments tried: OTC anti-inflammatories; Tramadol; Aspercreme; Meloxicam; Prednisone, brace, glove  Past medical history, Surgical history, Family history, Social history, Allergies, and medications have been entered into the medical record, reviewed.   Review of Systems: No new headache, visual changes, nausea, vomiting, diarrhea, constipation, dizziness, abdominal pain, skin rash, fevers, chills, night sweats, weight loss, swollen lymph nodes, body aches, joint swelling, muscle aches, chest pain, shortness of breath, mood changes, visual or auditory hallucinations.   Objective:    Vitals:   06/19/20 0800  BP: (!) 146/92  Pulse: 87  SpO2: 95%   General: Well Developed, well nourished, and in no acute distress.  Neuro/Psych: Alert and oriented x3, extra-ocular muscles intact, able to move all 4 extremities, sensation grossly intact. Skin: Warm and dry, no rashes noted.  Respiratory: Not using accessory muscles, speaking in full sentences, trachea midline.  Cardiovascular: Pulses palpable, no extremity edema. Abdomen: Does not appear distended. MSK:   Left hand and wrist normal-appearing Tender palpation radial styloid. Minimally tender palpation volar first [place]. Minimal triggering present with flexion of the thumb and IP joint.  Left wrist pain normal strength.  Positive Finkelstein's test.  Pulses cap refill and sensation are intact  Lab and Radiology Results  Diagnostic Limited MSK Ultrasound of: Left thumb and wrist Left first dorsal wrist compartment.  Intact tendon however significant hypoechoic fluid within tendon sheath consistent with de Quervain's tenosynovitis. Left thumb A1 pulley volar MCP.  Intact tendon sheath however hypoechoic fluid tracks within tendon sheath and thickened A1 pulley consistent with trigger finger.  Impression: De Quervain's tenosynovitis and trigger thumb   Impression and Recommendations:    Assessment and Plan: 60 y.o. male with left de Quervain tenosynovitis and trigger thumb.  The symptoms are mild and currently likely to be treatable conservatively.  Additionally patient would like to avoid injections today if possible.  Plan for double Band-Aid splint of the thumb for trigger thumb and plan for thumb spica splint for de Quervain's tenosynovitis.  Additionally reviewed hand therapy exercises home exercise program.  Also recommend Voltaren gel.  Recheck in 1 month.  If not better consider injection or referral to hand PT.Marland Kitchen  PDMP not reviewed this encounter. Orders Placed This Encounter  Procedures  . [place] LIMITED JOINT SPACE STRUCTURES UP LEFT(NO LINKED CHARGES)    Standing Status:   Future    Number of Occurrences:   1    Standing Expiration Date:   12/20/2020    Order Specific Question:   Reason for Exam (SYMPTOM  OR DIAGNOSIS REQUIRED)    Answer:   left thumb pain  Order Specific Question:   Preferred imaging location?    Answer:   [place]   No orders of the defined types were placed in this encounter.   Discussed warning signs or symptoms. Please see  discharge instructions. Patient expresses understanding.   The above documentation has been reviewed and is accurate and complete Lynne Leader, M.D.

## 2020-06-19 ENCOUNTER — Ambulatory Visit (INDEPENDENT_AMBULATORY_CARE_PROVIDER_SITE_OTHER): Payer: 59 | Admitting: Family Medicine

## 2020-06-19 ENCOUNTER — Encounter: Payer: Self-pay | Admitting: Family Medicine

## 2020-06-19 ENCOUNTER — Ambulatory Visit: Payer: Self-pay

## 2020-06-19 ENCOUNTER — Other Ambulatory Visit: Payer: Self-pay

## 2020-06-19 VITALS — BP 146/92 | HR 87 | Wt 317.4 lb

## 2020-06-19 DIAGNOSIS — M65312 Trigger thumb, left thumb: Secondary | ICD-10-CM

## 2020-06-19 DIAGNOSIS — M79645 Pain in left finger(s): Secondary | ICD-10-CM | POA: Diagnosis not present

## 2020-06-19 DIAGNOSIS — M654 Radial styloid tenosynovitis [de Quervain]: Secondary | ICD-10-CM | POA: Diagnosis not present

## 2020-06-19 NOTE — Patient Instructions (Addendum)
Thank you for coming in today.  Use the double baindaid splint for trigger finger.   Use the big wrist splint for dequervains at work and as needed.   Please use Voltaren gel (Generic Diclofenac Gel) up to 4x daily for pain as needed.  This is available over-the-counter as both the name brand Voltaren gel and the generic diclofenac gel.  Do the exercise we discussed.   Recheck in 1 month.    De Quervain's Tenosynovitis  De Quervain's tenosynovitis is a condition that causes inflammation of the tendon on the thumb side of the wrist. Tendons are cords of tissue that connect bones to muscles. The tendons in the hand pass through a tunnel called a sheath. A slippery layer of tissue (synovium) lets the tendons move smoothly in the sheath. With de Quervain's tenosynovitis, the sheath swells or thickens, causing friction and pain. The condition is also called de Quervain's disease and de Quervain's syndrome. It occurs most often in women who are 69-46 years old. What are the causes? The exact cause of this condition is not known. It may be associated with overuse of the hand and wrist. What increases the risk? You are more likely to develop this condition if you:  Use your hands far more than normal, especially if you repeat certain movements that involve twisting your hand or using a tight grip.  Are pregnant.  Are a middle-aged woman.  Have rheumatoid arthritis.  Have diabetes. What are the signs or symptoms? The main symptom of this condition is pain on the thumb side of the wrist. The pain may get worse when you grasp something or turn your wrist. Other symptoms may include:  Pain that extends up the forearm.  Swelling of your wrist and hand.  Trouble moving the thumb and wrist.  A sensation of snapping in the wrist.  A bump filled with fluid (cyst) in the area of the pain. How is this diagnosed? This condition may be diagnosed based on:  Your symptoms and medical  history.  A physical exam. During the exam, your health care provider may do a simple test Wynn Maudlin test) that involves pulling your thumb and wrist to see if this causes pain. You may also need to have an X-ray or ultrasound. How is this treated? Treatment for this condition may include:  Avoiding any activity that causes pain and swelling.  Taking medicines. Anti-inflammatory medicines and corticosteroid injections may be used to reduce inflammation and relieve pain.  Wearing a splint.  Having surgery. This may be needed if other treatments do not work. Once the pain and swelling have gone down, you may start:  Physical therapy. This includes exercises to improve movement and strength in your wrist and thumb.  Occupational therapy. This includes adjusting how you move your wrist. Follow these instructions at home: If you have a splint:  Wear the splint as told by your health care provider. Remove it only as told by your health care provider.  Loosen the splint if your fingers tingle, become numb, or turn cold and blue.  Keep the splint clean.  If the splint is not waterproof: ? Do not let it get wet. ? Cover it with a watertight covering when you take a bath or a shower. Managing pain, stiffness, and swelling  Avoid movements and activities that cause pain and swelling in the wrist area.  If directed, put ice on the painful area. This may be helpful after doing activities that involve the sore wrist.  To do this: ? Put ice in a plastic bag. ? Place a towel between your skin and the bag. ? Leave the ice on for 20 minutes, 2-3 times a day. ? Remove the ice if your skin turns bright red. This is very important. If you cannot feel pain, heat, or cold, you have a greater risk of damage to the area.  Move your fingers often to reduce stiffness and swelling.  Raise (elevate) the injured area above the level of your heart while you are sitting or lying down.   General  instructions  Return to your normal activities as told by your health care provider. Ask your health care provider what activities are safe for you.  Take over-the-counter and prescription medicines only as told by your health care provider.  Keep all follow-up visits. This is important. Contact a health care provider if:  Your pain medicine does not help.  Your pain gets worse.  You develop new symptoms. Summary  De Quervain's tenosynovitis is a condition that causes inflammation of the tendon on the thumb side of the wrist.  The condition occurs most often in women who are 75-49 years old.  The exact cause of this condition is not known. It may be associated with overuse of the hand and wrist.  Treatment starts with avoiding activity that causes pain or swelling in the wrist area. Other treatments may include wearing a splint and taking medicine. Sometimes, surgery is needed. This information is not intended to replace advice given to you by your health care provider. Make sure you discuss any questions you have with your health care provider. Document Revised: 04/24/2019 Document Reviewed: 04/24/2019 Elsevier Patient Education  2021 [employer].

## 2020-07-17 ENCOUNTER — Other Ambulatory Visit: Payer: 59

## 2020-07-17 ENCOUNTER — Telehealth: Payer: 59 | Admitting: Thoracic Surgery (Cardiothoracic Vascular Surgery)

## 2020-07-17 NOTE — Progress Notes (Signed)
I, Wendy Poet, LAT, ATC, am serving as scribe for Dr. Lynne Leader.  Roger Rod. is a 60 y.o. male who presents to [place] [facility] today for f/u of L wrist and thumb pain since late March 2022.  He was last seen by Dr. Georgina Snell on 06/19/20 and was advised to wear a thumb spica splint for heavier work and a double band-aid splint for lighter activities.  He was also advised to use Voltaren gel and to do a HEP per Dr. Georgina Snell.  Since his last visit, pt reports that his L thumb and wrist are somewhat better but not great.  He has consistently wearing his brace but con't to have pain w/ full L thumb flexion and wrist UD.  He has taking Tylenol and has tried Voltaren gel and Biofreeze w/ no relief.  He states that he is out of several medications and is interested in refills if possible for meloxicam, tizanidine and tramadol.  He is not sure why he was prescribed the tramadol.  He thinks maybe it was for when his tooth was removed a few months ago.     Pertinent review of systems: No fevers or chills  Relevant historical information: Gout   Exam:  BP 120/74 (BP Location: Left Arm, Patient Position: Sitting, Cuff Size: Large)   Pulse 83   Ht 6\' 1"  (1.854 m)   Wt (!) 317 lb 6.4 oz (144 kg)   SpO2 98%   BMI 41.88 kg/m  General: Well Developed, well nourished, and in no acute distress.   MSK: Left wrist slightly swollen at radial styloid.  Tender palpation radial styloid.  Positive Finkelstein's test. No triggering present at the thumb.  Nontender volar first MCP    Lab and Radiology Results  Procedure: Real-time Ultrasound Guided Injection of first dorsal wrist compartment tendon sheath (de Quervain's injection) Device: Philips Affiniti 50G Images permanently stored and available for review in PACS Verbal informed consent obtained.  Discussed risks and benefits of procedure. Warned about infection bleeding damage to structures skin hypopigmentation and  fat atrophy among others. Patient expresses understanding and agreement Time-out conducted.   Noted no overlying erythema, induration, or other signs of local infection.   Skin prepped in a sterile fashion.   Local anesthesia: Topical Ethyl chloride.   With sterile technique and under real time ultrasound guidance: 40 mg of Kenalog and 1 mL of lidocaine injected into tendon sheath first dorsal wrist compartment. Fluid seen entering the tendon sheath.   Completed without difficulty   Pain immediately resolved suggesting accurate placement of the medication.   Advised to call if fevers/chills, erythema, induration, drainage, or persistent bleeding.   Images permanently stored and available for review in the ultrasound unit.  Impression: Technically successful ultrasound guided injection.         Assessment and Plan: 60 y.o. male with left de Quervain tenosynovitis.  Minimally improved with bracing and conservative management.  Effectively failing conservative management however.  Plan for trial of injection today.  Use brace for another few weeks and then liberalize activity.  Trigger thumb has improved significantly.  Recheck back as needed.  Meloxicam and tizanidine refilled.  After discussion patient does not need tramadol.   PDMP not reviewed this encounter. Orders Placed This Encounter  Procedures   [place] LIMITED JOINT SPACE STRUCTURES UP LEFT(NO LINKED CHARGES)    Order Specific Question:   Reason for Exam (SYMPTOM  OR DIAGNOSIS REQUIRED)    Answer:  L thumb pain    Order Specific Question:   Preferred imaging location?    Answer:   [place]   Meds ordered this encounter  Medications   meloxicam (MOBIC) 7.5 MG tablet    Sig: Take 1 tablet (7.5 mg total) by mouth daily as needed for pain.    Dispense:  30 tablet    Refill:  0   tiZANidine (ZANAFLEX) 2 MG tablet    Sig: Take 1-2 tablets (2-4 mg total) by mouth at bedtime as needed for muscle  spasms.    Dispense:  60 tablet    Refill:  1     Discussed warning signs or symptoms. Please see discharge instructions. Patient expresses understanding.   The above documentation has been reviewed and is accurate and complete Lynne Leader, M.D.

## 2020-07-20 ENCOUNTER — Ambulatory Visit: Payer: Self-pay

## 2020-07-20 ENCOUNTER — Other Ambulatory Visit: Payer: Self-pay

## 2020-07-20 ENCOUNTER — Ambulatory Visit
Admission: RE | Admit: 2020-07-20 | Discharge: 2020-07-20 | Disposition: A | Discharge status: Home / Self Care | Payer: 59 | Source: Ambulatory Visit | Attending: Thoracic Surgery (Cardiothoracic Vascular Surgery) | Admitting: Thoracic Surgery (Cardiothoracic Vascular Surgery)

## 2020-07-20 ENCOUNTER — Ambulatory Visit (INDEPENDENT_AMBULATORY_CARE_PROVIDER_SITE_OTHER): Payer: 59 | Admitting: Family Medicine

## 2020-07-20 ENCOUNTER — Encounter: Payer: Self-pay | Admitting: Family Medicine

## 2020-07-20 VITALS — BP 120/74 | HR 83 | Ht 73.0 in | Wt 317.4 lb

## 2020-07-20 DIAGNOSIS — M79645 Pain in left finger(s): Secondary | ICD-10-CM | POA: Diagnosis not present

## 2020-07-20 DIAGNOSIS — M654 Radial styloid tenosynovitis [de Quervain]: Secondary | ICD-10-CM

## 2020-07-20 DIAGNOSIS — M25532 Pain in left wrist: Secondary | ICD-10-CM | POA: Diagnosis not present

## 2020-07-20 DIAGNOSIS — G8929 Other chronic pain: Secondary | ICD-10-CM

## 2020-07-20 DIAGNOSIS — R918 Other nonspecific abnormal finding of lung field: Secondary | ICD-10-CM

## 2020-07-20 DIAGNOSIS — Z902 Acquired absence of lung [part of]: Secondary | ICD-10-CM

## 2020-07-20 MED ORDER — TIZANIDINE HCL 2 MG PO TABS: 2.0000 mg | ORAL_TABLET | Freq: Every evening | ORAL | 1 refills | Status: DC | PRN

## 2020-07-20 MED ORDER — MELOXICAM 7.5 MG PO TABS: 7.5000 mg | ORAL_TABLET | Freq: Every day | ORAL | 0 refills | Status: DC | PRN

## 2020-07-20 NOTE — Patient Instructions (Addendum)
Good to see you today.  You had a L wrist injection.  Call or go to the ER if you develop a large red swollen joint with extreme pain or oozing puss.   Con't to wear your splint/brace.  Follow-up as needed.  Use the brace for a few weeks then only as needed.   Return if not better or if something else happens.   Use the meloxicam sparingly.   Use the muscle relaxer as needed occasionally as well.

## 2020-07-24 ENCOUNTER — Other Ambulatory Visit: Payer: Self-pay

## 2020-07-24 ENCOUNTER — Telehealth (INDEPENDENT_AMBULATORY_CARE_PROVIDER_SITE_OTHER): Payer: 59 | Admitting: Thoracic Surgery (Cardiothoracic Vascular Surgery)

## 2020-07-24 DIAGNOSIS — R911 Solitary pulmonary nodule: Secondary | ICD-10-CM

## 2020-07-24 NOTE — Progress Notes (Signed)
     [address]       [place] [address]             [phone]       Patient: Home Provider: Office Consent for Telemedicine visit obtained.  Today's visit was completed via a real-time telehealth (see specific modality noted below). The patient/authorized person provided oral consent at the time of the visit to engage in a telemedicine encounter with the present provider at [facility]. The patient/authorized person was informed of the potential benefits, limitations, and risks of telemedicine. The patient/authorized person expressed understanding that the laws that protect confidentiality also apply to telemedicine. The patient/authorized person acknowledged understanding that telemedicine does not provide emergency services and that he or she would need to call 911 or proceed to the nearest hospital for help if such a need arose.   Total time spent in the clinical discussion 10 minutes.  Telehealth Modality: Phone visit (audio only)  I had a telephone visit with Roger Mendoza.  He is doing well.  We went over the results of his CT scan.  There is not many concerning features however there is some area around the scar that appears slightly changed.  He will get another CT scan in 6 months as opposed to a year.

## 2020-08-05 ENCOUNTER — Encounter: Payer: Self-pay | Admitting: Internal Medicine

## 2020-08-10 ENCOUNTER — Other Ambulatory Visit: Payer: Self-pay | Admitting: Family Medicine

## 2020-08-10 DIAGNOSIS — M25532 Pain in left wrist: Secondary | ICD-10-CM

## 2020-08-10 DIAGNOSIS — G8929 Other chronic pain: Secondary | ICD-10-CM

## 2020-08-10 NOTE — Telephone Encounter (Signed)
Rx refill request approved per Dr. Corey's orders. 

## 2020-08-11 ENCOUNTER — Other Ambulatory Visit: Payer: Self-pay | Admitting: Family Medicine

## 2020-08-12 NOTE — Telephone Encounter (Signed)
Rx refill request approved per Dr. Corey's orders. 

## 2020-08-21 ENCOUNTER — Ambulatory Visit (INDEPENDENT_AMBULATORY_CARE_PROVIDER_SITE_OTHER): Payer: 59 | Admitting: Family Medicine

## 2020-08-21 ENCOUNTER — Encounter: Payer: Self-pay | Admitting: Family Medicine

## 2020-08-21 ENCOUNTER — Other Ambulatory Visit: Payer: Self-pay

## 2020-08-21 VITALS — BP 128/82 | HR 71 | Temp 98.1°F | Ht 73.0 in | Wt 311.6 lb

## 2020-08-21 DIAGNOSIS — Z23 Encounter for immunization: Secondary | ICD-10-CM | POA: Diagnosis not present

## 2020-08-21 DIAGNOSIS — E782 Mixed hyperlipidemia: Secondary | ICD-10-CM

## 2020-08-21 DIAGNOSIS — R232 Flushing: Secondary | ICD-10-CM | POA: Diagnosis not present

## 2020-08-21 DIAGNOSIS — Z113 Encounter for screening for infections with a predominantly sexual mode of transmission: Secondary | ICD-10-CM

## 2020-08-21 DIAGNOSIS — Z125 Encounter for screening for malignant neoplasm of prostate: Secondary | ICD-10-CM | POA: Diagnosis not present

## 2020-08-21 DIAGNOSIS — Z0000 Encounter for general adult medical examination without abnormal findings: Secondary | ICD-10-CM | POA: Diagnosis not present

## 2020-08-21 DIAGNOSIS — Z85118 Personal history of other malignant neoplasm of bronchus and lung: Secondary | ICD-10-CM

## 2020-08-21 LAB — CBC WITH DIFFERENTIAL/PLATELET
Basophils Absolute: 0 10*3/uL (ref 0.0–0.1)
Basophils Relative: 0.2 % (ref 0.0–3.0)
Eosinophils Absolute: 0.2 10*3/uL (ref 0.0–0.7)
Eosinophils Relative: 2.6 % (ref 0.0–5.0)
HCT: 41.6 % (ref 39.0–52.0)
Hemoglobin: 13.9 g/dL (ref 13.0–17.0)
Lymphocytes Relative: 28.7 % (ref 12.0–46.0)
Lymphs Abs: 2.4 10*3/uL (ref 0.7–4.0)
MCHC: 33.5 g/dL (ref 30.0–36.0)
MCV: 90.8 fl (ref 78.0–100.0)
Monocytes Absolute: 0.5 10*3/uL (ref 0.1–1.0)
Monocytes Relative: 5.6 % (ref 3.0–12.0)
Neutro Abs: 5.3 10*3/uL (ref 1.4–7.7)
Neutrophils Relative %: 62.9 % (ref 43.0–77.0)
Platelets: 243 10*3/uL (ref 150.0–400.0)
RBC: 4.58 Mil/uL (ref 4.22–5.81)
RDW: 13.1 % (ref 11.5–15.5)
WBC: 8.4 10*3/uL (ref 4.0–10.5)

## 2020-08-21 LAB — COMPREHENSIVE METABOLIC PANEL
ALT: 29 U/L (ref 0–53)
AST: 19 U/L (ref 0–37)
Albumin: 4.9 g/dL (ref 3.5–5.2)
Alkaline Phosphatase: 74 U/L (ref 39–117)
BUN: 12 mg/dL (ref 6–23)
CO2: 26 mEq/L (ref 19–32)
Calcium: 9.7 mg/dL (ref 8.4–10.5)
Chloride: 104 mEq/L (ref 96–112)
Creatinine, Ser: 1.03 mg/dL (ref 0.40–1.50)
GFR: 78.82 mL/min (ref >60.00)
Glucose, Bld: 92 mg/dL (ref 70–99)
Potassium: 4.1 mEq/L (ref 3.5–5.1)
Sodium: 141 mEq/L (ref 135–145)
Total Bilirubin: 0.7 mg/dL (ref 0.2–1.2)
Total Protein: 7.7 g/dL (ref 6.0–8.3)

## 2020-08-21 LAB — VITAMIN D 25 HYDROXY (VIT D DEFICIENCY, FRACTURES): VITD: 22.95 ng/mL — ABNORMAL LOW (ref 30.00–100.00)

## 2020-08-21 LAB — LIPID PANEL
Cholesterol: 242 mg/dL — ABNORMAL HIGH (ref 0–200)
HDL: 43.5 mg/dL (ref >39.00)
LDL Cholesterol: 173 mg/dL — ABNORMAL HIGH (ref 0–99)
NonHDL: 198.3
Total CHOL/HDL Ratio: 6
Triglycerides: 126 mg/dL (ref 0.0–149.0)
VLDL: 25.2 mg/dL (ref 0.0–40.0)

## 2020-08-21 LAB — TSH: TSH: 1.83 u[IU]/mL (ref 0.35–5.50)

## 2020-08-21 LAB — HEMOGLOBIN A1C: Hgb A1c MFr Bld: 5.2 % (ref 4.6–6.5)

## 2020-08-21 LAB — PSA: PSA: 0.41 ng/mL (ref 0.10–4.00)

## 2020-08-21 LAB — T4, FREE: Free T4: 0.69 ng/dL (ref 0.60–1.60)

## 2020-08-21 NOTE — Progress Notes (Signed)
Subjective:     Roger Mendoza. is a 60 y.o. male and is here for a comprehensive physical exam. The patient reports hot flashes.  Patient recently received a steroid injection in left wrist for de Quervain's tenosynovitis.  Notes improvement in pain.  Pt s/p lobectomy for lung cancer, nodule noted on CT chest 01/21/19.  Pt is a former smoker.  Trying to exercise more and eat more fruit.  Lost a few lbs.  Colonoscopy done 07/04/2017.  Social History   Socioeconomic History   Marital status: Married    Spouse name: Not on file   Number of children: Not on file   Years of education: Not on file   Highest education level: Not on file  Occupational History   Occupation: loads truck  Tobacco Use   Smoking status: Former    Packs/day: 0.25    Years: 15.00    Pack years: 3.75    Types: Cigarettes    Quit date: 03/31/2014    Years since quitting: 6.3   Smokeless tobacco: Never  Vaping Use   Vaping Use: Never used  Substance and Sexual Activity   Alcohol use: Yes    Alcohol/week: 15.0 standard drinks    Types: 3 Glasses of wine, 12 Cans of beer per week    Comment: couple glasses of wine per day   Drug use: No   Sexual activity: Not on file  Other Topics Concern   Not on file  Social History Narrative   Married, has 23yo son, works for [employer] at [employer], loading trucks, exercise - walks a lot at work, diet - has cut back on sweets, bread and greasy foods.  Plays golf, used to play softball.  Attends church, de[employer].  04/2016   Social Determinants of Health   Financial Resource Strain: Not on file  Food Insecurity: Not on file  Transportation Needs: Not on file  Physical Activity: Not on file  Stress: Not on file  Social Connections: Not on file  Intimate Partner Violence: Not on file   Health Maintenance  Topic Date Due   Pneumococcal Vaccine 82-64 Years old (1 - PCV) Never done   Zoster Vaccines- Shingrix (1 of 2) Never done   COVID-19 Vaccine (4 - Booster for  Level Green series) 04/09/2020   COLONOSCOPY (Pts 45-82yrs Insurance coverage will need to be confirmed)  07/04/2020   INFLUENZA VACCINE  08/24/2020   TETANUS/TDAP  09/16/2020   Hepatitis C Screening  Completed   HIV Screening  Completed   HPV VACCINES  Aged Out    The following portions of the patient's history were reviewed and updated as appropriate: allergies, current medications, past family history, past medical history, past social history, past surgical history, and problem list.  Review of Systems Pertinent items noted in HPI and remainder of comprehensive ROS otherwise negative.   Objective:    BP 128/82 (BP Location: Right Arm, Patient Position: Sitting, Cuff Size: Normal)   Pulse 71   Temp 98.1 F (36.7 C) (Oral)   Ht 6\' 1"  (1.854 m)   Wt (!) 311 lb 9.6 oz (141.3 kg)   SpO2 97%   BMI 41.11 kg/m  General appearance: alert, cooperative, and no distress Head: Normocephalic, without obvious abnormality, atraumatic Eyes: conjunctivae/corneas clear. PERRL, EOM's intact. Fundi benign. Ears: normal TM's and external ear canals both ears Nose: Nares normal. Septum midline. Mucosa normal. No drainage or sinus tenderness. Throat: lips, mucosa, and tongue normal; teeth and gums normal Neck: no  adenopathy, no carotid bruit, no JVD, supple, symmetrical, trachea midline, and thyroid not enlarged, symmetric, no tenderness/mass/nodules Lungs: clear to auscultation bilaterally Heart: regular rate and rhythm, S1, S2 normal, no murmur, click, rub or gallop Abdomen: soft, non-tender; bowel sounds normal; no masses,  no organomegaly Extremities: extremities normal, atraumatic, no cyanosis or edema Pulses: 2+ and symmetric Skin: Skin color, texture, turgor normal. No rashes or lesions, acanthosis nigricans Lymph nodes: Cervical, supraclavicular, and axillary nodes normal. Neurologic: Alert and oriented X 3, normal strength and tone. Normal symmetric reflexes. Normal coordination and gait     Assessment:    Healthy male exam.     Plan:    Anticipatory guidance given including wearing seatbelts, smoke detectors in the home, increasing physical activity, increasing p.o. intake of water and vegetables. -PHQ-9 score 1 -GAD-7 score 0 -labs -colonoscopy done 07/04/2017.  3 yr recall due this yrs -immunizations reviewed.  Shingrix given this visit. -given handout -next CPE in 1 yr See After Visit Summary for Counseling Recommendations   Hot flash in male  - Plan: CMP, CBC with Differential/Platelet, TSH, T4, Free, Hemoglobin A1c  Mixed hyperlipidemia  -Continue lifestyle modifications and low-dose aspirin - Plan: Lipid panel  Screening for prostate cancer  - Plan: PSA  Morbid obesity ([place])  -Modifications - Plan: Vitamin D, 25-hydroxy  Need for shingles vaccine  - Plan: Varicella-zoster vaccine IM (Shingrix)  History of lung cancer -Status post lobectomy  Routine screening for STI (sexually transmitted infection)  - Plan: HIV Antibody (routine testing w rflx), RPR  F/u in 4-6 months  Grier Mitts, MD

## 2020-08-24 ENCOUNTER — Telehealth: Payer: Self-pay | Admitting: Family Medicine

## 2020-08-24 LAB — RPR: RPR Ser Ql: NONREACTIVE

## 2020-08-24 LAB — HIV ANTIBODY (ROUTINE TESTING W REFLEX): HIV 1&2 Ab, 4th Generation: NONREACTIVE

## 2020-08-24 NOTE — Telephone Encounter (Signed)
Pt would like his lab results

## 2020-08-26 ENCOUNTER — Other Ambulatory Visit: Payer: Self-pay | Admitting: Family Medicine

## 2020-08-26 DIAGNOSIS — E559 Vitamin D deficiency, unspecified: Secondary | ICD-10-CM | POA: Insufficient documentation

## 2020-08-26 MED ORDER — VITAMIN D (ERGOCALCIFEROL) 1.25 MG (50000 UNIT) PO CAPS: 50000.0000 [IU] | ORAL_CAPSULE | ORAL | 0 refills | Status: DC

## 2020-08-27 ENCOUNTER — Telehealth: Payer: Self-pay

## 2020-08-27 NOTE — Telephone Encounter (Signed)
Patient called wanting lab results, I informed pt of results,  pt is aware and verbalized understanding. Pt had a question regarding HIV results and will call back Monday.

## 2020-08-28 NOTE — Telephone Encounter (Signed)
Noted  

## 2020-08-28 NOTE — Telephone Encounter (Signed)
See other telephone encounter.

## 2020-08-31 ENCOUNTER — Telehealth: Payer: Self-pay

## 2020-08-31 NOTE — Telephone Encounter (Signed)
Patient returning call for results, pt informed of results and verbalized understanding pt would like to work on diet and exercise before taking any Rx has questions of HIV results

## 2020-09-02 NOTE — Telephone Encounter (Signed)
Spoke with patient, informed him of results from HIV test.

## 2020-09-08 ENCOUNTER — Encounter: Payer: Self-pay | Admitting: Family Medicine

## 2020-09-12 ENCOUNTER — Other Ambulatory Visit: Payer: Self-pay | Admitting: Family Medicine

## 2020-09-12 DIAGNOSIS — M25532 Pain in left wrist: Secondary | ICD-10-CM

## 2020-09-12 DIAGNOSIS — G8929 Other chronic pain: Secondary | ICD-10-CM

## 2020-10-17 ENCOUNTER — Other Ambulatory Visit: Payer: Self-pay | Admitting: Family Medicine

## 2020-10-17 DIAGNOSIS — M79645 Pain in left finger(s): Secondary | ICD-10-CM

## 2020-10-17 DIAGNOSIS — M25532 Pain in left wrist: Secondary | ICD-10-CM

## 2020-10-17 DIAGNOSIS — G8929 Other chronic pain: Secondary | ICD-10-CM

## 2020-10-22 ENCOUNTER — Other Ambulatory Visit: Payer: Self-pay

## 2020-10-22 ENCOUNTER — Ambulatory Visit (INDEPENDENT_AMBULATORY_CARE_PROVIDER_SITE_OTHER): Payer: 59

## 2020-10-22 DIAGNOSIS — Z23 Encounter for immunization: Secondary | ICD-10-CM

## 2020-10-22 NOTE — Progress Notes (Signed)
Per orders from Dr Volanda Napoleon, pt was given second dose of Shingles by Madaline Guthrie, pt tolerated well.

## 2020-10-28 ENCOUNTER — Telehealth: Payer: Self-pay

## 2020-10-28 NOTE — Telephone Encounter (Signed)
Please see previous notation and change PV appt closer to [facility] date if needed- [facility] procedure appt cancelled

## 2020-10-28 NOTE — Telephone Encounter (Signed)
Lance Coon, CRNA   Certified Registered Nurse Anesthetist  Specialty:  Certified Registered Nurse Anesthetist  Anesthesia Procedure Notes  Addendum  Date of Service:  02/28/2019  8:33 AM         Procedure Orders  Anesthesia Airway [[id number]] ordered by Lance Coon, CRNA                                                Procedure Name: Intubation Date/Time: 02/28/2019 8:14 AM Performed by: Lance Coon, CRNA Pre-anesthesia Checklist: Patient identified, Emergency Drugs available, Suction available and Patient being monitored Patient Re-evaluated:Patient Re-evaluated prior to induction Oxygen Delivery Method: Circle System Utilized Preoxygenation: Pre-oxygenation with 100% oxygen Induction Type: IV induction Laryngoscope Size: Glidescope and 4 Grade View: Grade I Tube type: Oral Endobronchial tube: Left, Double lumen EBT, EBT position confirmed by fiberoptic bronchoscope and EBT position confirmed by auscultation and 41 Fr Number of attempts: 3 Airway Equipment and Method: Stylet,  Video-laryngoscopy and Fiberoptic brochoscope (Vivasight DLT) Placement Confirmation: ETT inserted through vocal cords under direct vision,  positive ETCO2 and breath sounds checked- equal and bilateral Secured at: 29 cm Tube secured with: Tape Dental Injury: Teeth and Oropharynx as per pre-operative assessment  Comments: DL by Jerene Bears 4, g3v. DL by MD Mac4 g3v. Glidescope on and at bedside already with g1v    Dr. Hilarie Fredrickson and Jenny Reichmann,  Please review previous notation as pasted in this note- please advise if patient needs to be scheduled for an OV or a direct at [facility]?

## 2020-10-28 NOTE — Telephone Encounter (Signed)
Surveillance colonoscopy can be scheduled in the outpatient hospital setting at [facility] on an outpatient yellow block today on a Monday or Tuesday with me or the next available provider This is routine colon polyp surveillance

## 2020-10-29 ENCOUNTER — Other Ambulatory Visit: Payer: Self-pay

## 2020-10-29 DIAGNOSIS — Z1211 Encounter for screening for malignant neoplasm of colon: Secondary | ICD-10-CM

## 2020-10-29 NOTE — Telephone Encounter (Signed)
Pt scheduled for Colon at [facility] with Dr. Hilarie Fredrickson 01/07/21 at 10am. Case #[id number].

## 2020-11-04 ENCOUNTER — Ambulatory Visit (AMBULATORY_SURGERY_CENTER): Payer: Self-pay

## 2020-11-04 ENCOUNTER — Other Ambulatory Visit: Payer: Self-pay

## 2020-11-04 VITALS — Ht 73.0 in | Wt 325.0 lb

## 2020-11-04 DIAGNOSIS — Z800 Family history of malignant neoplasm of digestive organs: Secondary | ICD-10-CM

## 2020-11-04 MED ORDER — SUTAB 1479-225-188 MG PO TABS: 12.0000 | ORAL_TABLET | ORAL | 0 refills | Status: DC

## 2020-11-04 NOTE — Progress Notes (Signed)
No allergies to soy or egg Pt is not on blood thinners or diet pills Denies issues with sedation/intubation Denies atrial flutter/fib Denies constipation

## 2020-11-11 ENCOUNTER — Other Ambulatory Visit: Payer: Self-pay | Admitting: Family Medicine

## 2020-11-11 DIAGNOSIS — E559 Vitamin D deficiency, unspecified: Secondary | ICD-10-CM

## 2020-11-18 ENCOUNTER — Other Ambulatory Visit: Payer: Self-pay | Admitting: Family Medicine

## 2020-11-18 ENCOUNTER — Encounter: Payer: 59 | Admitting: Internal Medicine

## 2020-11-18 ENCOUNTER — Encounter: Payer: Self-pay | Admitting: Internal Medicine

## 2020-11-18 DIAGNOSIS — M25532 Pain in left wrist: Secondary | ICD-10-CM

## 2020-11-18 DIAGNOSIS — G8929 Other chronic pain: Secondary | ICD-10-CM

## 2020-11-18 NOTE — Telephone Encounter (Signed)
Rx refill request approved per Dr. Corey's orders. 

## 2020-12-07 ENCOUNTER — Encounter (HOSPITAL_COMMUNITY): Payer: Self-pay | Admitting: Emergency Medicine

## 2020-12-07 ENCOUNTER — Other Ambulatory Visit: Payer: Self-pay

## 2020-12-07 ENCOUNTER — Ambulatory Visit (HOSPITAL_COMMUNITY)
Admission: EM | Admit: 2020-12-07 | Discharge: 2020-12-07 | Disposition: A | Discharge status: Home / Self Care | Payer: 59 | Attending: Emergency Medicine | Admitting: Emergency Medicine

## 2020-12-07 DIAGNOSIS — M542 Cervicalgia: Secondary | ICD-10-CM | POA: Diagnosis not present

## 2020-12-07 MED ORDER — PREDNISONE 20 MG PO TABS: 40.0000 mg | ORAL_TABLET | Freq: Every day | ORAL | 0 refills | Status: DC

## 2020-12-07 MED ORDER — KETOROLAC TROMETHAMINE 30 MG/ML IJ SOLN
INTRAMUSCULAR | Status: AC
  Filled 2020-12-07: qty 1

## 2020-12-07 MED ORDER — KETOROLAC TROMETHAMINE 30 MG/ML IJ SOLN
30.0000 mg | Freq: Once | INTRAMUSCULAR | Status: AC
  Administered 2020-12-07: 30 mg via INTRAMUSCULAR

## 2020-12-07 NOTE — Discharge Instructions (Signed)
Your pain is most likely caused by irritation to the muscles or ligaments.   Your take prednisone every morning with food for 5 days  After completion take 15 mg of meloxicam every morning with food for 5 days then as needed  Can use your muscle relaxer at bedtime as needed   You may use heating pad in 15 minute intervals as needed for additional comfort, within the first 2-3 days you may find comfort in using ice in 10-15 minutes over affected area  Begin stretching affected area daily for 10 minutes as tolerated to further loosen muscles   When lying down place pillow underneath and between knees for support  Can try sleeping without pillow on firm mattress   Practice good posture: head back, shoulders back, chest forward, pelvis back and weight distributed evenly on both legs  If pain persist after recommended treatment or reoccurs if may be beneficial to follow up with orthopedic specialist for evaluation, this doctor specializes in the bones and can manage your symptoms long-term with options such as but not limited to imaging, medications or physical therapy

## 2020-12-07 NOTE — ED Triage Notes (Signed)
Pt reports woke up yesterday with intermittent pain in left shoulder, back, arm down to fingers.

## 2020-12-07 NOTE — ED Provider Notes (Signed)
[facility]    CSN: [id number] Arrival date & time: 12/07/20  1925      History   Chief Complaint Chief Complaint  Patient presents with   Back Pain   Arm Pain    HPI Quientin Jent. is a 60 y.o. male.   Patient presents with left sided neck pain radiating to shoulder blade, down left arm into hand for 1 day. Feels he may have slept wrong. ROM of neck intact, ROM of left shoulder intact. Has attempted use of tylenol and meloxicam with minimal relief.   Past Medical History:  Diagnosis Date   Allergic rhinitis, seasonal    Allergy    seasonal   Cancer ([place])    lung -  10yrs ago- is not under any kind of treatment   Colon polyp 02/2012   tubular adenoma, Dr. Sharee Holster Quervain's tenosynovitis, right    hx/o   Epididymitis    hx/o as adult   Erectile dysfunction    Family history of premature CAD    father   Heart murmur    Hemorrhoids    hx of   Hyperlipidemia    Left inguinal hernia    tiny as of 03/2014   MRSA (methicillin resistant staph aureus) culture positive    history of MRSA skin on pt's back/ unsure of date   Normal cardiac stress test    remote past per patient, [place], [place]   Obesity    OSA on CPAP    Recurrent UTI    Dr. Janice Norrie, [facility]   Sleep apnea    uses c-pap   Tinea pedis    Tobacco use    4 cigarrettes daily   Wears glasses     Patient Active Problem List   Diagnosis Date Noted   Vitamin D deficiency 08/26/2020   Trigger thumb, left thumb 06/19/2020   Right lower lobe pulmonary nodule 02/28/2019   Abnormal CT of the chest 02/06/2019   Solitary pulmonary nodule 02/06/2019   Lung nodule 02/06/2019   Daytime somnolence 07/20/2016   Former smoker 05/11/2016   Other fatigue 05/11/2016   Hot flashes 05/11/2016   Morbid obesity ([place]) 05/11/2016   Skin lesion 05/11/2016   External hemorrhoid 05/11/2016   Erectile dysfunction 05/11/2016   Encounter for hepatitis C screening test for low risk  patient 05/11/2016   Dyslipidemia 03/11/2016   De Quervain's tenosynovitis, left 12/21/2011   Routine general medical examination at a health care facility 11/02/2010   OSA (obstructive sleep apnea) 03/11/2010    Past Surgical History:  Procedure Laterality Date   ADENOIDECTOMY     CARDIAC CATHETERIZATION  02/11/2005   COLONOSCOPY  02/2012   Dr. Zenovia Jarred, repeat 02/2017   ESOPHAGEAL DILATION     INTERCOSTAL NERVE BLOCK Right 02/28/2019   Procedure: Intercostal Nerve Block;  Surgeon: Lajuana Matte, MD;  Location: [facility];  Service: Thoracic;  Laterality: Right;   NODE DISSECTION Right 02/28/2019   Procedure: Node Dissection;  Surgeon: Lajuana Matte, MD;  Location: [facility];  Service: Thoracic;  Laterality: Right;   [place]   left side   SKIN SURGERY     large area of MRSA on back/ over 10 years ago   [place] N/A 02/28/2019   Procedure: VIDEO BRONCHOSCOPY;  Surgeon: Lajuana Matte, MD;  Location: [facility];  Service: Thoracic;  Laterality:  N/A;       Home Medications    Prior to Admission medications   Medication Sig Start Date End Date Taking? Authorizing Provider  acetaminophen (TYLENOL) 325 MG tablet Take 650 mg by mouth every 6 (six) hours as needed.    [provider]  ASPIRIN LOW DOSE 81 MG EC tablet TAKE 1 TABLET BY MOUTH EVERY DAY 03/27/20   [name], MD  cetirizine (ZYRTEC) 10 MG tablet TAKE 1 TABLET BY MOUTH EVERY DAY Patient taking differently: Take 10 mg by mouth daily. 03/19/18   [name], MD  fluticasone (FLONASE) 50 MCG/ACT nasal spray Place 2 sprays into both nostrils daily. 05/12/16   Tysinger, Camelia Eng, PA-C  hydrocortisone (PROCTOSOL HC) 2.5 % rectal cream Place 1 application rectally 2 (two) times daily. Patient taking differently: Place 1 application rectally 2 (two) times daily as needed for hemorrhoids. 02/12/18   [name], MD   levocetirizine (XYZAL) 5 MG tablet Take 1 tablet (5 mg total) by mouth every evening. 08/19/19   [name], MD  meloxicam (MOBIC) 7.5 MG tablet TAKE 1 TABLET BY MOUTH EVERY DAY AS NEEDED FOR PAIN 11/18/20   Gregor Hams, MD  NON FORMULARY C PAP    [provider]  sildenafil (REVATIO) 20 MG tablet TAKE 1 TO 5 TABLETS BY MOUTH DAILY AS NEEDED PRIOR TO SEXUAL ACTIVITY 05/11/20   [name], MD  Sodium Sulfate-Mag Sulfate-KCl (SUTAB) [phone] MG TABS Take 12 tablets by mouth as directed. 11/04/20   Pyrtle, Lajuan Lines, MD  tiZANidine (ZANAFLEX) 2 MG tablet TAKE 1 TO 2 TABLETS BY MOUTH AT BEDTIME AS NEEDED FOR MUSCLE SPASMS 11/18/20   Gregor Hams, MD  Vitamin D, Ergocalciferol, (DRISDOL) 1.25 MG (50000 UNIT) CAPS capsule Take 1 capsule (50,000 Units total) by mouth every 7 (seven) days. 08/26/20   [name], MD    Family History Family History  Problem Relation Age of Onset   Hypertension Mother    Arthritis Mother    Hip dysplasia Mother    Heart disease Mother    Heart disease Father 82       died of MI   Hyperlipidemia Father    HIV Brother    Colon cancer Neg Hx    Esophageal cancer Neg Hx    Rectal cancer Neg Hx    Stomach cancer Neg Hx    Cancer Neg Hx    Diabetes Neg Hx    Stroke Neg Hx    Colitis Neg Hx     Social History Social History   Tobacco Use   Smoking status: Former    Packs/day: 0.25    Years: 15.00    Pack years: 3.75    Types: Cigarettes    Quit date: 03/31/2014    Years since quitting: 6.6   Smokeless tobacco: Never  Vaping Use   Vaping Use: Never used  Substance Use Topics   Alcohol use: Yes    Alcohol/week: 15.0 standard drinks    Types: 3 Glasses of wine, 12 Cans of beer per week    Comment: couple glasses of wine per day   Drug use: No     Allergies   Patient has no known allergies.   Review of Systems Review of Systems  Constitutional: Negative.   Respiratory: Negative.    Cardiovascular: Negative.    Gastrointestinal: Negative.   Musculoskeletal:  Positive for back pain. Negative for arthralgias, gait problem, joint swelling, myalgias, neck  pain and neck stiffness.  Skin: Negative.   Neurological: Negative.     Physical Exam Triage Vital Signs ED Triage Vitals  Enc Vitals Group     BP 12/07/20 1939 (!) 165/95     Pulse Rate 12/07/20 1939 (!) 105     Resp 12/07/20 1939 18     Temp 12/07/20 1939 98.6 F (37 C)     Temp Source 12/07/20 1939 Oral     SpO2 12/07/20 1939 97 %     Weight --      Height --      Head Circumference --      Peak Flow --      Pain Score 12/07/20 1938 8     Pain Loc --      Pain Edu? --      Excl. in [place]? --    No data found.  Updated Vital Signs BP (!) 165/95 (BP Location: Left Arm)   Pulse (!) 105   Temp 98.6 F (37 C) (Oral)   Resp 18   SpO2 97%   Visual Acuity Right Eye Distance:   Left Eye Distance:   Bilateral Distance:    Right Eye Near:   Left Eye Near:    Bilateral Near:     Physical Exam Constitutional:      Appearance: Normal appearance.  HENT:     Head: Normocephalic.  Eyes:     Extraocular Movements: Extraocular movements intact.  Neck:     Comments: Diffuse tenderness on the left lateral neck extending into the trapezius muscle. No point tenderness, ROM intact, no crepitus, swelling, rigidity noted  Pulmonary:     Effort: Pulmonary effort is normal.  Skin:    General: Skin is warm and dry.  Neurological:     Mental Status: He is alert and oriented to person, place, and time. Mental status is at baseline.  Psychiatric:        Mood and Affect: Mood normal.        Behavior: Behavior normal.     UC Treatments / Results  Labs (all labs ordered are listed, but only abnormal results are displayed) Labs Reviewed - No data to display  EKG   Radiology No results found.  Procedures Procedures (including critical care time)  Medications Ordered in UC Medications - No data to display  Initial Impression /  Assessment and Plan / UC Course  I have reviewed the triage vital signs and the nursing notes.  Pertinent labs & imaging results that were available during my care of the patient were reviewed by me and considered in my medical decision making (see chart for details).  Acute neck pain  Toradol 30 mg IM now Prednisone 40 mg for 5 days  Advised continued use of meloxicam after steroid course, may increase dose to 15 mg, advised beginning use of muscle relaxer, pillows for support, daily stretching as tolerated and heat for comfort Ortho follow up as needed  Final Clinical Impressions(s) / UC Diagnoses   Final diagnoses:  None   Discharge Instructions   None    ED Prescriptions   None    PDMP not reviewed this encounter.   Hans Eden, NP 12/08/20 1954

## 2020-12-23 ENCOUNTER — Other Ambulatory Visit: Payer: Self-pay | Admitting: Family Medicine

## 2020-12-23 DIAGNOSIS — M25532 Pain in left wrist: Secondary | ICD-10-CM

## 2020-12-23 DIAGNOSIS — G8929 Other chronic pain: Secondary | ICD-10-CM

## 2020-12-24 ENCOUNTER — Other Ambulatory Visit: Payer: Self-pay | Admitting: Family Medicine

## 2020-12-28 ENCOUNTER — Encounter (HOSPITAL_COMMUNITY): Payer: Self-pay | Admitting: Internal Medicine

## 2020-12-28 NOTE — Progress Notes (Signed)
Attempted to obtain medical history via telephone, unable to reach at this time. I left a voicemail to return pre surgical testing department's phone call.  

## 2020-12-31 ENCOUNTER — Other Ambulatory Visit: Payer: Self-pay | Admitting: Thoracic Surgery (Cardiothoracic Vascular Surgery)

## 2020-12-31 DIAGNOSIS — C3490 Malignant neoplasm of unspecified part of unspecified bronchus or lung: Secondary | ICD-10-CM

## 2021-01-06 ENCOUNTER — Telehealth: Payer: Self-pay | Admitting: Internal Medicine

## 2021-01-06 NOTE — Telephone Encounter (Signed)
Spoke with pt and he had questions about his prep. Instructions sent to pts email via the copier.

## 2021-01-06 NOTE — Telephone Encounter (Signed)
Inbound from patient requesting a call from a nurse please in regards to his upcoming procedure for tomorrow 01/07/2021 at [facility].

## 2021-01-07 ENCOUNTER — Ambulatory Visit (HOSPITAL_COMMUNITY): Payer: 59 | Admitting: Anesthesiology

## 2021-01-07 ENCOUNTER — Encounter (HOSPITAL_COMMUNITY): Payer: Self-pay | Admitting: Internal Medicine

## 2021-01-07 ENCOUNTER — Other Ambulatory Visit: Payer: Self-pay

## 2021-01-07 ENCOUNTER — Ambulatory Visit (HOSPITAL_COMMUNITY)
Admission: RE | Admit: 2021-01-07 | Discharge: 2021-01-07 | Disposition: A | Discharge status: Home / Self Care | Payer: 59 | Attending: Internal Medicine | Admitting: Internal Medicine

## 2021-01-07 ENCOUNTER — Other Ambulatory Visit: Payer: Self-pay | Admitting: Internal Medicine

## 2021-01-07 ENCOUNTER — Encounter (HOSPITAL_COMMUNITY)
Admission: RE | Disposition: A | Discharge status: Home / Self Care | Payer: Self-pay | Source: Home / Self Care | Attending: Internal Medicine

## 2021-01-07 DIAGNOSIS — D123 Benign neoplasm of transverse colon: Secondary | ICD-10-CM

## 2021-01-07 DIAGNOSIS — Z860100 Personal history of colon polyps, unspecified: Secondary | ICD-10-CM

## 2021-01-07 DIAGNOSIS — D120 Benign neoplasm of cecum: Secondary | ICD-10-CM | POA: Diagnosis not present

## 2021-01-07 DIAGNOSIS — D124 Benign neoplasm of descending colon: Secondary | ICD-10-CM

## 2021-01-07 DIAGNOSIS — K635 Polyp of colon: Secondary | ICD-10-CM | POA: Diagnosis not present

## 2021-01-07 DIAGNOSIS — K648 Other hemorrhoids: Secondary | ICD-10-CM | POA: Diagnosis not present

## 2021-01-07 DIAGNOSIS — K620 Anal polyp: Secondary | ICD-10-CM

## 2021-01-07 HISTORY — PX: POLYPECTOMY: SHX5525

## 2021-01-07 HISTORY — PX: COLONOSCOPY WITH PROPOFOL: SHX5780

## 2021-01-07 HISTORY — PX: BIOPSY: SHX5522

## 2021-01-07 SURGERY — COLONOSCOPY WITH PROPOFOL
Anesthesia: Monitor Anesthesia Care

## 2021-01-07 MED ORDER — LACTATED RINGERS IV SOLN
INTRAVENOUS | Status: DC
  Administered 2021-01-07: 1000 mL via INTRAVENOUS

## 2021-01-07 MED ORDER — PRAMOXINE-HC 1-1 % EX CREA: TOPICAL_CREAM | Freq: Three times a day (TID) | CUTANEOUS | 1 refills | Status: DC

## 2021-01-07 MED ORDER — PROPOFOL 500 MG/50ML IV EMUL
INTRAVENOUS | Status: DC | PRN
  Administered 2021-01-07: 120 ug/kg/min via INTRAVENOUS

## 2021-01-07 MED ORDER — PROPOFOL 10 MG/ML IV BOLUS
INTRAVENOUS | Status: DC | PRN
  Administered 2021-01-07: 30 mg via INTRAVENOUS
  Administered 2021-01-07 (×2): 20 mg via INTRAVENOUS
  Administered 2021-01-07: 10 mg via INTRAVENOUS
  Administered 2021-01-07: 30 mg via INTRAVENOUS
  Administered 2021-01-07: 20 mg via INTRAVENOUS

## 2021-01-07 MED ORDER — LIDOCAINE HCL (CARDIAC) PF 100 MG/5ML IV SOSY
PREFILLED_SYRINGE | INTRAVENOUS | Status: DC | PRN
  Administered 2021-01-07: 100 mg via INTRAVENOUS

## 2021-01-07 MED ORDER — PHENYLEPHRINE 40 MCG/ML (10ML) SYRINGE FOR IV PUSH (FOR BLOOD PRESSURE SUPPORT)
PREFILLED_SYRINGE | INTRAVENOUS | Status: DC | PRN
  Administered 2021-01-07: 80 ug via INTRAVENOUS

## 2021-01-07 SURGICAL SUPPLY — 22 items

## 2021-01-07 NOTE — Transfer of Care (Signed)
Immediate Anesthesia Transfer of Care Note  Patient: Roger Mendoza.  Procedure(s) Performed: COLONOSCOPY WITH PROPOFOL POLYPECTOMY  Patient Location: Endoscopy Unit  Anesthesia Type:MAC  Level of Consciousness: awake, alert , oriented and patient cooperative  Airway & Oxygen Therapy: Patient Spontanous Breathing and Patient connected to face mask oxygen  Post-op Assessment: Report given to RN and Post -op Vital signs reviewed and stable  Post vital signs: Reviewed and stable  Last Vitals:  Vitals Value Taken Time  BP 119/59 1044  Temp    Pulse 84   Resp    SpO2 96%     Last Pain:  Vitals:   01/07/21 0816  TempSrc: Oral  PainSc: 0-No pain         Complications: No notable events documented.

## 2021-01-07 NOTE — Op Note (Signed)
[facility] Patient Name: Roger Mendoza Procedure Date: 01/07/2021 MRN: [id number] Attending MD: Jerene Bears , MD Date of Birth: [birth year] CSN: [id number] Age: 60 Admit Type: Outpatient Procedure:                Colonoscopy Indications:              High risk colon cancer surveillance: Personal                            history of multiple adenomas, Last colonoscopy:                            June 2019 (10 adenomas all subcentimeter at last                            exam) Providers:                Lajuan Lines. Hilarie Fredrickson, MD, Ervin Knack, [place], Technician, Tyna Jaksch Technician Referring MD:             [name] Medicines:                Monitored Anesthesia Care Complications:            No immediate complications. Estimated Blood Loss:     Estimated blood loss was minimal. Procedure:                Pre-Anesthesia Assessment:                           - Prior to the procedure, a History and Physical                            was performed, and patient medications and                            allergies were reviewed. The patient's tolerance of                            previous anesthesia was also reviewed. The risks                            and benefits of the procedure and the sedation                            options and risks were discussed with the patient.                            All questions were answered, and informed consent                            was obtained. Prior Anticoagulants: The patient has  taken no previous anticoagulant or antiplatelet                            agents. ASA Grade Assessment: III - A patient with                            severe systemic disease. After reviewing the risks                            and benefits, the patient was deemed in                            satisfactory condition to undergo the procedure.                            After obtaining informed consent, the colonoscope                            was passed under direct vision. Throughout the                            procedure, the patient's blood pressure, pulse, and                            oxygen saturations were monitored continuously. The                            CF-HQ190L ([id number]) Olympus colonoscope was                            introduced through the anus and advanced to the                            cecum, identified by appendiceal orifice and                            ileocecal valve. The colonoscopy was performed                            without difficulty. The patient tolerated the                            procedure well. The quality of the bowel                            preparation was good. The ileocecal valve,                            appendiceal orifice, and rectum were photographed. Scope In: 10:21:15 AM Scope Out: 10:38:48 AM Scope Withdrawal Time: 0 hours 15 minutes 4 seconds  Total Procedure Duration: 0 hours 17 minutes 33 seconds  Findings:      The digital rectal exam was normal.      Two sessile polyps were found in the cecum and ileocecal valve. The  polyps were 1 to 2 mm in size. These polyps were removed with a cold       biopsy forceps. Resection and retrieval were complete.      Two sessile polyps were found in the descending colon and transverse       colon. The polyps were 4 to 5 mm in size. These polyps were removed with       a cold snare. Resection and retrieval were complete.      A 4 mm polyp was found in the anus near the dentate line. The polyp was       sessile. This was biopsied with a cold forceps for histology to exclude       AIN.      Internal hemorrhoids were found during retroflexion. The hemorrhoids       were medium-sized. Impression:               - Two 1 to 2 mm polyps in the cecum and at the                            ileocecal valve, removed with a cold biopsy                             forceps. Resected and retrieved.                           - Two 4 to 5 mm polyps in the descending colon and                            in the transverse colon, removed with a cold snare.                            Resected and retrieved.                           - One 4 mm polyp at the anus. Biopsied.                           - Internal hemorrhoids. Moderate Sedation:      N/A Recommendation:           - Patient has a contact number available for                            emergencies. The signs and symptoms of potential                            delayed complications were discussed with the                            patient. Return to normal activities tomorrow.                            Written discharge instructions were provided to the                            patient.                           -  Resume previous diet.                           - Continue present medications.                           - Await pathology results.                           - Repeat colonoscopy is recommended for                            surveillance. The colonoscopy date will be                            determined after pathology results from today's                            exam become available for review. Procedure Code(s):        --- Professional ---                           [phone], Colonoscopy, flexible; with removal of                            tumor(s), polyp(s), or other lesion(s) by snare                            technique                           45380, 17, Colonoscopy, flexible; with biopsy,                            single or multiple Diagnosis Code(s):        --- Professional ---                           Z86.010, Personal history of colonic polyps                           K63.5, Polyp of colon                           K62.0, Anal polyp                           K64.8, Other hemorrhoids CPT copyright 2019 American Medical Association. All rights  reserved. The codes documented in this report are preliminary and upon coder review may  be revised to meet current compliance requirements. Jerene Bears, MD 01/07/2021 10:50:34 AM This report has been signed electronically. Number of Addenda: 0

## 2021-01-07 NOTE — Anesthesia Preprocedure Evaluation (Signed)
Anesthesia Evaluation  Patient identified by MRN, date of birth, ID band Patient awake    Reviewed: Allergy & Precautions, NPO status , Patient's Chart, lab work & pertinent test results  History of Anesthesia Complications Negative for: history of anesthetic complications  Airway Mallampati: III  TM Distance: >3 FB Neck ROM: Full    Dental no notable dental hx. (+) Dental Advisory Given   Pulmonary sleep apnea , former smoker,  Pulmonary Mass   Pulmonary exam normal        Cardiovascular negative cardio ROS Normal cardiovascular exam  Study Highlights   The left ventricular ejection fraction is normal (55-65%).  Nuclear stress EF: 61%.       Neuro/Psych negative neurological ROS     GI/Hepatic negative GI ROS, Neg liver ROS,   Endo/Other  Morbid obesity  Renal/GU negative Renal ROS     Musculoskeletal negative musculoskeletal ROS (+)   Abdominal   Peds  Hematology negative hematology ROS (+)   Anesthesia Other Findings Day of surgery medications reviewed with the patient.  Reproductive/Obstetrics                             Anesthesia Physical  Anesthesia Plan  ASA: 3  Anesthesia Plan: MAC   Post-op Pain Management: Minimal or no pain anticipated   Induction: Intravenous  PONV Risk Score and Plan: 3  Airway Management Planned: Nasal Cannula and Natural Airway  Additional Equipment: None  Intra-op Plan:   Post-operative Plan:   Informed Consent: I have reviewed the patients History and Physical, chart, labs and discussed the procedure including the risks, benefits and alternatives for the proposed anesthesia with the patient or authorized representative who has indicated his/her understanding and acceptance.     Dental advisory given  Plan Discussed with: CRNA and Anesthesiologist  Anesthesia Plan Comments:         Anesthesia Quick Evaluation

## 2021-01-07 NOTE — Anesthesia Postprocedure Evaluation (Signed)
Anesthesia Post Note  Patient: Roger Mendoza.  Procedure(s) Performed: COLONOSCOPY WITH PROPOFOL POLYPECTOMY     Patient location during evaluation: PACU Anesthesia Type: MAC Level of consciousness: awake and alert Pain management: pain level controlled Vital Signs Assessment: post-procedure vital signs reviewed and stable Respiratory status: spontaneous breathing, nonlabored ventilation, respiratory function stable and patient connected to nasal cannula oxygen Cardiovascular status: stable and blood pressure returned to baseline Postop Assessment: no apparent nausea or vomiting Anesthetic complications: no   No notable events documented.  Last Vitals:  Vitals:   01/07/21 1100 01/07/21 1105  BP: 139/79   Pulse: 83 79  Resp: (!) 21 17  Temp:    SpO2: 96% 97%    Last Pain:  Vitals:   01/07/21 1046  TempSrc: Oral  PainSc: 0-No pain                 Reiana Poteet

## 2021-01-07 NOTE — H&P (Signed)
GASTROENTEROLOGY PROCEDURE H&P NOTE   Primary Care Physician: [name], MD    Reason for Procedure:  Personal history of multiple adenomatous colon polyps  Plan:    Surveillance colonoscopy  Patient is appropriate for endoscopic procedure(s) in the ambulatory ([place]) setting.  The nature of the procedure, as well as the risks, benefits, and alternatives were carefully and thoroughly reviewed with the patient. Ample time for discussion and questions allowed. The patient understood, was satisfied, and agreed to proceed.     HPI: Roger Mendoza. is a 60 y.o. male who presents for surveillance colonoscopy in the outpatient hospital setting due to personal history of difficult airway.  Last colonoscopy June 2019 with 10 adenomatous polyps removed.  He tolerated the prep though prefers liquid prep to the pill prep.  No abdominal pain today.  No recent issues with chest pain or shortness of breath.  He asked for refill of hemorrhoidal cream.  Past Medical History:  Diagnosis Date   Allergic rhinitis, seasonal    Allergy    seasonal   Cancer ([place])    lung -  7yrs ago- is not under any kind of treatment   Colon polyp 02/2012   tubular adenoma, Dr. Sharee Holster Quervain's tenosynovitis, right    hx/o   Epididymitis    hx/o as adult   Erectile dysfunction    Family history of premature CAD    father   Heart murmur    Hemorrhoids    hx of   Hyperlipidemia    Left inguinal hernia    tiny as of 03/2014   MRSA (methicillin resistant staph aureus) culture positive    history of MRSA skin on pt's back/ unsure of date   Normal cardiac stress test    remote past per patient, [place],    Obesity    OSA on CPAP    Recurrent UTI    Dr. Janice Norrie, [facility]   Sleep apnea    uses c-pap   Tinea pedis    Tobacco use    4 cigarrettes daily   Wears glasses     Past Surgical History:  Procedure Laterality Date   ADENOIDECTOMY     CARDIAC CATHETERIZATION   02/11/2005   COLONOSCOPY  02/2012   Dr. Zenovia Jarred, repeat 02/2017   ESOPHAGEAL DILATION     INTERCOSTAL NERVE BLOCK Right 02/28/2019   Procedure: Intercostal Nerve Block;  Surgeon: Lajuana Matte, MD;  Location: [place];  Service: Thoracic;  Laterality: Right;   NODE DISSECTION Right 02/28/2019   Procedure: Node Dissection;  Surgeon: Lajuana Matte, MD;  Location: [place];  Service: Thoracic;  Laterality: Right;   [place]   left side   SKIN SURGERY     large area of MRSA on back/ over 10 years ago   [place] N/A 02/28/2019   Procedure: VIDEO BRONCHOSCOPY;  Surgeon: Lajuana Matte, MD;  Location: [place];  Service: Thoracic;  Laterality: N/A;    Prior to Admission medications   Medication Sig Start Date End Date Taking? Authorizing Provider  acetaminophen (TYLENOL) 325 MG tablet Take 650 mg by mouth every 6 (six) hours as needed.   Yes [provider]  ASPIRIN LOW DOSE 81 MG EC tablet TAKE 1 TABLET BY MOUTH EVERY DAY 03/27/20  Yes [name], MD  Glycerin-Hypromellose-PEG 400 (VISINE DRY EYE)  0.2-0.2-1 % SOLN Place 1-2 drops into both eyes 3 (three) times daily as needed (dry/irritated eyes).   Yes [provider]  meloxicam (MOBIC) 7.5 MG tablet TAKE 1 TABLET BY MOUTH EVERY DAY AS NEEDED FOR PAIN 12/23/20  Yes Gregor Hams, MD  NON FORMULARY C PAP   Yes [provider]  sildenafil (REVATIO) 20 MG tablet TAKE 1 TO 5 TABLETS BY MOUTH DAILY AS NEEDED PRIOR TO SEXUAL ACTIVITY 05/11/20  Yes [name], MD  Sodium Sulfate-Mag Sulfate-KCl (SUTAB) [phone] MG TABS Take 12 tablets by mouth as directed. 11/04/20  Yes Jentri Aye, Lajuan Lines, MD  tiZANidine (ZANAFLEX) 2 MG tablet TAKE 1 TO 2 TABLETS BY MOUTH AT BEDTIME AS NEEDED FOR MUSCLE SPASMS 12/24/20  Yes Gregor Hams, MD  traMADol (ULTRAM) 50 MG tablet Take 50 mg by mouth every 6 (six) hours as needed (pain). 12/09/20  Yes  [provider]  Vitamin D, Ergocalciferol, (DRISDOL) 1.25 MG (50000 UNIT) CAPS capsule Take 1 capsule (50,000 Units total) by mouth every 7 (seven) days. 08/26/20  Yes [name], MD  hydrocortisone (PROCTOSOL HC) 2.5 % rectal cream Place 1 application rectally 2 (two) times daily. Patient taking differently: Place 1 application rectally 2 (two) times daily as needed for hemorrhoids. 02/12/18   [name], MD  predniSONE (DELTASONE) 20 MG tablet Take 2 tablets (40 mg total) by mouth daily. Patient not taking: Reported on 12/31/2020 12/07/20   Hans Eden, NP    Current Facility-Administered Medications  Medication Dose Route Frequency Provider Last Rate Last Admin   lactated ringers infusion   Intravenous Continuous Nainoa Woldt, Lajuan Lines, MD 10 mL/hr at 01/07/21 0834 1,000 mL at 01/07/21 0834    Allergies as of 10/29/2020   (No Known Allergies)    Family History  Problem Relation Age of Onset   Hypertension Mother    Arthritis Mother    Hip dysplasia Mother    Heart disease Mother    Heart disease Father 63       died of MI   Hyperlipidemia Father    HIV Brother    Colon cancer Neg Hx    Esophageal cancer Neg Hx    Rectal cancer Neg Hx    Stomach cancer Neg Hx    Cancer Neg Hx    Diabetes Neg Hx    Stroke Neg Hx    Colitis Neg Hx     Social History   Socioeconomic History   Marital status: Married    Spouse name: Not on file   Number of children: Not on file   Years of education: Not on file   Highest education level: Not on file  Occupational History   Occupation: loads truck  Tobacco Use   Smoking status: Former    Packs/day: 0.25    Years: 15.00    Pack years: 3.75    Types: Cigarettes    Quit date: 03/31/2014    Years since quitting: 6.7   Smokeless tobacco: Never  Vaping Use   Vaping Use: Never used  Substance and Sexual Activity   Alcohol use: Yes    Alcohol/week: 15.0 standard drinks    Types: 3 Glasses of wine, 12 Cans of beer per  week    Comment: couple glasses of wine per day   Drug use: No   Sexual activity: Not on file  Other Topics Concern   Not on file  Social History Narrative   Married, has 73yo son, works  for [employer] at [employer], loading trucks, exercise - walks a lot at work, diet - has cut back on sweets, bread and greasy foods.  Plays golf, used to play softball.  Attends church, deacon.  04/2016   Social Determinants of Health   Financial Resource Strain: Not on file  Food Insecurity: Not on file  Transportation Needs: Not on file  Physical Activity: Not on file  Stress: Not on file  Social Connections: Not on file  Intimate Partner Violence: Not on file    Physical Exam: Vital signs in last 24 hours: @BP  135/70    Pulse 81    Temp 98.4 F (36.9 C) (Oral)    Resp 13    Ht 6\' 1"  (1.854 m)    Wt 136.1 kg    SpO2 97%    BMI 39.58 kg/m  GEN: NAD EYE: Sclerae anicteric ENT: MMM CV: Non-tachycardic Pulm: CTA b/l GI: Soft, NT/ND NEURO:  Alert & Oriented x 3   Zenovia Jarred, MD [facility]  01/07/2021 9:59 AM

## 2021-01-08 LAB — SURGICAL PATHOLOGY

## 2021-01-11 ENCOUNTER — Encounter: Payer: Self-pay | Admitting: Internal Medicine

## 2021-01-21 ENCOUNTER — Ambulatory Visit
Admission: RE | Admit: 2021-01-21 | Discharge: 2021-01-21 | Disposition: A | Discharge status: Home / Self Care | Payer: 59 | Source: Ambulatory Visit | Attending: Thoracic Surgery (Cardiothoracic Vascular Surgery) | Admitting: Thoracic Surgery (Cardiothoracic Vascular Surgery)

## 2021-01-21 DIAGNOSIS — C3490 Malignant neoplasm of unspecified part of unspecified bronchus or lung: Secondary | ICD-10-CM

## 2021-01-26 ENCOUNTER — Other Ambulatory Visit: Payer: 59

## 2021-01-27 ENCOUNTER — Other Ambulatory Visit: Payer: Self-pay | Admitting: Family Medicine

## 2021-01-27 ENCOUNTER — Other Ambulatory Visit: Payer: 59

## 2021-01-28 ENCOUNTER — Other Ambulatory Visit: Payer: Self-pay

## 2021-02-12 ENCOUNTER — Telehealth: Payer: 59 | Admitting: Thoracic Surgery (Cardiothoracic Vascular Surgery)

## 2021-02-12 ENCOUNTER — Other Ambulatory Visit: Payer: Self-pay

## 2021-02-24 ENCOUNTER — Other Ambulatory Visit: Payer: Self-pay | Admitting: Family Medicine

## 2021-02-24 DIAGNOSIS — M25532 Pain in left wrist: Secondary | ICD-10-CM

## 2021-02-24 DIAGNOSIS — M79645 Pain in left finger(s): Secondary | ICD-10-CM

## 2021-02-24 DIAGNOSIS — G8929 Other chronic pain: Secondary | ICD-10-CM

## 2021-03-08 ENCOUNTER — Encounter: Payer: Self-pay | Admitting: *Deleted

## 2021-03-08 ENCOUNTER — Other Ambulatory Visit: Payer: Self-pay

## 2021-03-08 ENCOUNTER — Inpatient Hospital Stay: Payer: 59 | Admitting: Internal Medicine

## 2021-03-08 ENCOUNTER — Inpatient Hospital Stay: Payer: 59 | Attending: Internal Medicine

## 2021-03-08 VITALS — BP 143/76 | HR 90 | Temp 97.3°F | Resp 20 | Ht 73.0 in | Wt 322.2 lb

## 2021-03-08 DIAGNOSIS — Z902 Acquired absence of lung [part of]: Secondary | ICD-10-CM | POA: Diagnosis not present

## 2021-03-08 DIAGNOSIS — Z85118 Personal history of other malignant neoplasm of bronchus and lung: Secondary | ICD-10-CM | POA: Insufficient documentation

## 2021-03-08 DIAGNOSIS — Z87891 Personal history of nicotine dependence: Secondary | ICD-10-CM | POA: Insufficient documentation

## 2021-03-08 DIAGNOSIS — R9389 Abnormal findings on diagnostic imaging of other specified body structures: Secondary | ICD-10-CM

## 2021-03-08 DIAGNOSIS — C3490 Malignant neoplasm of unspecified part of unspecified bronchus or lung: Secondary | ICD-10-CM

## 2021-03-08 LAB — CBC WITH DIFFERENTIAL (CANCER CENTER ONLY)
Abs Immature Granulocytes: 0.12 10*3/uL — ABNORMAL HIGH (ref 0.00–0.07)
Basophils Absolute: 0 10*3/uL (ref 0.0–0.1)
Basophils Relative: 0 %
Eosinophils Absolute: 0.3 10*3/uL (ref 0.0–0.5)
Eosinophils Relative: 3 %
HCT: 42.2 % (ref 39.0–52.0)
Hemoglobin: 14.4 g/dL (ref 13.0–17.0)
Immature Granulocytes: 1 %
Lymphocytes Relative: 31 %
Lymphs Abs: 3.1 10*3/uL (ref 0.7–4.0)
MCH: 29.9 pg (ref 26.0–34.0)
MCHC: 34.1 g/dL (ref 30.0–36.0)
MCV: 87.6 fL (ref 80.0–100.0)
Monocytes Absolute: 0.6 10*3/uL (ref 0.1–1.0)
Monocytes Relative: 6 %
Neutro Abs: 5.9 10*3/uL (ref 1.7–7.7)
Neutrophils Relative %: 59 %
Platelet Count: 299 10*3/uL (ref 150–400)
RBC: 4.82 MIL/uL (ref 4.22–5.81)
RDW: 12 % (ref 11.5–15.5)
WBC Count: 10.1 10*3/uL (ref 4.0–10.5)
nRBC: 0 % (ref 0.0–0.2)

## 2021-03-08 LAB — CMP (CANCER CENTER ONLY)
ALT: 27 U/L (ref 0–44)
AST: 19 U/L (ref 15–41)
Albumin: 5 g/dL (ref 3.5–5.0)
Alkaline Phosphatase: 74 U/L (ref 38–126)
Anion gap: 8 (ref 5–15)
BUN: 10 mg/dL (ref 8–23)
CO2: 26 mmol/L (ref 22–32)
Calcium: 9.6 mg/dL (ref 8.9–10.3)
Chloride: 106 mmol/L (ref 98–111)
Creatinine: 1.03 mg/dL (ref 0.61–1.24)
GFR, Estimated: >60 mL/min (ref >60)
Glucose, Bld: 107 mg/dL — ABNORMAL HIGH (ref 70–99)
Potassium: 3.7 mmol/L (ref 3.5–5.1)
Sodium: 140 mmol/L (ref 135–145)
Total Bilirubin: 0.6 mg/dL (ref 0.3–1.2)
Total Protein: 8.3 g/dL — ABNORMAL HIGH (ref 6.5–8.1)

## 2021-03-08 NOTE — Progress Notes (Signed)
Oncology Nurse Navigator Documentation  Oncology Nurse Navigator Flowsheets 03/08/2021  Navigator Follow Up Date: 02/24/2022  Navigator Follow Up Reason: Follow-up Appointment  Navigator Location [facility]  Navigator Encounter Type Clinic/MDC;Initial MedOnc  Patient Visit Type Initial;MedOnc  Treatment Phase Other  Barriers/Navigation Needs Education/I met Roger Mendoza at his first appt to see med onc.  Patient has hx lung cancer and was tx with surgery 2 years ago.  Patient was referred to med onc to be followed with scan. Treatment plan will be follow up in 1 year with scan. I help to explain this to patient. He is wanting to lose weight and spoke to Dr. Julien Nordmann. He asked that the patient contact PCP and if patient would be a candidate for weight loss medication.  I added information of medication to AVS.   Education Other  Interventions Education;Psycho-Social Support  Acuity Level 2-Minimal Needs (1-2 Barriers Identified)  Education Method Verbal;Other  Time Spent with Patient 30

## 2021-03-08 NOTE — Progress Notes (Signed)
[facility] Telephone:([phone]   Fax:(336) [phone]  CONSULT NOTE  REFERRING PHYSICIAN: Dr. Melodie Bouillon  REASON FOR CONSULTATION:  61 years old African-American male diagnosed with lung cancer.  HPI Roger Mendoza. is a 61 y.o. male with past medical history significant for colon polyps, obstructive sleep apnea, recurrent urinary tract infection, epididymitis, heart murmur, left inguinal hernia repair as well as long history of his smoking.  The patient mentioned that in December 2020 he was complaining of right flank pain.  He had CT renal performed on January 08, 2019 and that showed incidental finding of right lower lobe pulmonary nodule measuring 1.7 x 1.3 cm.  This was followed by CT scan of the chest on January 21, 2019 and that showed 2.4 x 2.0 cm mixed cystic and solid lesion in the right lower lobe.  A PET scan was performed on February 15, 2019 and it showed no FDG uptake in this lesion but was still suspicious for bronchogenic carcinoma. The patient was referred to Dr. Kipp Brood and on February 28, 2019 he underwent right lower lobectomy with lymph node sampling.  The final pathology ([id number]) showed 1.4 cm adenocarcinoma with no evidence for visceral pleural or lymphovascular invasion.  The dissected lymph nodes were negative for malignancy. The patient was followed by observation by Dr. Kipp Brood for the last 2 years and the most recent CT scan of the chest in December 2022 showed no evidence for disease recurrence or metastasis. Dr. Kipp Brood referred the patient to me today for continuous monitoring and management of his condition. When seen today he continues to have the chronic back pain as well as shortness of breath with exertion but no significant chest pain, cough or hemoptysis.  He has no nausea, vomiting, diarrhea or constipation.  He has no headache or visual changes.  He denied having any weight loss or night sweats. Family  history significant for mother with hypertension and heart disease.  Father had heart disease and dyslipidemia.  Brother died from AIDS. The patient is married and has 1 son and 2 grand children.  He works for [employer].  He has a history of smoking but quit long time ago.  He drinks around 12 beers every week and no history of drug abuse.    HPI  Past Medical History:  Diagnosis Date   Allergic rhinitis, seasonal    Allergy    seasonal   Cancer ([place])    lung -  68yrs ago- is not under any kind of treatment   Colon polyp 02/2012   tubular adenoma, Dr. Sharee Holster Quervain's tenosynovitis, right    hx/o   Epididymitis    hx/o as adult   Erectile dysfunction    Family history of premature CAD    father   Heart murmur    Hemorrhoids    hx of   Hyperlipidemia    Left inguinal hernia    tiny as of 03/2014   MRSA (methicillin resistant staph aureus) culture positive    history of MRSA skin on pt's back/ unsure of date   Normal cardiac stress test    remote past per patient, [place], [place]   Obesity    OSA on CPAP    Recurrent UTI    Dr. Janice Norrie, [facility]   Sleep apnea    uses c-pap   Tinea pedis    Tobacco use    4 cigarrettes daily   Wears glasses  Past Surgical History:  Procedure Laterality Date   ADENOIDECTOMY     BIOPSY  01/07/2021   Procedure: BIOPSY;  Surgeon: Jerene Bears, MD;  Location: [facility];  Service: Gastroenterology;;   CARDIAC CATHETERIZATION  02/11/2005   COLONOSCOPY  02/2012   Dr. Zenovia Jarred, repeat 02/2017   COLONOSCOPY WITH PROPOFOL N/A 01/07/2021   Procedure: COLONOSCOPY WITH PROPOFOL;  Surgeon: Jerene Bears, MD;  Location: [facility];  Service: Gastroenterology;  Laterality: N/A;   ESOPHAGEAL DILATION     INTERCOSTAL NERVE BLOCK Right 02/28/2019   Procedure: Intercostal Nerve Block;  Surgeon: Lajuana Matte, MD;  Location: [facility];  Service: Thoracic;  Laterality: Right;   NODE DISSECTION Right 02/28/2019   Procedure: Node  Dissection;  Surgeon: Lajuana Matte, MD;  Location: [facility];  Service: Thoracic;  Laterality: Right;   POLYPECTOMY  01/07/2021   Procedure: POLYPECTOMY;  Surgeon: Jerene Bears, MD;  Location: [facility];  Service: Gastroenterology;;   [place]   left side   SKIN SURGERY     large area of MRSA on back/ over 10 years ago   TONSILLECTOMY     UPPER GASTROINTESTINAL ENDOSCOPY     VIDEO BRONCHOSCOPY N/A 02/28/2019   Procedure: VIDEO BRONCHOSCOPY;  Surgeon: Lajuana Matte, MD;  Location: [facility];  Service: Thoracic;  Laterality: N/A;    Family History  Problem Relation Age of Onset   Hypertension Mother    Arthritis Mother    Hip dysplasia Mother    Heart disease Mother    Heart disease Father 22       died of MI   Hyperlipidemia Father    HIV Brother    Colon cancer Neg Hx    Esophageal cancer Neg Hx    Rectal cancer Neg Hx    Stomach cancer Neg Hx    Cancer Neg Hx    Diabetes Neg Hx    Stroke Neg Hx    Colitis Neg Hx     Social History Social History   Tobacco Use   Smoking status: Former    Packs/day: 0.25    Years: 15.00    Pack years: 3.75    Types: Cigarettes    Quit date: 03/31/2014    Years since quitting: 6.9   Smokeless tobacco: Never  Vaping Use   Vaping Use: Never used  Substance Use Topics   Alcohol use: Yes    Alcohol/week: 15.0 standard drinks    Types: 3 Glasses of wine, 12 Cans of beer per week    Comment: couple glasses of wine per day   Drug use: No    No Known Allergies  Current Outpatient Medications  Medication Sig Dispense Refill   acetaminophen (TYLENOL) 325 MG tablet Take 650 mg by mouth every 6 (six) hours as needed.     ASPIRIN LOW DOSE 81 MG EC tablet TAKE 1 TABLET BY MOUTH EVERY DAY 90 tablet 3   Glycerin-Hypromellose-PEG 400 (VISINE DRY EYE) 0.2-0.2-1 % SOLN Place 1-2 drops into both eyes 3 (three) times daily as needed (dry/irritated eyes).     hydrocortisone (PROCTOSOL HC) 2.5 % rectal cream Place 1  application rectally 2 (two) times daily. (Patient taking differently: Place 1 application rectally 2 (two) times daily as needed for hemorrhoids.) 28.35 g 0   meloxicam (MOBIC) 7.5 MG tablet TAKE 1 TABLET BY MOUTH EVERY DAY AS NEEDED FOR PAIN 30 tablet 0   NON FORMULARY C PAP  pramoxine-hydrocortisone (ANALPRAM HC) cream Apply topically 3 (three) times daily. 30 g 1   sildenafil (REVATIO) 20 MG tablet TAKE 1 TO 5 TABLETS BY MOUTH DAILY AS NEEDED PRIOR TO SEXUAL ACTIVITY 50 tablet 2   tiZANidine (ZANAFLEX) 2 MG tablet TAKE 1 TO 2 TABLETS BY MOUTH AT BEDTIME AS NEEDED FOR MUSCLE SPASMS 180 tablet 1   traMADol (ULTRAM) 50 MG tablet Take 50 mg by mouth every 6 (six) hours as needed (pain).     Vitamin D, Ergocalciferol, (DRISDOL) 1.25 MG (50000 UNIT) CAPS capsule Take 1 capsule (50,000 Units total) by mouth every 7 (seven) days. 12 capsule 0   No current facility-administered medications for this visit.    Review of Systems  Constitutional: positive for fatigue Eyes: negative Ears, nose, mouth, throat, and face: negative Respiratory: positive for dyspnea on exertion Cardiovascular: negative Gastrointestinal: negative Genitourinary:negative Integument/breast: negative Hematologic/lymphatic: negative Musculoskeletal:positive for back pain Neurological: negative Behavioral/Psych: negative Endocrine: negative Allergic/Immunologic: negative  Physical Exam  RCB:ULAGT, healthy, no distress, well nourished, and well developed SKIN: skin color, texture, turgor are normal, no rashes or significant lesions HEAD: Normocephalic, No masses, lesions, tenderness or abnormalities EYES: normal, PERRLA, Conjunctiva are pink and non-injected EARS: External ears normal, Canals clear OROPHARYNX:no exudate, no erythema, and lips, buccal mucosa, and tongue normal  NECK: supple, no adenopathy, no JVD LYMPH:  no palpable lymphadenopathy, no hepatosplenomegaly LUNGS: clear to auscultation , and  palpation HEART: regular rate & rhythm, no murmurs, and no gallops ABDOMEN:abdomen soft, non-tender, obese, normal bowel sounds, and no masses or organomegaly BACK: Back symmetric, no curvature., No CVA tenderness EXTREMITIES:no joint deformities, effusion, or inflammation, no edema  NEURO: alert & oriented x 3 with fluent speech, no focal motor/sensory deficits  PERFORMANCE STATUS: ECOG 1  LABORATORY DATA: Lab Results  Component Value Date   WBC 10.1 03/08/2021   HGB 14.4 03/08/2021   HCT 42.2 03/08/2021   MCV 87.6 03/08/2021   PLT 299 03/08/2021      Chemistry      Component Value Date/Time   NA 141 08/21/2020 0934   K 4.1 08/21/2020 0934   CL 104 08/21/2020 0934   CO2 26 08/21/2020 0934   BUN 12 08/21/2020 0934   CREATININE 1.03 08/21/2020 0934   CREATININE 1.15 05/11/2015 0001      Component Value Date/Time   CALCIUM 9.7 08/21/2020 0934   ALKPHOS 74 08/21/2020 0934   AST 19 08/21/2020 0934   ALT 29 08/21/2020 0934   BILITOT 0.7 08/21/2020 0934       RADIOGRAPHIC STUDIES: No results found.  ASSESSMENT: This is a very pleasant 61 years old African-American male diagnosed with stage Ia (T1b, N0, M0) non-small cell lung cancer, adenocarcinoma status post right lower lobectomy with lymph node sampling under the care of Dr. Kipp Brood on February 28, 2019   PLAN: I had a lengthy discussion with the patient today about his current disease stage, prognosis and treatment options. I explained to the patient that he had curable treatment for his condition with the surgical resection and there was no benefit for any additional adjuvant chemotherapy or radiation and he already has been on observation for the last 2 years after his surgical resection. I recommended for the patient to continue on observation with repeat CT scan of the chest in 1 year. I will see him back for follow-up visit at that time. I also strongly encouraged the patient to cut his alcohol drinking. The  patient was advised to call immediately if  he has any concerning symptoms in the interval. The patient voices understanding of current disease status and treatment options and is in agreement with the current care plan.  All questions were answered. The patient knows to call the clinic with any problems, questions or concerns. We can certainly see the patient much sooner if necessary.  Thank you so much for allowing me to participate in the care of [name]. I will continue to follow up the patient with you and assist in his care.  The total time spent in the appointment was 60 minutes.  Disclaimer: This note was dictated with voice recognition software. Similar sounding words can inadvertently be transcribed and may not be corrected upon review.   Eilleen Kempf March 08, 2021, 2:24 PM

## 2021-03-08 NOTE — Patient Instructions (Signed)
Semaglutide Injection What is this medication? SEMAGLUTIDE (SEM a GLOO tide) treats type 2 diabetes. It works by increasing insulin levels in your body, which decreases your blood sugar (glucose). It also reduces the amount of sugar released into the blood and slows down your digestion. It can also be used to lower the risk of heart attack and stroke in people with type 2 diabetes. Changes to diet and exercise are often combined with this medication. This medicine may be used for other purposes; ask your health care provider or pharmacist if you have questions. COMMON BRAND NAME(S): OZEMPIC What should I tell my care team before I take this medication? They need to know if you have any of these conditions: Endocrine tumors (MEN 2) or if someone in your family had these tumors Eye disease, vision problems History of pancreatitis Kidney disease Stomach problems Thyroid cancer or if someone in your family had thyroid cancer An unusual or allergic reaction to semaglutide, other medications, foods, dyes, or preservatives Pregnant or trying to get pregnant Breast-feeding How should I use this medication? This medication is for injection under the skin of your upper leg (thigh), stomach area, or upper arm. It is given once every week (every 7 days). You will be taught how to prepare and give this medication. Use exactly as directed. Take your medication at regular intervals. Do not take it more often than directed. If you use this medication with insulin, you should inject this medication and the insulin separately. Do not mix them together. Do not give the injections right next to each other. Change (rotate) injection sites with each injection. It is important that you put your used needles and syringes in a special sharps container. Do not put them in a trash can. If you do not have a sharps container, call your pharmacist or care team to get one. A special MedGuide will be given to you by the  pharmacist with each prescription and refill. Be sure to read this information carefully each time. This medication comes with INSTRUCTIONS FOR USE. Ask your pharmacist for directions on how to use this medication. Read the information carefully. Talk to your pharmacist or care team if you have questions. Talk to your care team about the use of this medication in children. Special care may be needed. Overdosage: If you think you have taken too much of this medicine contact a poison control center or emergency room at once. NOTE: This medicine is only for you. Do not share this medicine with others. What if I miss a dose? If you miss a dose, take it as soon as you can within 5 days after the missed dose. Then take your next dose at your regular weekly time. If it has been longer than 5 days after the missed dose, do not take the missed dose. Take the next dose at your regular time. Do not take double or extra doses. If you have questions about a missed dose, contact your care team for advice. What may interact with this medication? Other medications for diabetes Many medications may cause changes in blood sugar, these include: Alcohol containing beverages Antiviral medications for HIV or AIDS Aspirin and aspirin-like medications Certain medications for blood pressure, heart disease, irregular heart beat Chromium Diuretics Male hormones, such as estrogens or progestins, birth control pills Fenofibrate Gemfibrozil Isoniazid Lanreotide Male hormones or anabolic steroids MAOIs like Carbex, Eldepryl, Marplan, Nardil, and Parnate Medications for weight loss Medications for allergies, asthma, cold, or cough Medications for depression,  anxiety, or psychotic disturbances Niacin Nicotine NSAIDs, medications for pain and inflammation, like ibuprofen or naproxen Octreotide Pasireotide Pentamidine Phenytoin Probenecid Quinolone antibiotics such as ciprofloxacin, levofloxacin, ofloxacin Some  herbal dietary supplements Steroid medications such as prednisone or cortisone Sulfamethoxazole; trimethoprim Thyroid hormones Some medications can hide the warning symptoms of low blood sugar (hypoglycemia). You may need to monitor your blood sugar more closely if you are taking one of these medications. These include: Beta-blockers, often used for high blood pressure or heart problems (examples include atenolol, metoprolol, propranolol) Clonidine Guanethidine Reserpine This list may not describe all possible interactions. Give your health care provider a list of all the medicines, herbs, non-prescription drugs, or dietary supplements you use. Also tell them if you smoke, drink alcohol, or use illegal drugs. Some items may interact with your medicine. What should I watch for while using this medication? Visit your care team for regular checks on your progress. Drink plenty of fluids while taking this medication. Check with your care team if you get an attack of severe diarrhea, nausea, and vomiting. The loss of too much body fluid can make it dangerous for you to take this medication. A test called the HbA1C (A1C) will be monitored. This is a simple blood test. It measures your blood sugar control over the last 2 to 3 months. You will receive this test every 3 to 6 months. Learn how to check your blood sugar. Learn the symptoms of low and high blood sugar and how to manage them. Always carry a quick-source of sugar with you in case you have symptoms of low blood sugar. Examples include hard sugar candy or glucose tablets. Make sure others know that you can choke if you eat or drink when you develop serious symptoms of low blood sugar, such as seizures or unconsciousness. They must get medical help at once. Tell your care team if you have high blood sugar. You might need to change the dose of your medication. If you are sick or exercising more than usual, you might need to change the dose of your  medication. Do not skip meals. Ask your care team if you should avoid alcohol. Many nonprescription cough and cold products contain sugar or alcohol. These can affect blood sugar. Pens should never be shared. Even if the needle is changed, sharing may result in passing of viruses like hepatitis or HIV. Wear a medical ID bracelet or chain, and carry a card that describes your disease and details of your medication and dosage times. Do not become pregnant while taking this medication. Women should inform their care team if they wish to become pregnant or think they might be pregnant. There is a potential for serious side effects to an unborn child. Talk to your care team for more information. What side effects may I notice from receiving this medication? Side effects that you should report to your care team as soon as possible: Allergic reactions--skin rash, itching, hives, swelling of the face, lips, tongue, or throat Change in vision Dehydration--increased thirst, dry mouth, feeling faint or lightheaded, headache, dark yellow or brown urine Gallbladder problems--severe stomach pain, nausea, vomiting, fever Heart palpitations--rapid, pounding, or irregular heartbeat Kidney injury--decrease in the amount of urine, swelling of the ankles, hands, or feet Pancreatitis--severe stomach pain that spreads to your back or gets worse after eating or when touched, fever, nausea, vomiting Thyroid cancer--new mass or lump in the neck, pain or trouble swallowing, trouble breathing, hoarseness Side effects that usually do not require medical  attention (report to your care team if they continue or are bothersome): Diarrhea Loss of appetite Nausea Stomach pain Vomiting This list may not describe all possible side effects. Call your doctor for medical advice about side effects. You may report side effects to FDA at 1-800-FDA-1088. Where should I keep my medication? Keep out of the reach of children. Store  unopened pens in a refrigerator between 2 and 8 degrees C (36 and 46 degrees F). Do not freeze. Protect from light and heat. After you first use the pen, it can be stored for 56 days at room temperature between 15 and 30 degrees C (59 and 86 degrees F) or in a refrigerator. Throw away your used pen after 56 days or after the expiration date, whichever comes first. Do not store your pen with the needle attached. If the needle is left on, medication may leak from the pen. NOTE: This sheet is a summary. It may not cover all possible information. If you have questions about this medicine, talk to your doctor, pharmacist, or health care provider.  2022 Elsevier/Gold Standard (2020-04-16 00:00:00)

## 2021-03-12 ENCOUNTER — Other Ambulatory Visit: Payer: Self-pay | Admitting: Thoracic Surgery (Cardiothoracic Vascular Surgery)

## 2021-03-12 DIAGNOSIS — C3490 Malignant neoplasm of unspecified part of unspecified bronchus or lung: Secondary | ICD-10-CM

## 2021-04-02 ENCOUNTER — Other Ambulatory Visit: Payer: Self-pay | Admitting: Family Medicine

## 2021-04-02 DIAGNOSIS — M25532 Pain in left wrist: Secondary | ICD-10-CM

## 2021-04-02 DIAGNOSIS — G8929 Other chronic pain: Secondary | ICD-10-CM

## 2021-04-09 ENCOUNTER — Other Ambulatory Visit: Payer: Self-pay | Admitting: Family Medicine

## 2021-04-16 ENCOUNTER — Ambulatory Visit: Payer: 59 | Admitting: Thoracic Surgery (Cardiothoracic Vascular Surgery)

## 2021-04-16 ENCOUNTER — Ambulatory Visit
Admission: RE | Admit: 2021-04-16 | Discharge: 2021-04-16 | Disposition: A | Discharge status: Home / Self Care | Payer: 59 | Source: Ambulatory Visit | Attending: Thoracic Surgery (Cardiothoracic Vascular Surgery) | Admitting: Thoracic Surgery (Cardiothoracic Vascular Surgery)

## 2021-04-16 ENCOUNTER — Other Ambulatory Visit: Payer: Self-pay

## 2021-04-16 VITALS — BP 164/93 | HR 88 | Resp 20 | Ht 73.0 in | Wt 317.0 lb

## 2021-04-16 DIAGNOSIS — Z902 Acquired absence of lung [part of]: Secondary | ICD-10-CM

## 2021-04-16 DIAGNOSIS — C3490 Malignant neoplasm of unspecified part of unspecified bronchus or lung: Secondary | ICD-10-CM

## 2021-04-16 DIAGNOSIS — Z85118 Personal history of other malignant neoplasm of bronchus and lung: Secondary | ICD-10-CM | POA: Diagnosis not present

## 2021-04-16 NOTE — Progress Notes (Signed)
? ?   ?  [address] ?      [place] [address] ?            [phone]       ? ?[name] ?[facility] Record #[id number] ?Date of Birth: [birth year] ? ?Referring: [name], MD ?Primary Care: [name], MD ?Primary Cardiologist:None ? ?Reason for visit:   follow-up ? ?History of Present Illness:     ?[name] comes in in follow-up.  He underwent a robotic assisted right lower lobectomy in February 2021 for a stage Ia adenocarcinoma.  Overall he has been doing well.  He has no complaints today. ? ?Physical Exam: ?BP (!) 164/93 (BP Location: Right Arm, Patient Position: Sitting)   Pulse 88   Resp 20   Ht 6\' 1"  (1.854 m)   Wt (!) 317 lb (143.8 kg)   SpO2 94% Comment: RA  BMI 41.82 kg/m?  ? ?Alert NAD ?Abdomen, ND ?No peripheral edema ? ? ?Diagnostic Studies & Laboratory data: ?CT chest: ? IMPRESSION: ?1. Posttreatment changes within the RIGHT lung, without evidence of ?recurrent or metastatic disease within chest. ?2. Multivessel calcified coronary and aortic atherosclerosis. Aortic ?Atherosclerosis (ICD10-I70.0). ?3. Main pulmonary artery dilation, likely reflecting underlying ?pulmonary arterial hypertension. ? ?Assessment / Plan:   ?61 year old male status post right lower lobectomy for stage I adenocarcinoma.  Overall doing well.  There is no evidence of any recurrent or metastatic disease on his cross-sectional imaging from today.  His original surgery was in February 2021.  He will continue his annual surveillance CT scans.  He would like to continue to come to the clinic in person. ? ? ?Roger Mendoza ?04/16/2021 1:02 PM ? ? ? ? ? ? ?

## 2021-05-04 ENCOUNTER — Other Ambulatory Visit: Payer: Self-pay | Admitting: Family Medicine

## 2021-05-04 DIAGNOSIS — G8929 Other chronic pain: Secondary | ICD-10-CM

## 2021-05-04 DIAGNOSIS — M25532 Pain in left wrist: Secondary | ICD-10-CM

## 2021-05-19 ENCOUNTER — Ambulatory Visit: Payer: 59 | Admitting: Family Medicine

## 2021-05-19 ENCOUNTER — Encounter: Payer: Self-pay | Admitting: Family Medicine

## 2021-05-19 VITALS — BP 146/76 | HR 102 | Temp 98.9°F | Wt 324.0 lb

## 2021-05-19 DIAGNOSIS — N390 Urinary tract infection, site not specified: Secondary | ICD-10-CM | POA: Diagnosis not present

## 2021-05-19 DIAGNOSIS — R109 Unspecified abdominal pain: Secondary | ICD-10-CM

## 2021-05-19 LAB — POC URINALSYSI DIPSTICK (AUTOMATED)
Bilirubin, UA: NEGATIVE
Blood, UA: NEGATIVE
Glucose, UA: NEGATIVE
Ketones, UA: NEGATIVE
Leukocytes, UA: NEGATIVE
Nitrite, UA: NEGATIVE
Protein, UA: NEGATIVE
Spec Grav, UA: 1.025 (ref 1.010–1.025)
Urobilinogen, UA: 0.2 E.U./dL
pH, UA: 5.5 (ref 5.0–8.0)

## 2021-05-19 MED ORDER — CIPROFLOXACIN HCL 500 MG PO TABS: 500.0000 mg | ORAL_TABLET | Freq: Two times a day (BID) | ORAL | 0 refills | Status: AC

## 2021-05-19 NOTE — Progress Notes (Signed)
? ?  Subjective:  ? ? Patient ID: Roger Cal., male    DOB: 21-Apr-1960, 61 y.o.   MRN: [id number] ? ?HPI ?Here for recurrent symptoms of low back pain, frequency of urination, and burning after he finishes urinating. No fever or nausea. He saw [facility] about a month ago and they diagnosed a UTI. He took 14 days of Doxycycline, but this has not seemed to help any. He drinks plenty of water.  ? ? ?Review of Systems  ?Constitutional: Negative.   ?Respiratory: Negative.    ?Cardiovascular: Negative.   ?Gastrointestinal: Negative.   ?Genitourinary:  Positive for dysuria, flank pain, frequency and urgency. Negative for hematuria and testicular pain.  ?Musculoskeletal:  Positive for back pain.  ? ?   ?Objective:  ? Physical Exam ?Constitutional:   ?   Appearance: Normal appearance. He is not ill-appearing.  ?Cardiovascular:  ?   Rate and Rhythm: Normal rate and regular rhythm.  ?   Pulses: Normal pulses.  ?   Heart sounds: Normal heart sounds.  ?Pulmonary:  ?   Effort: Pulmonary effort is normal.  ?   Breath sounds: Normal breath sounds.  ?Abdominal:  ?   General: Abdomen is flat. Bowel sounds are normal. There is no distension.  ?   Palpations: Abdomen is soft. There is no mass.  ?   Tenderness: There is no abdominal tenderness. There is no right CVA tenderness, left CVA tenderness, guarding or rebound.  ?   Hernia: No hernia is present.  ?Neurological:  ?   Mental Status: He is alert.  ? ? ? ? ? ?   ?Assessment & Plan:  ?Partially treated UTI, possibly prostatitis. Treat with 14 days of Cipro 500 mg BID. Recheck as needed.  ?Alysia Penna, MD ? ? ?

## 2021-08-25 ENCOUNTER — Other Ambulatory Visit: Payer: Self-pay | Admitting: Family Medicine

## 2021-08-25 NOTE — Telephone Encounter (Signed)
Pt has not been seen by Dr. Georgina Snell in over a year and last visit was not related to needing a muscle relaxer.

## 2021-09-16 IMAGING — CT CT CHEST W/ CM
1 series · 15 of 34 positions shown, 19 images · IV contrast (APPLIED)
Comparison: None.

CLINICAL DATA: Lung nodule seen on prior imaging

EXAM:
CT CHEST WITH CONTRAST
TECHNIQUE: Multidetector CT imaging of the chest was performed during
intravenous contrast administration.
CONTRAST:  75mL [id number] IOPAMIDOL ([id number]-[id number]) INJECTION 61%

[Series 2: chest w/cm · axial · 0.91mm/px · z∈[-340,-48]mm · 15 of 172 slices shown, 19 images]
[im 13/172  mediastinal]
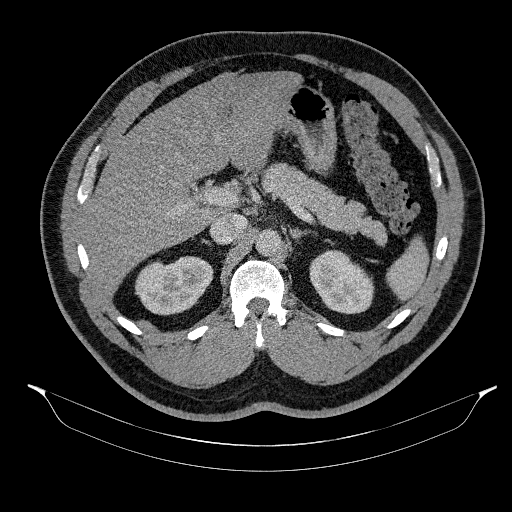
[im 13/172  lung]
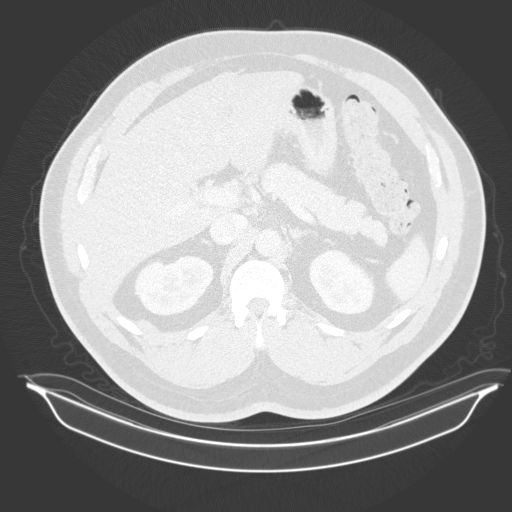
[im 26/172  lung]
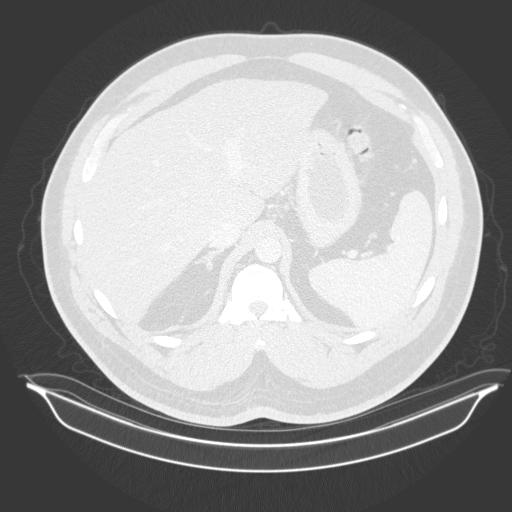
[im 35/172  lung]
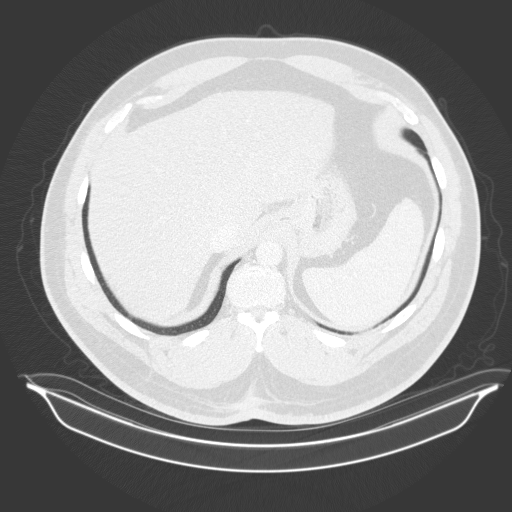
[im 45/172  lung]
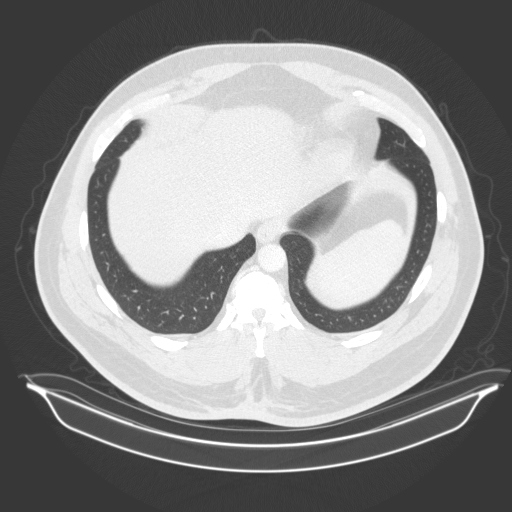
[im 58/172  mediastinal]
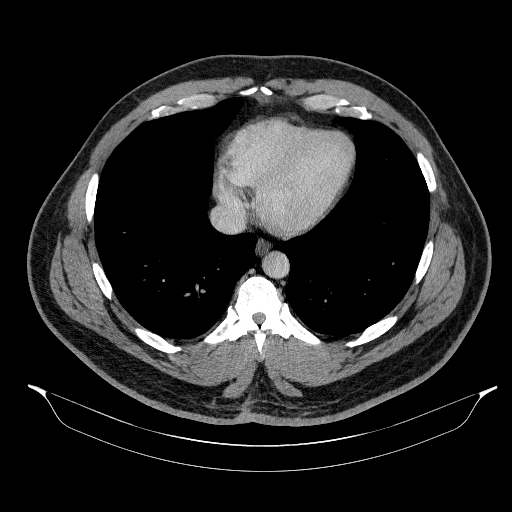
[im 58/172  lung]
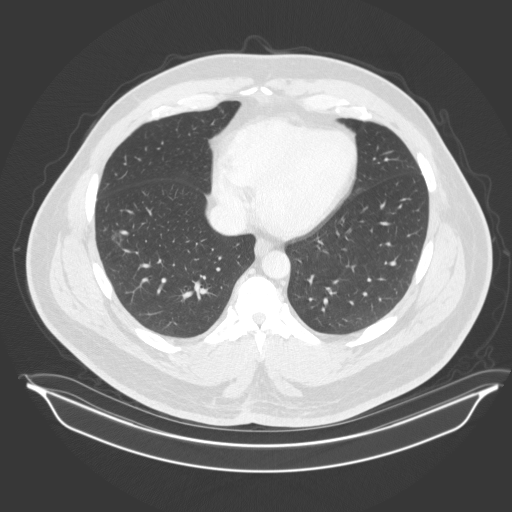
[im 69/172  lung]
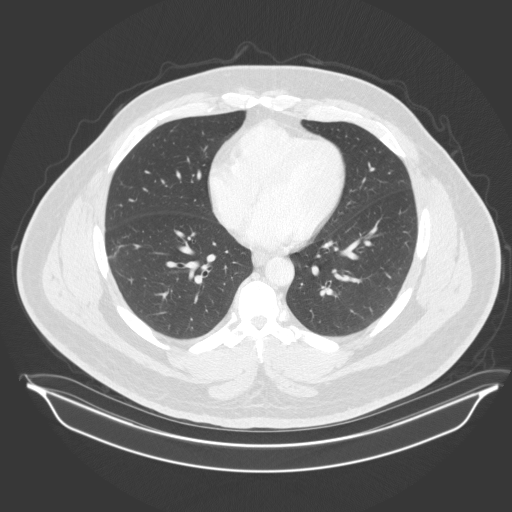
[im 77/172  lung]
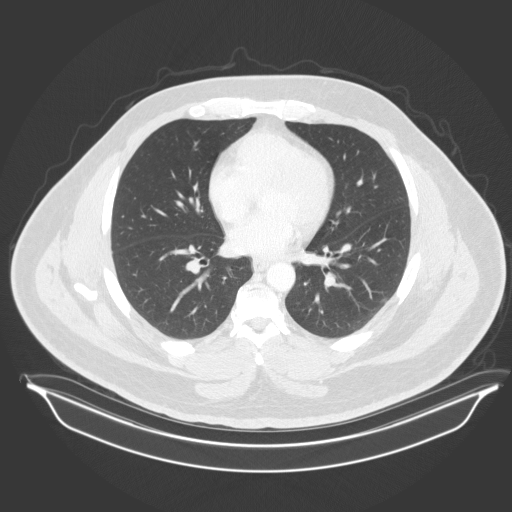
[im 89/172  lung]
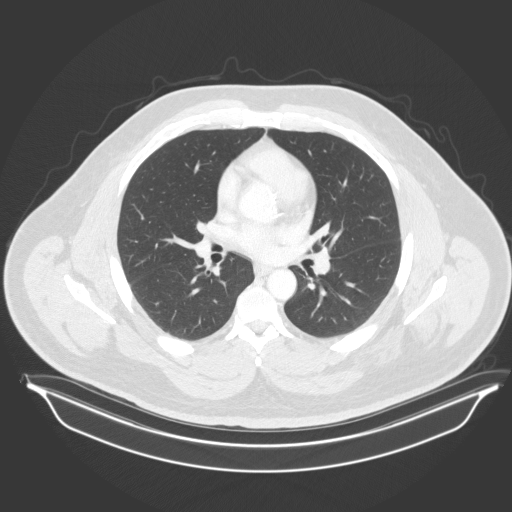
[im 96/172  mediastinal]
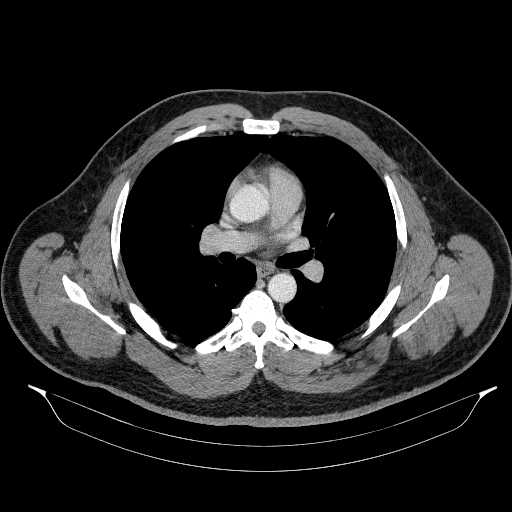
[im 96/172  lung]
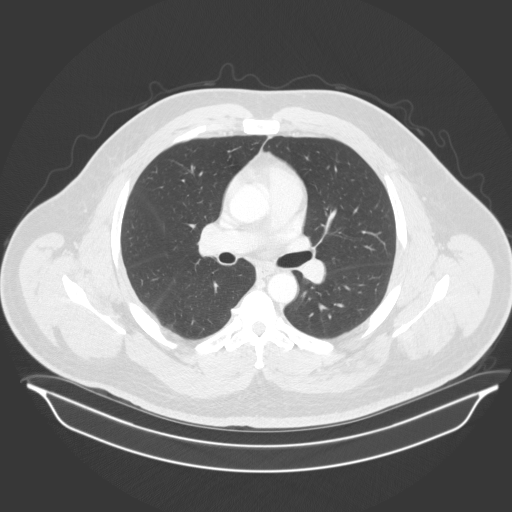
[im 103/172  lung]
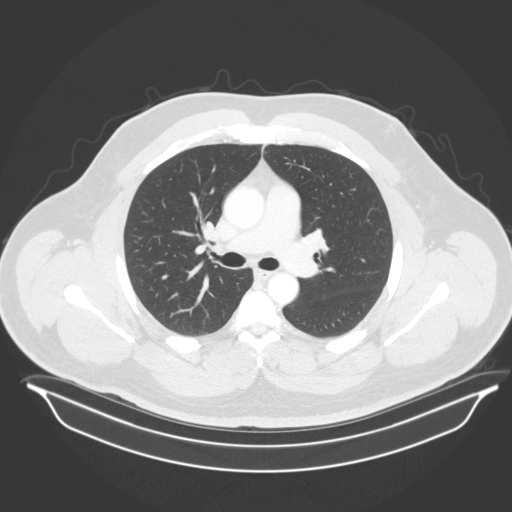
[im 115/172  lung]
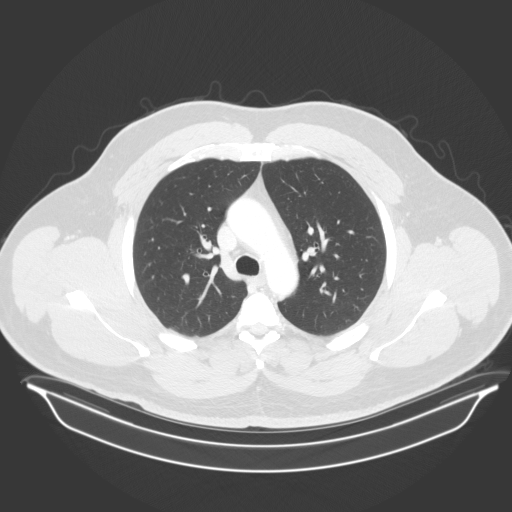
[im 127/172  lung]
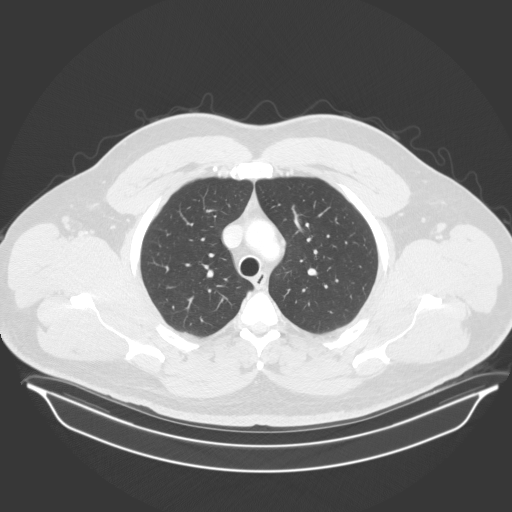
[im 137/172  mediastinal]
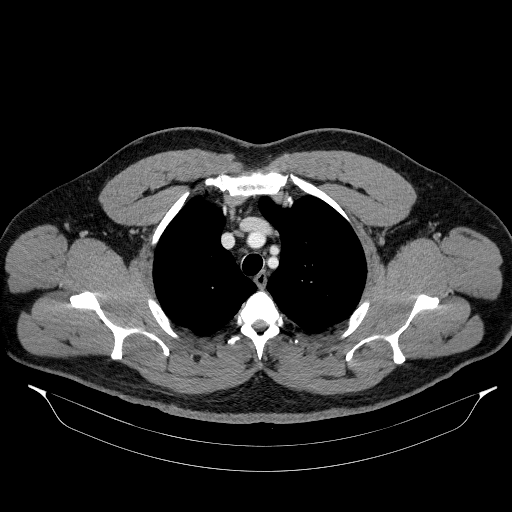
[im 137/172  lung]
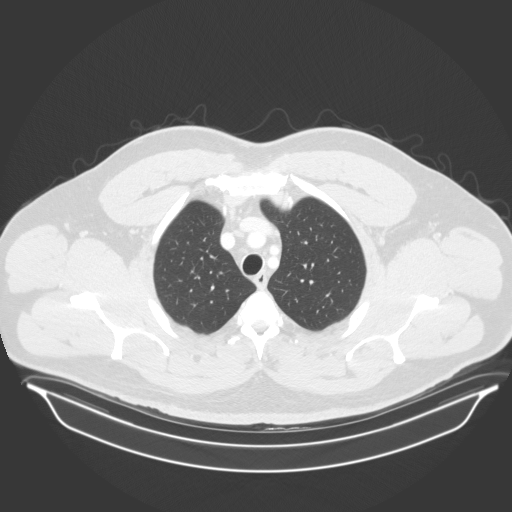
[im 146/172  lung]
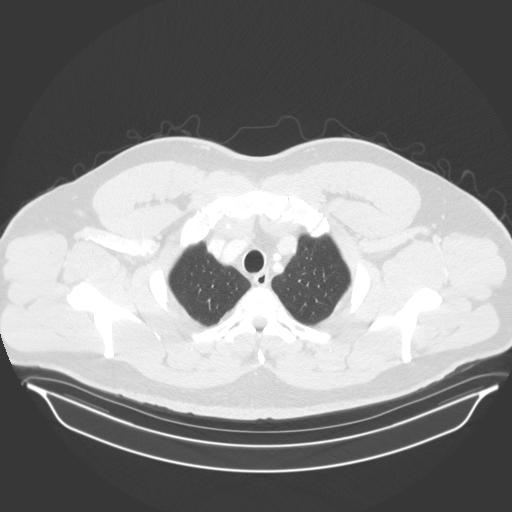
[im 159/172  lung]
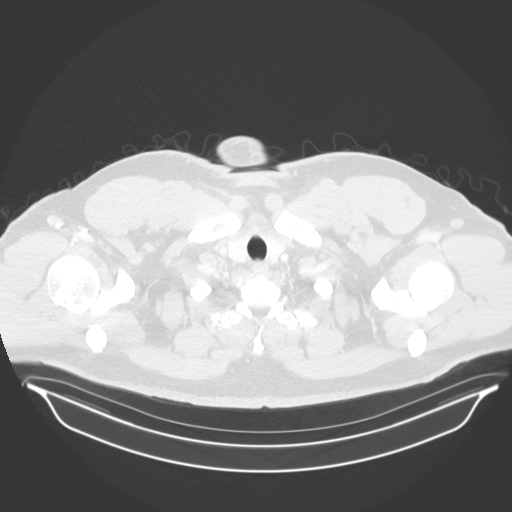

[15 of 34 positions shown; findings below may reference images not displayed]

FINDINGS: Cardiovascular: Scattered coronary artery and aortic
atherosclerosis. Heart is normal size. Aorta is normal caliber.

Mediastinum/Nodes: No mediastinal, hilar, or axillary adenopathy.
Thyroid unremarkable. Trachea and esophagus are unremarkable.

Lungs/Pleura: Within the right lower lobe, there is a mixed cystic
and solid mass which measures 2.4 x 2.0 cm. There is an eccentric
solid component measuring up to 1.7 cm. Findings are suspicious for
cavitary in lung cancer. Lungs otherwise clear. No effusions.

Upper Abdomen: Diffuse fatty infiltration of the liver. No acute
findings.

Musculoskeletal: Chest wall soft tissues are unremarkable. No acute
bony abnormality.
IMPRESSION: 2.4 x 2.0 cm mixed cystic and solid lesion in the right lower lobe
with eccentric irregular solid component measuring up to 1.7 cm.
This is concerning for lung cancer. Recommend PET CT and multi
disciplinary thoracic Oncology evaluation.

Fatty infiltration of the liver.

Scattered coronary artery and aortic calcifications.

These results will be called to the ordering clinician or
representative by the Radiologist Assistant, and communication
documented in the PACS or zVision Dashboard.

## 2021-09-17 ENCOUNTER — Ambulatory Visit (INDEPENDENT_AMBULATORY_CARE_PROVIDER_SITE_OTHER): Payer: 59 | Admitting: Family Medicine

## 2021-09-17 VITALS — BP 120/82 | HR 86 | Temp 98.3°F | Ht 71.25 in | Wt 307.0 lb

## 2021-09-17 DIAGNOSIS — E559 Vitamin D deficiency, unspecified: Secondary | ICD-10-CM | POA: Diagnosis not present

## 2021-09-17 DIAGNOSIS — N529 Male erectile dysfunction, unspecified: Secondary | ICD-10-CM

## 2021-09-17 DIAGNOSIS — Z23 Encounter for immunization: Secondary | ICD-10-CM

## 2021-09-17 DIAGNOSIS — Z125 Encounter for screening for malignant neoplasm of prostate: Secondary | ICD-10-CM | POA: Diagnosis not present

## 2021-09-17 DIAGNOSIS — R300 Dysuria: Secondary | ICD-10-CM | POA: Diagnosis not present

## 2021-09-17 DIAGNOSIS — Z0000 Encounter for general adult medical examination without abnormal findings: Secondary | ICD-10-CM

## 2021-09-17 DIAGNOSIS — E782 Mixed hyperlipidemia: Secondary | ICD-10-CM

## 2021-09-17 DIAGNOSIS — K649 Unspecified hemorrhoids: Secondary | ICD-10-CM

## 2021-09-17 LAB — LIPID PANEL
Cholesterol: 190 mg/dL (ref 0–200)
HDL: 36.7 mg/dL — ABNORMAL LOW (ref >39.00)
NonHDL: 153.45
Total CHOL/HDL Ratio: 5
Triglycerides: 388 mg/dL — ABNORMAL HIGH (ref 0.0–149.0)
VLDL: 77.6 mg/dL — ABNORMAL HIGH (ref 0.0–40.0)

## 2021-09-17 LAB — CBC WITH DIFFERENTIAL/PLATELET
Basophils Absolute: 0 10*3/uL (ref 0.0–0.1)
Basophils Relative: 0.3 % (ref 0.0–3.0)
Eosinophils Absolute: 0.2 10*3/uL (ref 0.0–0.7)
Eosinophils Relative: 2.9 % (ref 0.0–5.0)
HCT: 43.1 % (ref 39.0–52.0)
Hemoglobin: 14.4 g/dL (ref 13.0–17.0)
Lymphocytes Relative: 32 % (ref 12.0–46.0)
Lymphs Abs: 2.6 10*3/uL (ref 0.7–4.0)
MCHC: 33.4 g/dL (ref 30.0–36.0)
MCV: 90.8 fl (ref 78.0–100.0)
Monocytes Absolute: 0.5 10*3/uL (ref 0.1–1.0)
Monocytes Relative: 6.2 % (ref 3.0–12.0)
Neutro Abs: 4.7 10*3/uL (ref 1.4–7.7)
Neutrophils Relative %: 58.6 % (ref 43.0–77.0)
Platelets: 256 10*3/uL (ref 150.0–400.0)
RBC: 4.75 Mil/uL (ref 4.22–5.81)
RDW: 13.7 % (ref 11.5–15.5)
WBC: 8.1 10*3/uL (ref 4.0–10.5)

## 2021-09-17 LAB — COMPREHENSIVE METABOLIC PANEL
ALT: 32 U/L (ref 0–53)
AST: 24 U/L (ref 0–37)
Albumin: 5 g/dL (ref 3.5–5.2)
Alkaline Phosphatase: 85 U/L (ref 39–117)
BUN: 9 mg/dL (ref 6–23)
CO2: 24 mEq/L (ref 19–32)
Calcium: 9.4 mg/dL (ref 8.4–10.5)
Chloride: 101 mEq/L (ref 96–112)
Creatinine, Ser: 0.98 mg/dL (ref 0.40–1.50)
GFR: 83.04 mL/min (ref >60.00)
Glucose, Bld: 81 mg/dL (ref 70–99)
Potassium: 3.9 mEq/L (ref 3.5–5.1)
Sodium: 140 mEq/L (ref 135–145)
Total Bilirubin: 0.7 mg/dL (ref 0.2–1.2)
Total Protein: 7.9 g/dL (ref 6.0–8.3)

## 2021-09-17 LAB — POCT URINALYSIS DIPSTICK
Bilirubin, UA: NEGATIVE
Blood, UA: NEGATIVE
Glucose, UA: NEGATIVE
Ketones, UA: NEGATIVE
Leukocytes, UA: NEGATIVE
Nitrite, UA: NEGATIVE
Protein, UA: POSITIVE — AB
Spec Grav, UA: 1.015 (ref 1.010–1.025)
Urobilinogen, UA: 0.2 E.U./dL
pH, UA: 6 (ref 5.0–8.0)

## 2021-09-17 LAB — VITAMIN D 25 HYDROXY (VIT D DEFICIENCY, FRACTURES): VITD: 40.14 ng/mL (ref 30.00–100.00)

## 2021-09-17 LAB — HEMOGLOBIN A1C: Hgb A1c MFr Bld: 5.2 % (ref 4.6–6.5)

## 2021-09-17 LAB — LDL CHOLESTEROL, DIRECT: Direct LDL: 92 mg/dL

## 2021-09-17 LAB — T4, FREE: Free T4: 0.69 ng/dL (ref 0.60–1.60)

## 2021-09-17 LAB — PSA: PSA: 0.5 ng/mL (ref 0.10–4.00)

## 2021-09-17 LAB — TSH: TSH: 1.51 u[IU]/mL (ref 0.35–5.50)

## 2021-09-17 MED ORDER — SILDENAFIL CITRATE 20 MG PO TABS: ORAL_TABLET | ORAL | 2 refills | Status: AC

## 2021-09-17 MED ORDER — PRAMOXINE-HC 1-1 % EX CREA: TOPICAL_CREAM | Freq: Three times a day (TID) | CUTANEOUS | 1 refills | Status: DC

## 2021-09-17 NOTE — Progress Notes (Signed)
Subjective:     Roger Mendoza. is a 61 y.o. male and is here for a comprehensive physical exam.  Pt doing well.  Considering retiring in Oct vs working until 60 yo.   Pt with h/o lung ca s/p lobectomy.  Had f/u with oncology for surveillance which went well. Pt notes discomfort with urination.  Needs refills on sildenafil and analpram.  Social History   Socioeconomic History   Marital status: Married    Spouse name: Not on file   Number of children: Not on file   Years of education: Not on file   Highest education level: Not on file  Occupational History   Occupation: loads truck  Tobacco Use   Smoking status: Former    Packs/day: 0.25    Years: 15.00    Total pack years: 3.75    Types: Cigarettes    Quit date: 03/31/2014    Years since quitting: 7.4   Smokeless tobacco: Never  Vaping Use   Vaping Use: Never used  Substance and Sexual Activity   Alcohol use: Yes    Alcohol/week: 15.0 standard drinks of alcohol    Types: 3 Glasses of wine, 12 Cans of beer per week    Comment: couple glasses of wine per day   Drug use: No   Sexual activity: Not on file  Other Topics Concern   Not on file  Social History Narrative   Married, has 50yo son, works for [employer] at [employer], loading trucks, exercise - walks a lot at work, diet - has cut back on sweets, bread and greasy foods.  Plays golf, used to play softball.  Attends church, deacon.  04/2016   Social Determinants of Health   Financial Resource Strain: Not on file  Food Insecurity: Not on file  Transportation Needs: Not on file  Physical Activity: Not on file  Stress: Not on file  Social Connections: Not on file  Intimate Partner Violence: Not on file   Health Maintenance  Topic Date Due   COVID-19 Vaccine (4 - Pfizer risk series) 03/06/2020   TETANUS/TDAP  09/16/2020   INFLUENZA VACCINE  08/24/2021   COLONOSCOPY (Pts 45-60yrs Insurance coverage will need to be confirmed)  01/08/2024   Hepatitis C Screening   Completed   HIV Screening  Completed   Zoster Vaccines- Shingrix  Completed   HPV VACCINES  Aged Out    The following portions of the patient's history were reviewed and updated as appropriate: allergies, current medications, past family history, past medical history, past social history, past surgical history, and problem list.  Review of Systems Pertinent items noted in HPI and remainder of comprehensive ROS otherwise negative.   Objective:    BP 120/82 (BP Location: Left Arm, Patient Position: Sitting, Cuff Size: Large)   Pulse 86   Temp 98.3 F (36.8 C) (Oral)   Ht 5' 11.25" (1.81 m)   Wt (!) 307 lb (139.3 kg)   SpO2 95%   BMI 42.52 kg/m  General appearance: alert, cooperative, and no distress Head: Normocephalic, without obvious abnormality, atraumatic Eyes: conjunctivae/corneas clear. PERRL, EOM's intact. Fundi benign. Ears: normal TM's and external ear canals both ears Nose: Nares normal. Septum midline. Mucosa normal. No drainage or sinus tenderness. Throat: lips, mucosa, and tongue normal; teeth and gums normal Neck: no adenopathy, no carotid bruit, no JVD, supple, symmetrical, trachea midline, and thyroid not enlarged, symmetric, no tenderness/mass/nodules Lungs: clear to auscultation bilaterally Heart: regular rate and rhythm, S1, S2 normal, no  murmur, click, rub or gallop Abdomen: soft, non-tender; bowel sounds normal; no masses,  no organomegaly Extremities: extremities normal, atraumatic, no cyanosis or edema Pulses: 2+ and symmetric Skin: Skin color, texture, turgor normal. No rashes or lesions Lymph nodes: Cervical, supraclavicular, and axillary nodes normal. Neurologic: Alert and oriented X 3, normal strength and tone. Normal symmetric reflexes. Normal coordination and gait    Assessment:    Healthy male exam.      Plan:    Anticipatory guidance given including wearing seatbelts, smoke detectors in the home, increasing physical activity, increasing p.o.  intake of water and vegetables. -labs -immunizations reviewed -next CPE in 1 yr See After Visit Summary for Counseling Recommendations   Well adult exam - Plan: TSH, T4, Free, Hemoglobin A1c  Vaccine for tetanus toxoid - Plan: Tdap vaccine greater than or equal to 7yo IM  Dysuria - Plan: POCT urinalysis dipstick, CBC with Differential/Platelet  Screening for prostate cancer - Plan: PSA  Mixed hyperlipidemia - Plan: Lipid panel, CMP  Vitamin D deficiency -vitamin D 13.80 on 08/19/19 and 22.95 on 08/21/20  - Plan: Vitamin D, 25-hydroxy  ED -Plan: Sildenfil  Hemorrhoids, unspecified -Plan: analpram Fu prn   Grier Mitts, MD

## 2021-09-22 ENCOUNTER — Telehealth: Payer: Self-pay | Admitting: Family Medicine

## 2021-09-22 DIAGNOSIS — R400 Somnolence: Secondary | ICD-10-CM

## 2021-09-22 NOTE — Telephone Encounter (Signed)
Pt called asking for a call back from the [facility] to go over his labs, done after CPE on 09/17/21.  Please call  [phone]

## 2021-09-29 NOTE — Telephone Encounter (Signed)
Can place referral to sleep med or wherever he was previously seen for CPAP.

## 2021-10-03 ENCOUNTER — Encounter: Payer: Self-pay | Admitting: Family Medicine

## 2021-10-04 NOTE — Addendum Note (Signed)
Addended by: Anderson Malta on: 10/04/2021 02:15 PM   Modules accepted: Orders

## 2021-10-15 ENCOUNTER — Ambulatory Visit (INDEPENDENT_AMBULATORY_CARE_PROVIDER_SITE_OTHER): Payer: 59 | Admitting: Primary Care

## 2021-10-15 ENCOUNTER — Encounter: Payer: Self-pay | Admitting: Primary Care

## 2021-10-15 VITALS — BP 140/70 | HR 83 | Temp 99.0°F | Ht 73.0 in | Wt 317.2 lb

## 2021-10-15 DIAGNOSIS — G4733 Obstructive sleep apnea (adult) (pediatric): Secondary | ICD-10-CM

## 2021-10-15 NOTE — Patient Instructions (Addendum)
Recommendations Continue to wear CPAP every night for minimum 4 to 6 hours or longer Look into getting a wedge pillow to use with CPAP  Orders: New CPAP 5-20cm h20 (DME-[facility])  Follow-up: 3 months with Beth NP for compliance check  CPAP and BIPAP Information CPAP and BIPAP are methods that use air pressure to keep your airways open and to help you breathe well. CPAP and BIPAP use different amounts of pressure. Your health care provider will tell you whether CPAP or BIPAP would be more helpful for you. CPAP stands for "continuous positive airway pressure." With CPAP, the amount of pressure stays the same while you breathe in (inhale) and out (exhale). BIPAP stands for "bi-level positive airway pressure." With BIPAP, the amount of pressure will be higher when you inhale and lower when you exhale. This allows you to take larger breaths. CPAP or BIPAP may be used in the hospital, or your health care provider may want you to use it at home. You may need to have a sleep study before your health care provider can order a machine for you to use at home. What are the advantages? CPAP or BIPAP can be helpful if you have: Sleep apnea. Chronic obstructive pulmonary disease (COPD). Heart failure. Medical conditions that cause muscle weakness, including muscular dystrophy or amyotrophic lateral sclerosis (ALS). Other problems that cause breathing to be shallow, weak, abnormal, or difficult. CPAP and BIPAP are most commonly used for obstructive sleep apnea (OSA) to keep the airways from collapsing when the muscles relax during sleep. What are the risks? Generally, this is a safe treatment. However, problems may occur, including: Irritated skin or skin sores if the mask does not fit properly. Dry or stuffy nose or nosebleeds. Dry mouth. Feeling gassy or bloated. Sinus or lung infection if the equipment is not cleaned properly. When should CPAP or BIPAP be used? In most cases, the mask only needs to  be worn during sleep. Generally, the mask needs to be worn throughout the night and during any daytime naps. People with certain medical conditions may also need to wear the mask at other times, such as when they are awake. Follow instructions from your health care provider about when to use the machine. What happens during CPAP or BIPAP?  Both CPAP and BIPAP are provided by a small machine with a flexible plastic tube that attaches to a plastic mask that you wear. Air is blown through the mask into your nose or mouth. The amount of pressure that is used to blow the air can be adjusted on the machine. Your health care provider will set the pressure setting and help you find the best mask for you. Tips for using the mask Because the mask needs to be snug, some people feel trapped or closed-in (claustrophobic) when first using the mask. If you feel this way, you may need to get used to the mask. One way to do this is to hold the mask loosely over your nose or mouth and then gradually apply the mask more snugly. You can also gradually increase the amount of time that you use the mask. Masks are available in various types and sizes. If your mask does not fit well, talk with your health care provider about getting a different one. Some common types of masks include: Full face masks, which fit over the mouth and nose. Nasal masks, which fit over the nose. Nasal pillow or prong masks, which fit into the nostrils. If you are using a  mask that fits over your nose and you tend to breathe through your mouth, a chin strap may be applied to help keep your mouth closed. Use a skin barrier to protect your skin as told by your health care provider. Some CPAP and BIPAP machines have alarms that may sound if the mask comes off or develops a leak. If you have trouble with the mask, it is very important that you talk with your health care provider about finding a way to make the mask easier to tolerate. Do not stop using  the mask. There could be a negative impact on your health if you stop using the mask. Tips for using the machine Place your CPAP or BIPAP machine on a secure table or stand near an electrical outlet. Know where the on/off switch is on the machine. Follow instructions from your health care provider about how to set the pressure on your machine and when you should use it. Do not eat or drink while the CPAP or BIPAP machine is on. Food or fluids could get pushed into your lungs by the pressure of the CPAP or BIPAP. For home use, CPAP and BIPAP machines can be rented or purchased through home health care companies. Many different brands of machines are available. Renting a machine before purchasing may help you find out which particular machine works well for you. Your health insurance company may also decide which machine you may get. Keep the CPAP or BIPAP machine and attachments clean. Ask your health care provider for specific instructions. Check the humidifier if you have a dry stuffy nose or nosebleeds. Make sure it is working correctly. Follow these instructions at home: Take over-the-counter and prescription medicines only as told by your health care provider. Ask if you can take sinus medicine if your sinuses are blocked. Do not use any products that contain nicotine or tobacco. These products include cigarettes, chewing tobacco, and vaping devices, such as e-cigarettes. If you need help quitting, ask your health care provider. Keep all follow-up visits. This is important. Contact a health care provider if: You have redness or pressure sores on your head, face, mouth, or nose from the mask or head gear. You have trouble using the CPAP or BIPAP machine. You cannot tolerate wearing the CPAP or BIPAP mask. Someone tells you that you snore even when wearing your CPAP or BIPAP. Get help right away if: You have trouble breathing. You feel confused. Summary CPAP and BIPAP are methods that use air  pressure to keep your airways open and to help you breathe well. If you have trouble with the mask, it is very important that you talk with your health care provider about finding a way to make the mask easier to tolerate. Do not stop using the mask. There could be a negative impact to your health if you stop using the mask. Follow instructions from your health care provider about when to use the machine. This information is not intended to replace advice given to you by your health care provider. Make sure you discuss any questions you have with your health care provider. Document Revised: 08/19/2020 Document Reviewed: 12/20/2019 Elsevier Patient Education  [place].

## 2021-10-15 NOTE — Progress Notes (Unsigned)
@Patient  ID: Roger Mendoza., male    DOB: [birth year], 61 y.o.   MRN: [id number]  No chief complaint on file.   Referring provider: [name], MD  HPI: 61 year old male, former smoker.  Past medical history significant for OSA, lung nodule, vitamin D deficiency, morbid obesity.  10/15/2021 Patient presents today for sleep consult.   Sleeping well at night. He still snores. He sleeps well through the night. Bedtime is 10pm and he wakes up at 5:15am. No daytimes sleepiness.  Machine is > 10 years ago, whistling sounds. He uses full face mask.  He is compliant with CPAP every night Pressure auto 5-20 DME is [facility] ([facility])  [facility] download 09/14/21-10/13/21 30/30 days; 100% > 4 hours Average usage 7 hours 18 min Pressure 5-20cm h20 AHI 1.6    Sleep questionnaire Symptoms-  Hx sleep apnea, snoring Prior sleep study- 10pm Bedtime- 10 pm Time to fall asleep- Not long  Nocturnal awakenings- None Out of bed/start of day- 5:15am Weight changes- None Do you operate heavy machinery- No Do you currently wear CPAP- Yes Do you current wear oxygen- No [place]- 3  No Known Allergies  Immunization History  Administered Date(s) Administered   PFIZER(Purple Top)SARS-COV-2 Vaccination 04/25/2019, 05/20/2019, 01/10/2020   Tdap 09/17/2010, 09/17/2021   Zoster Recombinat (Shingrix) 08/21/2020, 10/22/2020    Past Medical History:  Diagnosis Date   Allergic rhinitis, seasonal    Allergy    seasonal   Cancer ([place])    lung -  35yrs ago- is not under any kind of treatment   Colon polyp 02/2012   tubular adenoma, Dr. Sharee Holster Quervain's tenosynovitis, right    hx/o   Epididymitis    hx/o as adult   Erectile dysfunction    Family history of premature CAD    father   Heart murmur    Hemorrhoids    hx of   Hyperlipidemia    Left inguinal hernia    tiny as of 03/2014   MRSA (methicillin resistant staph aureus) culture positive    history of MRSA skin on  pt's back/ unsure of date   Normal cardiac stress test    remote past per patient, [place], [place]   Obesity    OSA on CPAP    Recurrent UTI    Dr. Janice Norrie, [facility]   Sleep apnea    uses c-pap   Tinea pedis    Tobacco use    4 cigarrettes daily   Wears glasses     Tobacco History: Social History   Tobacco Use  Smoking Status Former   Packs/day: 0.25   Years: 15.00   Total pack years: 3.75   Types: Cigarettes   Quit date: 03/31/2014   Years since quitting: 7.5  Smokeless Tobacco Never   Counseling given: Not Answered   Outpatient Medications Prior to Visit  Medication Sig Dispense Refill   acetaminophen (TYLENOL) 325 MG tablet Take 650 mg by mouth every 6 (six) hours as needed.     ASPIRIN LOW DOSE 81 MG EC tablet TAKE 1 TABLET BY MOUTH EVERY DAY 90 tablet 3   Glycerin-Hypromellose-PEG 400 (VISINE DRY EYE) 0.2-0.2-1 % SOLN Place 1-2 drops into both eyes 3 (three) times daily as needed (dry/irritated eyes).     hydrocortisone (PROCTOSOL HC) 2.5 % rectal cream Place 1 application rectally 2 (two) times daily. (Patient taking differently: Place 1 application  rectally 2 (two) times daily as needed for hemorrhoids.) 28.35 g 0  meloxicam (MOBIC) 7.5 MG tablet TAKE 1 TABLET BY MOUTH EVERY DAY AS NEEDED FOR PAIN 30 tablet 0   NON FORMULARY C PAP     pramoxine-hydrocortisone (ANALPRAM HC) cream Apply topically 3 (three) times daily. 30 g 1   sildenafil (REVATIO) 20 MG tablet TAKE 1 TO 5 TABLETS BY MOUTH DAILY AS NEEDED PRIOR TO SEXUAL ACTIVITY 50 tablet 2   tiZANidine (ZANAFLEX) 2 MG tablet TAKE 1 TO 2 TABLETS BY MOUTH AT BEDTIME AS NEEDED FOR MUSCLE SPASMS 180 tablet 1   traMADol (ULTRAM) 50 MG tablet Take 50 mg by mouth every 6 (six) hours as needed (pain).     Vitamin D, Ergocalciferol, (DRISDOL) 1.25 MG (50000 UNIT) CAPS capsule Take 1 capsule (50,000 Units total) by mouth every 7 (seven) days. 12 capsule 0   No facility-administered medications prior to visit.       Review of Systems  Review of Systems   Physical Exam  There were no vitals taken for this visit. Physical Exam   Lab Results:  CBC    Component Value Date/Time   WBC 8.1 09/17/2021 1116   RBC 4.75 09/17/2021 1116   HGB 14.4 09/17/2021 1116   HGB 14.4 03/08/2021 1356   HCT 43.1 09/17/2021 1116   PLT 256.0 09/17/2021 1116   PLT 299 03/08/2021 1356   MCV 90.8 09/17/2021 1116   MCH 29.9 03/08/2021 1356   MCHC 33.4 09/17/2021 1116   RDW 13.7 09/17/2021 1116   LYMPHSABS 2.6 09/17/2021 1116   MONOABS 0.5 09/17/2021 1116   EOSABS 0.2 09/17/2021 1116   BASOSABS 0.0 09/17/2021 1116    BMET    Component Value Date/Time   NA 140 09/17/2021 1116   K 3.9 09/17/2021 1116   CL 101 09/17/2021 1116   CO2 24 09/17/2021 1116   GLUCOSE 81 09/17/2021 1116   BUN 9 09/17/2021 1116   CREATININE 0.98 09/17/2021 1116   CREATININE 1.03 03/08/2021 1356   CREATININE 1.15 05/11/2015 0001   CALCIUM 9.4 09/17/2021 1116   GFRNONAA >60 03/08/2021 1356   GFRAA >60 03/02/2019 0342    BNP No results found for: "BNP"  ProBNP No results found for: "PROBNP"  Imaging: No results found.   Assessment & Plan:   No problem-specific Assessment & Plan notes found for this encounter.     Martyn Ehrich, NP 10/15/2021

## 2021-10-18 NOTE — Assessment & Plan Note (Signed)
-   Hx sleep apnea, dx 2012. He remains 100% compliance with download. No issues with mask fit or pressure setting. Pressure 5-20cm h20; Residual AHI 1.6/hiour. bNo changes needed today. Referral for new CPAP machine send to [facility] [place]. Encourage patient continue to wear CPAP every night for min 4-6 hours or longer. Work on weight loss efforts. FU in 3 months or sooner.

## 2021-10-18 NOTE — Progress Notes (Signed)
Reviewed and agree with assessment/plan.   Chesley Mires, MD [facility] Pulmonary/Critical Care 10/18/2021, 11:44 AM Pager:  [phone]

## 2021-10-27 ENCOUNTER — Telehealth: Payer: Self-pay | Admitting: Primary Care

## 2021-10-29 NOTE — Telephone Encounter (Signed)
Spoke with Dawn at [facility] and confirmed C-Pap settings as 5 - 20 cm h2O. Dawn stated confirmation. Nothing further needed at this time.

## 2022-01-14 ENCOUNTER — Ambulatory Visit: Payer: 59 | Admitting: Family Medicine

## 2022-01-14 ENCOUNTER — Encounter: Payer: Self-pay | Admitting: Family Medicine

## 2022-01-14 VITALS — BP 136/76 | HR 96 | Temp 99.0°F | Ht 73.0 in | Wt 310.0 lb

## 2022-01-14 DIAGNOSIS — R202 Paresthesia of skin: Secondary | ICD-10-CM

## 2022-01-14 DIAGNOSIS — R1011 Right upper quadrant pain: Secondary | ICD-10-CM

## 2022-01-14 DIAGNOSIS — E782 Mixed hyperlipidemia: Secondary | ICD-10-CM

## 2022-01-14 DIAGNOSIS — R109 Unspecified abdominal pain: Secondary | ICD-10-CM | POA: Diagnosis not present

## 2022-01-14 DIAGNOSIS — R8290 Unspecified abnormal findings in urine: Secondary | ICD-10-CM | POA: Diagnosis not present

## 2022-01-14 LAB — POCT URINALYSIS DIPSTICK
Bilirubin, UA: NEGATIVE
Blood, UA: NEGATIVE
Glucose, UA: NEGATIVE
Ketones, UA: NEGATIVE
Leukocytes, UA: NEGATIVE
Nitrite, UA: NEGATIVE
Protein, UA: POSITIVE — AB
Spec Grav, UA: 1.015 (ref 1.010–1.025)
Urobilinogen, UA: 0.2 E.U./dL
pH, UA: 6 (ref 5.0–8.0)

## 2022-01-14 LAB — LIPID PANEL
Cholesterol: 209 mg/dL — ABNORMAL HIGH (ref 0–200)
HDL: 41.9 mg/dL (ref >39.00)
LDL Cholesterol: 138 mg/dL — ABNORMAL HIGH (ref 0–99)
NonHDL: 167.24
Total CHOL/HDL Ratio: 5
Triglycerides: 145 mg/dL (ref 0.0–149.0)
VLDL: 29 mg/dL (ref 0.0–40.0)

## 2022-01-14 LAB — CBC WITH DIFFERENTIAL/PLATELET
Basophils Absolute: 0 10*3/uL (ref 0.0–0.1)
Basophils Relative: 0.3 % (ref 0.0–3.0)
Eosinophils Absolute: 0.3 10*3/uL (ref 0.0–0.7)
Eosinophils Relative: 3 % (ref 0.0–5.0)
HCT: 44.5 % (ref 39.0–52.0)
Hemoglobin: 15.1 g/dL (ref 13.0–17.0)
Lymphocytes Relative: 28.8 % (ref 12.0–46.0)
Lymphs Abs: 2.7 10*3/uL (ref 0.7–4.0)
MCHC: 33.9 g/dL (ref 30.0–36.0)
MCV: 91 fl (ref 78.0–100.0)
Monocytes Absolute: 0.7 10*3/uL (ref 0.1–1.0)
Monocytes Relative: 7.1 % (ref 3.0–12.0)
Neutro Abs: 5.6 10*3/uL (ref 1.4–7.7)
Neutrophils Relative %: 60.8 % (ref 43.0–77.0)
Platelets: 261 10*3/uL (ref 150.0–400.0)
RBC: 4.89 Mil/uL (ref 4.22–5.81)
RDW: 13.6 % (ref 11.5–15.5)
WBC: 9.3 10*3/uL (ref 4.0–10.5)

## 2022-01-14 LAB — URINALYSIS, ROUTINE W REFLEX MICROSCOPIC
Bilirubin Urine: NEGATIVE
Hgb urine dipstick: NEGATIVE
Ketones, ur: NEGATIVE
Leukocytes,Ua: NEGATIVE
Nitrite: NEGATIVE
Specific Gravity, Urine: 1.015 (ref 1.000–1.030)
Total Protein, Urine: NEGATIVE
Urine Glucose: NEGATIVE
Urobilinogen, UA: 0.2 (ref 0.0–1.0)
pH: 5.5 (ref 5.0–8.0)

## 2022-01-14 LAB — COMPREHENSIVE METABOLIC PANEL
ALT: 40 U/L (ref 0–53)
AST: 32 U/L (ref 0–37)
Albumin: 5.1 g/dL (ref 3.5–5.2)
Alkaline Phosphatase: 78 U/L (ref 39–117)
BUN: 13 mg/dL (ref 6–23)
CO2: 28 mEq/L (ref 19–32)
Calcium: 9.8 mg/dL (ref 8.4–10.5)
Chloride: 103 mEq/L (ref 96–112)
Creatinine, Ser: 1.05 mg/dL (ref 0.40–1.50)
GFR: 76.27 mL/min (ref >60.00)
Glucose, Bld: 105 mg/dL — ABNORMAL HIGH (ref 70–99)
Potassium: 3.9 mEq/L (ref 3.5–5.1)
Sodium: 140 mEq/L (ref 135–145)
Total Bilirubin: 0.7 mg/dL (ref 0.2–1.2)
Total Protein: 8.3 g/dL (ref 6.0–8.3)

## 2022-01-14 LAB — HEMOGLOBIN A1C: Hgb A1c MFr Bld: 5.2 % (ref 4.6–6.5)

## 2022-01-14 NOTE — Progress Notes (Signed)
Subjective:    Patient ID: Roger Cal., male    DOB: Aug 13, 1960, 61 y.o.   MRN: [id number]  Chief Complaint  Patient presents with   Follow-up    HPI Patient was seen today for f/u on labs and ongoing concern.  Patient endorses right flank pain and back pain ongoing since RLL wedge and right lower lobectomy 2 yrs ago.  Patient also endorses cloudy urine.  Denies dysuria, hematuria, constipation, sharp pain that moves, nausea, vomiting.  Patient endorses tingling sensation in left foot.  Requesting glucose check.  Patient started taking fish oil tablets in the last 2 weeks and cutting down on fried foods as triglycerides were elevated at 388 in August 2023.  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  Past Medical History:  Diagnosis Date   Allergic rhinitis, seasonal    Allergy    seasonal   Cancer ([place])    lung -  49yrs ago- is not under any kind of treatment   Colon polyp 02/2012   tubular adenoma, Dr. Sharee Holster Quervain's tenosynovitis, right    hx/o   Epididymitis    hx/o as adult   Erectile dysfunction    Family history of premature CAD    father   Heart murmur    Hemorrhoids    hx of   Hyperlipidemia    Left inguinal hernia    tiny as of 03/2014   MRSA (methicillin resistant staph aureus) culture positive    history of MRSA skin on pt's back/ unsure of date   Normal cardiac stress test    remote past per patient, [place], [place]   Obesity    OSA on CPAP    Recurrent UTI    Dr. Janice Norrie, [facility]   Sleep apnea    uses c-pap   Tinea pedis    Tobacco use    4 cigarrettes daily   Wears glasses     No Known Allergies  ROS General: Denies fever, chills, night sweats, changes in weight, changes in appetite HEENT: Denies headaches, ear pain, changes in vision, rhinorrhea, sore throat CV: Denies CP, palpitations, SOB, orthopnea Pulm: Denies SOB, cough, wheezing GI: Denies abdominal pain, nausea, vomiting, diarrhea, constipation +  RUQ pain GU: Denies dysuria, hematuria, frequency + cloudy urine Msk: Denies muscle cramps, joint pains  + right-sided back pain/joint pain Neuro: Denies weakness, numbness, tingling + tingling in left foot Skin: Denies rashes, bruising Psych: Denies depression, anxiety, hallucinations     Objective:    Blood pressure 136/76, pulse 96, temperature 99 F (37.2 C), temperature source Oral, height 6\' 1"  (1.854 m), weight (!) 310 lb (140.6 kg), SpO2 97 %.  Gen. Pleasant, well-nourished, in no distress, normal affect   HEENT: [place]/AT, face symmetric, conjunctiva clear, no scleral icterus, PERRLA, EOMI, nares patent without drainage Lungs: no accessory muscle use, CTAB, no wheezes or rales Cardiovascular: RRR, no m/r/g, no peripheral edema Abdomen: BS present, soft, NT/ND, no hepatosplenomegaly. Musculoskeletal: Left fifth digit partially flexed, no cyanosis or clubbing, normal tone Neuro:  A&Ox3, CN II-XII intact, normal gait Skin:  Warm, no lesions/ rash   Wt Readings from Last 3 Encounters:  01/14/22 (!) 310 lb (140.6 kg)  10/15/21 (!) 317 lb 3.2 oz (143.9 kg)  09/17/21 (!) 307 lb (139.3 kg)    Lab Results  Component Value Date   WBC 8.1 09/17/2021   HGB 14.4 09/17/2021   HCT 43.1 09/17/2021   PLT 256.0 09/17/2021  GLUCOSE 81 09/17/2021   CHOL 190 09/17/2021   TRIG 388.0 (H) 09/17/2021   HDL 36.70 (L) 09/17/2021   LDLDIRECT 92.0 09/17/2021   LDLCALC 173 (H) 08/21/2020   ALT 32 09/17/2021   AST 24 09/17/2021   NA 140 09/17/2021   K 3.9 09/17/2021   CL 101 09/17/2021   CREATININE 0.98 09/17/2021   BUN 9 09/17/2021   CO2 24 09/17/2021   TSH 1.51 09/17/2021   PSA 0.50 09/17/2021   INR 1.0 02/26/2019   HGBA1C 5.2 09/17/2021    Assessment/Plan:  Cloudy urine - Plan: POCT urinalysis dipstick, Hemoglobin A1c, Urinalysis with Reflex Microscopic, Culture, Urine  Right flank pain - Plan: CBC with Differential/Platelet, CMP, Urinalysis with Reflex Microscopic, Culture,  Urine  Right upper quadrant abdominal pain - Plan: CBC with Differential/Platelet, CMP  Tingling sensation - Plan: Hemoglobin A1c, Vitamin B12, CBC with Differential/Platelet  Mixed hyperlipidemia - Plan: CMP, Lipid panel  Concern for UTI versus renal calculi given cloudy urine.  POC UA with protein and amber-colored urine.  Obtain micro and UCX.  Labs to evaluate for gallbladder/liver dysfunction.  May need imaging and GI referral based on results.  Also advised adhesions from prior surgery may be causing discomfort.  F/u as needed in the next 2-4 weeks  Grier Mitts, MD

## 2022-01-15 LAB — VITAMIN B12: Vitamin B-12: 660 pg/mL (ref 211–911)

## 2022-01-16 LAB — URINE CULTURE
MICRO NUMBER:: 14350838
SPECIMEN QUALITY:: ADEQUATE

## 2022-01-17 NOTE — Progress Notes (Signed)
01/18/22- 61 yoM former smoker followed for OSA, complicated by Lung Nodule, hx Lung AdenoCa wedge resection 2021, Allergic Rhinitis, Dyslipidemia, Morbid Obesity, Recurrent UTI,  NPSG 03/02/10- AHI 11.7/ hr CPAP auto 5-20/ [facility]  LOV-initial consult Walsh, NP 10/15/21-  ordered replacement for old CPAP  Download compliance- not available  His phone app indicates AHI 1.1/ hr Body weight today-314 lbs Covid vax- Flu vax- He reports using CPAP every night and sleeping well. No SD card. No changes or concerns. CT chest 04/16/21-  Followed by Dr Cliffton Asters IMPRESSION: 1. Posttreatment changes within the RIGHT lung, without evidence of recurrent or metastatic disease within chest. 2. Multivessel calcified coronary and aortic atherosclerosis. Aortic Atherosclerosis (ICD10-I70.0). 3. Main pulmonary artery dilation, likely reflecting underlying pulmonary arterial hypertension.  ROS-see HPI   Negative unless "+" Constitutional:    weight loss, night sweats, fevers, chills, fatigue, lassitude. HEENT:    headaches, difficulty swallowing, tooth/dental problems, sore throat,       sneezing, itching, ear ache, nasal congestion, post nasal drip, snoring CV:    chest pain, orthopnea, PND, swelling in lower extremities, anasarca,                                   dizziness, palpitations Resp:   shortness of breath with exertion or at rest.                productive cough,   non-productive cough, coughing up of blood.              change in color of mucus.  wheezing.   Skin:    rash or lesions. GI:  No-   heartburn, indigestion, abdominal pain, nausea, vomiting, diarrhea,                 change in bowel habits, loss of appetite GU: dysuria, change in color of urine, no urgency or frequency.   flank pain. MS:   joint pain, stiffness, decreased range of motion, back pain. Neuro-     nothing unusual Psych:  change in mood or affect.  depression or anxiety.   memory loss.   OBJ- Physical Exam General-  Alert, Oriented, Affect-appropriate, Distress- none acute, +Obese Skin- rash-none, lesions- none, excoriation- none Lymphadenopathy- none Head- atraumatic            Eyes- Gross vision intact, PERRLA, conjunctivae and secretions clear            Ears- Hearing, canals-normal            Nose- Clear, no-Septal dev, mucus, polyps, erosion, perforation             Throat- Mallampati II , mucosa clear , drainage- none, tonsils- atrophic Neck- flexible , trachea midline, no stridor , thyroid nl, carotid no bruit Chest - symmetrical excursion , unlabored           Heart/CV- RRR , no murmur , no gallop  , no rub, nl s1 s2                           - JVD- none , edema- none, stasis changes- none, varices- none           Lung- clear to P&A, wheeze- none, cough- none , dullness-none, rub- none           Chest wall-  Abd-  Br/ Gen/ Rectal- Not done, not indicated  Extrem- cyanosis- none, clubbing, none, atrophy- none, strength- nl Neuro- grossly intact to observation

## 2022-01-18 ENCOUNTER — Ambulatory Visit: Payer: 59 | Admitting: Internal Medicine

## 2022-01-18 ENCOUNTER — Other Ambulatory Visit: Payer: Self-pay | Admitting: Family Medicine

## 2022-01-18 ENCOUNTER — Encounter: Payer: Self-pay | Admitting: Internal Medicine

## 2022-01-18 ENCOUNTER — Ambulatory Visit: Payer: 59 | Admitting: Primary Care

## 2022-01-18 ENCOUNTER — Telehealth: Payer: Self-pay | Admitting: Family Medicine

## 2022-01-18 VITALS — BP 110/64 | HR 84 | Ht 73.0 in | Wt 314.4 lb

## 2022-01-18 DIAGNOSIS — R911 Solitary pulmonary nodule: Secondary | ICD-10-CM

## 2022-01-18 DIAGNOSIS — N3000 Acute cystitis without hematuria: Secondary | ICD-10-CM

## 2022-01-18 DIAGNOSIS — G4733 Obstructive sleep apnea (adult) (pediatric): Secondary | ICD-10-CM

## 2022-01-18 MED ORDER — AMOXICILLIN 500 MG PO TABS: 500.0000 mg | ORAL_TABLET | Freq: Two times a day (BID) | ORAL | 0 refills | Status: AC

## 2022-01-18 NOTE — Telephone Encounter (Signed)
Patient calling to check on lab results °

## 2022-01-18 NOTE — Patient Instructions (Signed)
You are doing great with your CPAP- we can continue Auto 5-20  Order- DME [facility]- please install AirView/ card for downloads

## 2022-01-20 NOTE — Telephone Encounter (Signed)
Contacted patient. Left a voicemail to call us back.

## 2022-01-20 NOTE — Telephone Encounter (Signed)
Patient is inform of the lab result. Verbalized understanding.

## 2022-02-04 ENCOUNTER — Encounter: Payer: Self-pay | Admitting: Family Medicine

## 2022-02-04 ENCOUNTER — Ambulatory Visit: Payer: 59 | Admitting: Family Medicine

## 2022-02-04 VITALS — BP 136/78 | HR 83 | Temp 98.4°F | Wt 319.2 lb

## 2022-02-04 DIAGNOSIS — M545 Low back pain, unspecified: Secondary | ICD-10-CM

## 2022-02-04 DIAGNOSIS — R8290 Unspecified abnormal findings in urine: Secondary | ICD-10-CM | POA: Diagnosis not present

## 2022-02-04 DIAGNOSIS — G8929 Other chronic pain: Secondary | ICD-10-CM

## 2022-02-04 LAB — POCT URINALYSIS DIPSTICK
Bilirubin, UA: NEGATIVE
Blood, UA: NEGATIVE
Glucose, UA: NEGATIVE
Ketones, UA: NEGATIVE
Leukocytes, UA: NEGATIVE
Nitrite, UA: NEGATIVE
Protein, UA: POSITIVE — AB
Spec Grav, UA: 1.02 (ref 1.010–1.025)
Urobilinogen, UA: 0.2 E.U./dL
pH, UA: 6 (ref 5.0–8.0)

## 2022-02-04 MED ORDER — TIZANIDINE HCL 2 MG PO TABS: ORAL_TABLET | ORAL | 1 refills | Status: DC

## 2022-02-04 NOTE — Patient Instructions (Addendum)
Your urine looks good, no signs of infection.  You can use topical analgesics such as Aspercreme, Biofreeze, IcyHot for your back.  You can also take Tylenol arthritis strength or use ibuprofen or Mobic to help with discomfort.  Heat is also good.  Make sure you are wearing supportive shoes at work or putting an insert into your current work shoes to help take some of the strain on her back muscles.  Massage is also good for back symptoms.  Continue stretching and try some the exercises attached.  For continued symptoms consider follow-up with sports med.  Refill on muscle relaxer sent to pharmacy.

## 2022-02-04 NOTE — Progress Notes (Signed)
Subjective:    Patient ID: Roger Brim., male    DOB: 1960/12/12, 62 y.o.   MRN: [id number]  Chief Complaint  Patient presents with   Follow-up    HPI Patient was seen today for follow-up.  Patient completed course of amoxicillin for UTI at the end of Dec.  Pt notes some improvement in back pain while on abx.  Urine still cloudy.  Denies dysuria, hematuria, back pain that moves, fever, chills.  Back feels tight, "like a knot".  Pain in lower midline back or b/l lumbar paraspinal muscles.  Dry needlinPt notes he was "speared in the back" in HS when hit in L low back by side of a football helmet.  Pt denies weakness in b/l LEs, loss of bowel or bladder.  Past Medical History:  Diagnosis Date   Allergic rhinitis, seasonal    Allergy    seasonal   Cancer (HCC)    lung -  62yrs ago- is not under any kind of treatment   Colon polyp 02/2012   tubular adenoma, Dr. Lottie Mussel Quervain's tenosynovitis, right    hx/o   Epididymitis    hx/o as adult   Erectile dysfunction    Family history of premature CAD    father   Heart murmur    Hemorrhoids    hx of   Hyperlipidemia    Left inguinal hernia    tiny as of 03/2014   MRSA (methicillin resistant staph aureus) culture positive    history of MRSA skin on pt's back/ unsure of date   Normal cardiac stress test    remote past per patient, [place], [place]   Obesity    OSA on CPAP    Recurrent UTI    Dr. Brunilda Payor, [facility]   Sleep apnea    uses c-pap   Tinea pedis    Tobacco use    4 cigarrettes daily   Wears glasses     No Known Allergies  ROS General: Denies fever, chills, night sweats, changes in weight, changes in appetite HEENT: Denies headaches, ear pain, changes in vision, rhinorrhea, sore throat CV: Denies CP, palpitations, SOB, orthopnea Pulm: Denies SOB, cough, wheezing GI: Denies abdominal pain, nausea, vomiting, diarrhea, constipation GU: Denies dysuria, hematuria, frequency + cloudy urine Msk:  Denies muscle cramps, joint pains  +low back pain Neuro: Denies weakness, numbness, tingling Skin: Denies rashes, bruising Psych: Denies depression, anxiety, hallucinations     Objective:    Blood pressure 136/78, pulse 83, temperature 98.4 F (36.9 C), temperature source Oral, weight (!) 319 lb 3.2 oz (144.8 kg), SpO2 97 %.  Gen. Pleasant, well-nourished, in no distress, normal affect   HEENT: [place]/AT, face symmetric, conjunctiva clear, no scleral icterus, PERRLA, EOMI, nares patent without drainage Lungs: no accessory muscle use, CTAB, no wheezes or rales Cardiovascular: RRR, no m/r/g, no peripheral edema Musculoskeletal: No TTP of cervical, thoracic, or lumbar spine, paraspinal muscles bilaterally.  Increased tension and right-sided lumbar paraspinal muscles.  No LE weakness bilaterally, no deformities, no cyanosis or clubbing, normal tone Neuro:  A&Ox3, CN II-XII intact, normal gait   Wt Readings from Last 3 Encounters:  02/04/22 (!) 319 lb 3.2 oz (144.8 kg)  01/18/22 (!) 314 lb 6.4 oz (142.6 kg)  01/14/22 (!) 310 lb (140.6 kg)    Lab Results  Component Value Date   WBC 9.3 01/14/2022   HGB 15.1 01/14/2022   HCT 44.5 01/14/2022   PLT 261.0 01/14/2022   GLUCOSE  105 (H) 01/14/2022   CHOL 209 (H) 01/14/2022   TRIG 145.0 01/14/2022   HDL 41.90 01/14/2022   LDLDIRECT 92.0 09/17/2021   LDLCALC 138 (H) 01/14/2022   ALT 40 01/14/2022   AST 32 01/14/2022   NA 140 01/14/2022   K 3.9 01/14/2022   CL 103 01/14/2022   CREATININE 1.05 01/14/2022   BUN 13 01/14/2022   CO2 28 01/14/2022   TSH 1.51 09/17/2021   PSA 0.50 09/17/2021   INR 1.0 02/26/2019   HGBA1C 5.2 01/14/2022    Assessment/Plan:  Chronic bilateral low back pain without sciatica - Plan: tiZANidine (ZANAFLEX) 2 MG tablet  Cloudy urine - Plan: POC Urinalysis Dipstick  UA negative for acute UTI.  Continue to increase p.o. intake of water and fluids.  Follow-up with urology for continued symptoms.  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  Discussed supportive care.  Muscle relaxer as needed.  Consider follow-up with sports med or alternative therapies such as acupuncture, water therapy, chiropractor.  F/u as needed  Abbe Amsterdam, MD

## 2022-02-15 ENCOUNTER — Encounter: Payer: Self-pay | Admitting: Internal Medicine

## 2022-02-15 NOTE — Assessment & Plan Note (Signed)
Benefits from CPAP and compliance and control seem good. Plan- [facility] to provide AirView or SD card so we can get downloads. Continue auto 5-20

## 2022-02-15 NOTE — Assessment & Plan Note (Signed)
Followed by Roger Mendoza

## 2022-03-03 ENCOUNTER — Ambulatory Visit (HOSPITAL_COMMUNITY)
Admission: RE | Admit: 2022-03-03 | Discharge: 2022-03-03 | Disposition: A | Discharge status: Home / Self Care | Payer: 59 | Source: Ambulatory Visit | Attending: Internal Medicine | Admitting: Internal Medicine

## 2022-03-03 DIAGNOSIS — C3490 Malignant neoplasm of unspecified part of unspecified bronchus or lung: Secondary | ICD-10-CM | POA: Insufficient documentation

## 2022-03-03 MED ORDER — IOHEXOL 300 MG/ML  SOLN
75.0000 mL | Freq: Once | INTRAMUSCULAR | Status: AC | PRN
  Administered 2022-03-03: 75 mL via INTRAVENOUS

## 2022-03-08 ENCOUNTER — Inpatient Hospital Stay: Payer: 59

## 2022-03-08 ENCOUNTER — Inpatient Hospital Stay: Payer: 59 | Admitting: Internal Medicine

## 2022-03-17 ENCOUNTER — Other Ambulatory Visit: Payer: Self-pay | Admitting: Physician Assistant

## 2022-03-17 DIAGNOSIS — C3490 Malignant neoplasm of unspecified part of unspecified bronchus or lung: Secondary | ICD-10-CM

## 2022-03-17 NOTE — Progress Notes (Deleted)
[place] OFFICE PROGRESS NOTE  [name], MD [address] [place] [address]  DIAGNOSIS: This is a very pleasant 62 years old African-American male diagnosed with stage Ia (T1b, N0, M0) non-small cell lung cancer, adenocarcinoma, diagnosed in February 2021  PRIOR THERAPY: Status post right lower lobectomy with lymph node sampling under the care of Dr. Kipp Brood on February 28, 2019   CURRENT THERAPY: Observation   INTERVAL HISTORY: Roger Mendoza. 62 y.o. male returns clinic for a 1 year follow-up visit.  The patient has a history of a stage I non-small cell lung cancer, adenocarcinoma that was diagnosed in February 2021.  The patient is status post surgical resection and has been on observation since that time.  He has been feeling well.  Today he denies any fever, chills, night sweats, or unexplained weight loss.  Denies any nausea, vomiting, diarrhea, or constipation.  Denies any headache or visual changes.  Denies any chest pain, shortness of breath, cough, or hemoptysis.  Still smoking?  He recently had a restaging CT scan performed.  He is here today for evaluation to review his scan results.     MEDICAL HISTORY: Past Medical History:  Diagnosis Date   Allergic rhinitis, seasonal    Allergy    seasonal   Cancer ([place])    lung -  60yrs ago- is not under any kind of treatment   Colon polyp 02/2012   tubular adenoma, Dr. Sharee Holster Quervain's tenosynovitis, right    hx/o   Epididymitis    hx/o as adult   Erectile dysfunction    Family history of premature CAD    father   Heart murmur    Hemorrhoids    hx of   Hyperlipidemia    Left inguinal hernia    tiny as of 03/2014   MRSA (methicillin resistant staph aureus) culture positive    history of MRSA skin on pt's back/ unsure of date   Normal cardiac stress test    remote past per patient, [place], [place]   Obesity    OSA on CPAP    Recurrent UTI    Dr. Janice Norrie, [facility]   Sleep apnea    uses c-pap   Tinea pedis    Tobacco use    4 cigarrettes daily   Wears glasses     ALLERGIES:  has No Known Allergies.  MEDICATIONS:  Current Outpatient Medications  Medication Sig Dispense Refill   acetaminophen (TYLENOL) 325 MG tablet Take 650 mg by mouth every 6 (six) hours as needed.     ASPIRIN LOW DOSE 81 MG EC tablet TAKE 1 TABLET BY MOUTH EVERY DAY 90 tablet 3   Cyanocobalamin (TH VITAMIN B12 PO) Take by mouth.     Glycerin-Hypromellose-PEG 400 (VISINE DRY EYE) 0.2-0.2-1 % SOLN Place 1-2 drops into both eyes 3 (three) times daily as needed (dry/irritated eyes).     hydrocortisone (PROCTOSOL HC) 2.5 % rectal cream Place 1 application rectally 2 (two) times daily. (Patient taking differently: Place 1 application  rectally 2 (two) times daily as needed for hemorrhoids.) 28.35 g 0   meloxicam (MOBIC) 7.5 MG tablet TAKE 1 TABLET BY MOUTH EVERY DAY AS NEEDED FOR PAIN 30 tablet 0   NON FORMULARY C PAP     Omega-3 Fatty Acids (FISH OIL PO) Take by mouth.     pramoxine-hydrocortisone (ANALPRAM HC) cream Apply topically 3 (three) times daily. 30 g 1   sildenafil (  REVATIO) 20 MG tablet TAKE 1 TO 5 TABLETS BY MOUTH DAILY AS NEEDED PRIOR TO SEXUAL ACTIVITY 50 tablet 2   tiZANidine (ZANAFLEX) 2 MG tablet TAKE 1 TO 2 TABLETS BY MOUTH AT BEDTIME AS NEEDED FOR MUSCLE SPASMS 180 tablet 1   traMADol (ULTRAM) 50 MG tablet Take 50 mg by mouth every 6 (six) hours as needed (pain).     Vitamin D, Ergocalciferol, (DRISDOL) 1.25 MG (50000 UNIT) CAPS capsule Take 1 capsule (50,000 Units total) by mouth every 7 (seven) days. 12 capsule 0   VITAMIN E PO Take by mouth.     No current facility-administered medications for this visit.    SURGICAL HISTORY:  Past Surgical History:  Procedure Laterality Date   ADENOIDECTOMY     BIOPSY  01/07/2021   Procedure: BIOPSY;  Surgeon: Jerene Bears, MD;  Location: [facility];  Service: Gastroenterology;;   CARDIAC CATHETERIZATION   02/11/2005   COLONOSCOPY  02/2012   Dr. Zenovia Jarred, repeat 02/2017   COLONOSCOPY WITH PROPOFOL N/A 01/07/2021   Procedure: COLONOSCOPY WITH PROPOFOL;  Surgeon: Jerene Bears, MD;  Location: [facility];  Service: Gastroenterology;  Laterality: N/A;   ESOPHAGEAL DILATION     INTERCOSTAL NERVE BLOCK Right 02/28/2019   Procedure: Intercostal Nerve Block;  Surgeon: Lajuana Matte, MD;  Location: [place];  Service: Thoracic;  Laterality: Right;   NODE DISSECTION Right 02/28/2019   Procedure: Node Dissection;  Surgeon: Lajuana Matte, MD;  Location: [place];  Service: Thoracic;  Laterality: Right;   POLYPECTOMY  01/07/2021   Procedure: POLYPECTOMY;  Surgeon: Jerene Bears, MD;  Location: [facility];  Service: Gastroenterology;;   [place]   left side   SKIN SURGERY     large area of MRSA on back/ over 10 years ago   [place] N/A 02/28/2019   Procedure: VIDEO BRONCHOSCOPY;  Surgeon: Lajuana Matte, MD;  Location: [facility] [place];  Service: Thoracic;  Laterality: N/A;    REVIEW OF SYSTEMS:   Review of Systems  Constitutional: Negative for appetite change, chills, fatigue, fever and unexpected weight change.  HENT:   Negative for mouth sores, nosebleeds, sore throat and trouble swallowing.   Eyes: Negative for eye problems and icterus.  Respiratory: Negative for cough, hemoptysis, shortness of breath and wheezing.   Cardiovascular: Negative for chest pain and leg swelling.  Gastrointestinal: Negative for abdominal pain, constipation, diarrhea, nausea and vomiting.  Genitourinary: Negative for bladder incontinence, difficulty urinating, dysuria, frequency and hematuria.   Musculoskeletal: Negative for back pain, gait problem, neck pain and neck stiffness.  Skin: Negative for itching and rash.  Neurological: Negative for dizziness, extremity weakness, gait problem, headaches, light-headedness and seizures.   Hematological: Negative for adenopathy. Does not bruise/bleed easily.  Psychiatric/Behavioral: Negative for confusion, depression and sleep disturbance. The patient is not nervous/anxious.     PHYSICAL EXAMINATION:  There were no vitals taken for this visit.  ECOG PERFORMANCE STATUS: {CHL ONC ECOG [id number]  Physical Exam  Constitutional: Oriented to person, place, and time and well-developed, well-nourished, and in no distress. No distress.  HENT:  Head: Normocephalic and atraumatic.  Mouth/Throat: Oropharynx is clear and moist. No oropharyngeal exudate.  Eyes: Conjunctivae are normal. Right eye exhibits no discharge. Left eye exhibits no discharge. No scleral icterus.  Neck: Normal range of motion. Neck supple.  Cardiovascular: Normal rate, regular rhythm, normal heart sounds and intact distal pulses.  Pulmonary/Chest: Effort normal and breath sounds normal. No respiratory distress. No wheezes. No rales.  Abdominal: Soft. Bowel sounds are normal. Exhibits no distension and no mass. There is no tenderness.  Musculoskeletal: Normal range of motion. Exhibits no edema.  Lymphadenopathy:    No cervical adenopathy.  Neurological: Alert and oriented to person, place, and time. Exhibits normal muscle tone. Gait normal. Coordination normal.  Skin: Skin is warm and dry. No rash noted. Not diaphoretic. No erythema. No pallor.  Psychiatric: Mood, memory and judgment normal.  Vitals reviewed.  LABORATORY DATA: Lab Results  Component Value Date   WBC 9.3 01/14/2022   HGB 15.1 01/14/2022   HCT 44.5 01/14/2022   MCV 91.0 01/14/2022   PLT 261.0 01/14/2022      Chemistry      Component Value Date/Time   NA 140 01/14/2022 1100   K 3.9 01/14/2022 1100   CL 103 01/14/2022 1100   CO2 28 01/14/2022 1100   BUN 13 01/14/2022 1100   CREATININE 1.05 01/14/2022 1100   CREATININE 1.03 03/08/2021 1356   CREATININE 1.15 05/11/2015 0001      Component Value Date/Time   CALCIUM 9.8  01/14/2022 1100   ALKPHOS 78 01/14/2022 1100   AST 32 01/14/2022 1100   AST 19 03/08/2021 1356   ALT 40 01/14/2022 1100   ALT 27 03/08/2021 1356   BILITOT 0.7 01/14/2022 1100   BILITOT 0.6 03/08/2021 1356       RADIOGRAPHIC STUDIES:  CT Chest W Contrast  Result Date: 03/05/2022 CLINICAL DATA:  Non-small cell right lower lobe lung cancer status post right lower lobectomy. Restaging. * Tracking Code: BO * EXAM: CT CHEST WITH CONTRAST TECHNIQUE: Multidetector CT imaging of the chest was performed during intravenous contrast administration. RADIATION DOSE REDUCTION: This exam was performed according to the departmental dose-optimization program which includes automated exposure control, adjustment of the mA and/or kV according to patient size and/or use of iterative reconstruction technique. CONTRAST:  51mL OMNIPAQUE IOHEXOL 300 MG/ML  SOLN COMPARISON:  04/16/2021 chest CT. FINDINGS: Cardiovascular: Normal heart size. No significant pericardial effusion/thickening. Left anterior descending coronary atherosclerosis. Atherosclerotic nonaneurysmal thoracic aorta. Dilated main pulmonary artery (3.5 cm diameter). No central pulmonary emboli. Mediastinum/Nodes: No significant thyroid nodules. Unremarkable esophagus. No pathologically enlarged axillary, mediastinal or hilar lymph nodes. Lungs/Pleura: No pneumothorax. No pleural effusion. Stable postsurgical changes from right lower lobectomy with minimal curvilinear parenchymal band in the basilar right upper lobe. No acute consolidative airspace disease, lung masses or significant pulmonary nodules. Upper abdomen: No acute abnormality. Musculoskeletal: No aggressive appearing focal osseous lesions. Mild thoracic spondylosis. IMPRESSION: 1. No evidence of local tumor recurrence in the right lung status post right lower lobectomy. 2. No evidence of metastatic disease in the chest. 3. Dilated main pulmonary artery, suggesting pulmonary arterial hypertension. 4.  One vessel coronary atherosclerosis. 5.  Aortic Atherosclerosis (ICD10-I70.0). Electronically Signed   By: Ilona Sorrel M.D.   On: 03/05/2022 16:50     ASSESSMENT/PLAN:  This is a very pleasant 62 year old African-American male diagnosed with stage Ia (T1b, N0, M0) non-small cell lung cancer, adenocarcinoma status post right lower lobectomy with lymph node sampling under the care of Dr. Kipp Brood on February 28, 2019   Patient is currently on observation and feeling well.  The patient recently had a restaging CT scan performed.  Dr. Julien Nordmann personally and independently reviewed the scan discussed results with the patient today.  The scan did not show any evidence of disease progression.  Dr. Julien Nordmann  recommends the patient continue on observation with a restaging CT scan of the chest in 1 year.  We will see him back for follow-up at that time to review his scan results.  Alcohol?  The patient was advised to call immediately if she has any concerning symptoms in the interval. The patient voices understanding of current disease status and treatment options and is in agreement with the current care plan. All questions were answered. The patient knows to call the clinic with any problems, questions or concerns. We can certainly see the patient much sooner if necessary    No orders of the defined types were placed in this encounter.    I spent {CHL ONC TIME VISIT - OFPUL:[id number]} counseling the patient face to face. The total time spent in the appointment was {CHL ONC TIME VISIT - ACQPE:[id number]}.  Rodricus Candelaria L Jerico Grisso, PA-C 03/17/22

## 2022-03-21 ENCOUNTER — Inpatient Hospital Stay: Payer: 59 | Attending: Nurse Practitioner

## 2022-03-21 ENCOUNTER — Inpatient Hospital Stay: Payer: 59 | Admitting: Physician Assistant

## 2022-04-08 NOTE — Progress Notes (Signed)
Roger Mendoza OFFICE PROGRESS NOTE  [name], MD [address] [place] [address]  DIAGNOSIS: Stage Ia (T1b, N0, M0) non-small cell lung cancer, adenocarcinoma status post right lower lobectomy with lymph node sampling under the care of Dr. Kipp Brood on February 28, 2019   PRIOR THERAPY: Status post right lower lobectomy with lymph node sampling under the care of Dr. Kipp Brood on February 28, 2019   CURRENT THERAPY: Observation   INTERVAL HISTORY: Roger Mendoza. 62 y.o. male returns to the clinic for a 1 year follow-up visit.  The patient was last seen by Dr. Julien Nordmann in February 2023.  The patient has a history of a stage Ia non-small cell lung cancer, adenocarcinoma.  He is status post lobectomy and has been on observation since 2021.  Overall, he is feeling fairly well today except chronic dyspnea on exertion. He does not smoke presently. Denies any fever, chills, or unexplained weight loss. He sometimes has sweats at night. Denies hormone use. He does mention he uses a lot of covers.  Denies any headache or visual changes.  Denies any nausea, vomiting, diarrhea, or constipation. He denies cough or hemoptysis. He recently had a restaging CT scan performed.  He is here today for evaluation to review his scan results.   MEDICAL HISTORY: Past Medical History:  Diagnosis Date   Allergic rhinitis, seasonal    Allergy    seasonal   Cancer ([place])    lung -  74yrs ago- is not under any kind of treatment   Colon polyp 02/2012   tubular adenoma, Dr. Sharee Holster Quervain's tenosynovitis, right    hx/o   Epididymitis    hx/o as adult   Erectile dysfunction    Family history of premature CAD    father   Heart murmur    Hemorrhoids    hx of   Hyperlipidemia    Left inguinal hernia    tiny as of 03/2014   MRSA (methicillin resistant staph aureus) culture positive    history of MRSA skin on pt's back/ unsure of date   Normal cardiac stress test     remote past per patient, [place], [place]   Obesity    OSA on CPAP    Recurrent UTI    Dr. Janice Norrie, [facility]   Sleep apnea    uses c-pap   Tinea pedis    Tobacco use    4 cigarrettes daily   Wears glasses     ALLERGIES:  has No Known Allergies.  MEDICATIONS:  Current Outpatient Medications  Medication Sig Dispense Refill   acetaminophen (TYLENOL) 325 MG tablet Take 650 mg by mouth every 6 (six) hours as needed.     ASPIRIN LOW DOSE 81 MG EC tablet TAKE 1 TABLET BY MOUTH EVERY DAY 90 tablet 3   Cyanocobalamin (TH VITAMIN B12 PO) Take by mouth.     Glycerin-Hypromellose-PEG 400 (VISINE DRY EYE) 0.2-0.2-1 % SOLN Place 1-2 drops into both eyes 3 (three) times daily as needed (dry/irritated eyes).     hydrocortisone (PROCTOSOL HC) 2.5 % rectal cream Place 1 application rectally 2 (two) times daily. (Patient taking differently: Place 1 application  rectally 2 (two) times daily as needed for hemorrhoids.) 28.35 g 0   meloxicam (MOBIC) 7.5 MG tablet TAKE 1 TABLET BY MOUTH EVERY DAY AS NEEDED FOR PAIN 30 tablet 0   NON FORMULARY C PAP     Omega-3 Fatty Acids (FISH OIL PO) Take  by mouth.     pramoxine-hydrocortisone (ANALPRAM HC) cream Apply topically 3 (three) times daily. 30 g 1   sildenafil (REVATIO) 20 MG tablet TAKE 1 TO 5 TABLETS BY MOUTH DAILY AS NEEDED PRIOR TO SEXUAL ACTIVITY 50 tablet 2   tiZANidine (ZANAFLEX) 2 MG tablet TAKE 1 TO 2 TABLETS BY MOUTH AT BEDTIME AS NEEDED FOR MUSCLE SPASMS 180 tablet 1   traMADol (ULTRAM) 50 MG tablet Take 50 mg by mouth every 6 (six) hours as needed (pain).     Vitamin D, Ergocalciferol, (DRISDOL) 1.25 MG (50000 UNIT) CAPS capsule Take 1 capsule (50,000 Units total) by mouth every 7 (seven) days. 12 capsule 0   VITAMIN E PO Take by mouth.     No current facility-administered medications for this visit.    SURGICAL HISTORY:  Past Surgical History:  Procedure Laterality Date   ADENOIDECTOMY     BIOPSY  01/07/2021   Procedure: BIOPSY;   Surgeon: Jerene Bears, MD;  Location: [facility];  Service: Gastroenterology;;   CARDIAC CATHETERIZATION  02/11/2005   COLONOSCOPY  02/2012   Dr. Zenovia Jarred, repeat 02/2017   COLONOSCOPY WITH PROPOFOL N/A 01/07/2021   Procedure: COLONOSCOPY WITH PROPOFOL;  Surgeon: Jerene Bears, MD;  Location: [facility];  Service: Gastroenterology;  Laterality: N/A;   ESOPHAGEAL DILATION     INTERCOSTAL NERVE BLOCK Right 02/28/2019   Procedure: Intercostal Nerve Block;  Surgeon: Lajuana Matte, MD;  Location: [place];  Service: Thoracic;  Laterality: Right;   NODE DISSECTION Right 02/28/2019   Procedure: Node Dissection;  Surgeon: Lajuana Matte, MD;  Location: [place];  Service: Thoracic;  Laterality: Right;   POLYPECTOMY  01/07/2021   Procedure: POLYPECTOMY;  Surgeon: Jerene Bears, MD;  Location: [facility];  Service: Gastroenterology;;   [place]   left side   SKIN SURGERY     large area of MRSA on back/ over 10 years ago   [place] N/A 02/28/2019   Procedure: VIDEO BRONCHOSCOPY;  Surgeon: Lajuana Matte, MD;  Location: [place];  Service: Thoracic;  Laterality: N/A;    REVIEW OF SYSTEMS:   Review of Systems  Constitutional: Negative for appetite change, chills, fatigue, fever and unexpected weight change.  HENT: Negative for mouth sores, nosebleeds, sore throat and trouble swallowing.   Eyes: Negative for eye problems and icterus.  Respiratory: Positive for dyspnea on exertion. Negative for cough, hemoptysis,  and wheezing.   Cardiovascular: Negative for chest pain and leg swelling.  Gastrointestinal: Negative for abdominal pain, constipation, diarrhea, nausea and vomiting.  Genitourinary: Negative for bladder incontinence, difficulty urinating, dysuria, frequency and hematuria.   Musculoskeletal: Negative for back pain, gait problem, neck pain and neck stiffness.  Skin: Negative for itching and rash.   Neurological: Negative for dizziness, extremity weakness, gait problem, headaches, light-headedness and seizures.  Hematological: Negative for adenopathy. Does not bruise/bleed easily.  Psychiatric/Behavioral: Negative for confusion, depression and sleep disturbance. The patient is not nervous/anxious.     PHYSICAL EXAMINATION:  Blood pressure (!) 131/57, pulse 83, temperature 97.9 F (36.6 C), temperature source Oral, resp. rate 16, weight (!) 318 lb 6.4 oz (144.4 kg), SpO2 98 %.  ECOG PERFORMANCE STATUS: 0  Physical Exam  Constitutional: Oriented to person, place, and time and well-developed, well-nourished, and in no distress.  HENT:  Head: Normocephalic and atraumatic.  Mouth/Throat: Oropharynx is clear and moist. No oropharyngeal exudate.  Eyes: Conjunctivae are normal. Right eye exhibits no discharge. Left eye exhibits no discharge. No scleral icterus.  Neck: Normal range of motion. Neck supple.  Cardiovascular: Normal rate, regular rhythm, normal heart sounds and intact distal pulses.   Pulmonary/Chest: Effort normal and breath sounds normal. No respiratory distress. No wheezes. No rales.  Abdominal: Soft. Bowel sounds are normal. Exhibits no distension and no mass. There is no tenderness.  Musculoskeletal: Normal range of motion. Exhibits no edema.  Lymphadenopathy:    No cervical adenopathy.  Neurological: Alert and oriented to person, place, and time. Exhibits normal muscle tone. Gait normal. Coordination normal.  Skin: Skin is warm and dry. No rash noted. Not diaphoretic. No erythema. No pallor.  Psychiatric: Mood, memory and judgment normal.  Vitals reviewed.  LABORATORY DATA: Lab Results  Component Value Date   WBC 11.0 (H) 04/12/2022   HGB 14.4 04/12/2022   HCT 42.3 04/12/2022   MCV 90.2 04/12/2022   PLT 244 04/12/2022      Chemistry      Component Value Date/Time   NA 137 04/12/2022 1452   K 3.9 04/12/2022 1452   CL 102 04/12/2022 1452   CO2 28 04/12/2022  1452   BUN 11 04/12/2022 1452   CREATININE 0.96 04/12/2022 1452   CREATININE 1.15 05/11/2015 0001      Component Value Date/Time   CALCIUM 9.7 04/12/2022 1452   ALKPHOS 73 04/12/2022 1452   AST 26 04/12/2022 1452   ALT 35 04/12/2022 1452   BILITOT 0.5 04/12/2022 1452       RADIOGRAPHIC STUDIES:  No results found.   ASSESSMENT/PLAN:  This is a very pleasant 61 year old African-American male with a history of stage Ia (T1b, N0, M0) non-small cell lung cancer, adenocarcinoma.  He is status post a right lower lobectomy with lymph node sampling under the care of Dr. Kipp Brood on 02/28/2019.  The patient has been on observation since that time.  The patient recently in a restaging CT scan performed.  Dr. Julien Nordmann personally independently reviewed the scan discussed the results with the patient today.  Scan did not show any evidence of disease progression.  Dr. Julien Nordmann recommends that the patient continue on observation with repeat CT scan of the chest in 1 year.  We will see him back for follow-up visit in 1 year for evaluation and to review his scan results at that time.  His scan incidentally mentioned some suggestion of increased PA pressure, he will continue to follow with his cardiologist.  I believe some of his dyspnea may be related to deconditioning and obesity. He was encouraged to increase his exercise tolerance. If he has limitations due to joint pain, discussed stationary bikes and pool exercises are good.   The patient was advised to call immediately if she has any concerning symptoms in the interval. The patient voices understanding of current disease status and treatment options and is in agreement with the current care plan. All questions were answered. The patient knows to call the clinic with any problems, questions or concerns. We can certainly see the patient much sooner if necessary   Orders Placed This Encounter  Procedures   CT Chest W Contrast    Standing  Status:   Future    Standing Expiration Date:   04/12/2023    Order Specific Question:   If indicated for the ordered procedure, I authorize the administration of contrast media per Radiology protocol    Answer:   Yes    Order Specific Question:  Does the patient have a contrast media/X-ray dye allergy?    Answer:   No    Order Specific Question:   Preferred imaging location?    Answer:   [facility]   CBC with Differential ([place] Only)    Standing Status:   Future    Standing Expiration Date:   04/12/2023   CMP ([place] only)    Standing Status:   Future    Standing Expiration Date:   04/12/2023     Tobe Sos Elica Almas, PA-C 04/12/22  ADDENDUM: Hematology/Oncology Attending: I had a face-to-face encounter with the patient today.  I reviewed his record, lab, scan and recommended his care plan. This is a very pleasant 62 years old African-American male with stage Ia non-small cell lung cancer, adenocarcinoma status post right lower lobectomy with lymph node sampling under the care of Dr. Kipp Brood in February 2021.  The patient is currently on observation and he is feeling fine with no concerning complaints. He had repeat CT scan of the chest performed recently.  I personally and independently reviewed the scan and discussed the result with the patient today. His scan showed no concerning findings for disease recurrence or metastasis. I recommended for the patient to continue on observation with repeat CT scan of the chest in 1 year. The patient was advised to call immediately if he has any concerning symptoms in the interval. The total time spent in the appointment was 20 minutes. Disclaimer: This note was dictated with voice recognition software. Similar sounding words can inadvertently be transcribed and may be missed upon review. Eilleen Kempf, MD

## 2022-04-12 ENCOUNTER — Inpatient Hospital Stay: Payer: 59 | Admitting: Physician Assistant

## 2022-04-12 ENCOUNTER — Other Ambulatory Visit: Payer: Self-pay

## 2022-04-12 ENCOUNTER — Inpatient Hospital Stay: Payer: 59 | Attending: Nurse Practitioner

## 2022-04-12 VITALS — BP 131/57 | HR 83 | Temp 97.9°F | Resp 16 | Wt 318.4 lb

## 2022-04-12 DIAGNOSIS — C3490 Malignant neoplasm of unspecified part of unspecified bronchus or lung: Secondary | ICD-10-CM

## 2022-04-12 DIAGNOSIS — Z85118 Personal history of other malignant neoplasm of bronchus and lung: Secondary | ICD-10-CM | POA: Diagnosis present

## 2022-04-12 DIAGNOSIS — Z08 Encounter for follow-up examination after completed treatment for malignant neoplasm: Secondary | ICD-10-CM | POA: Insufficient documentation

## 2022-04-12 LAB — CBC WITH DIFFERENTIAL (CANCER CENTER ONLY)
Abs Immature Granulocytes: 0.09 10*3/uL — ABNORMAL HIGH (ref 0.00–0.07)
Basophils Absolute: 0.1 10*3/uL (ref 0.0–0.1)
Basophils Relative: 1 %
Eosinophils Absolute: 0.3 10*3/uL (ref 0.0–0.5)
Eosinophils Relative: 3 %
HCT: 42.3 % (ref 39.0–52.0)
Hemoglobin: 14.4 g/dL (ref 13.0–17.0)
Immature Granulocytes: 1 %
Lymphocytes Relative: 29 %
Lymphs Abs: 3.2 10*3/uL (ref 0.7–4.0)
MCH: 30.7 pg (ref 26.0–34.0)
MCHC: 34 g/dL (ref 30.0–36.0)
MCV: 90.2 fL (ref 80.0–100.0)
Monocytes Absolute: 0.8 10*3/uL (ref 0.1–1.0)
Monocytes Relative: 7 %
Neutro Abs: 6.6 10*3/uL (ref 1.7–7.7)
Neutrophils Relative %: 59 %
Platelet Count: 244 10*3/uL (ref 150–400)
RBC: 4.69 MIL/uL (ref 4.22–5.81)
RDW: 12.3 % (ref 11.5–15.5)
WBC Count: 11 10*3/uL — ABNORMAL HIGH (ref 4.0–10.5)
nRBC: 0 % (ref 0.0–0.2)

## 2022-04-12 LAB — CMP (CANCER CENTER ONLY)
ALT: 35 U/L (ref 0–44)
AST: 26 U/L (ref 15–41)
Albumin: 4.9 g/dL (ref 3.5–5.0)
Alkaline Phosphatase: 73 U/L (ref 38–126)
Anion gap: 7 (ref 5–15)
BUN: 11 mg/dL (ref 8–23)
CO2: 28 mmol/L (ref 22–32)
Calcium: 9.7 mg/dL (ref 8.9–10.3)
Chloride: 102 mmol/L (ref 98–111)
Creatinine: 0.96 mg/dL (ref 0.61–1.24)
GFR, Estimated: >60 mL/min (ref >60)
Glucose, Bld: 103 mg/dL — ABNORMAL HIGH (ref 70–99)
Potassium: 3.9 mmol/L (ref 3.5–5.1)
Sodium: 137 mmol/L (ref 135–145)
Total Bilirubin: 0.5 mg/dL (ref 0.3–1.2)
Total Protein: 8 g/dL (ref 6.5–8.1)

## 2022-04-29 ENCOUNTER — Ambulatory Visit: Payer: 59 | Admitting: Thoracic Surgery (Cardiothoracic Vascular Surgery)

## 2022-05-06 ENCOUNTER — Ambulatory Visit: Payer: 59 | Admitting: Thoracic Surgery (Cardiothoracic Vascular Surgery)

## 2022-05-06 VITALS — BP 135/85 | HR 88 | Resp 20 | Ht 73.0 in | Wt 309.0 lb

## 2022-05-06 DIAGNOSIS — Z902 Acquired absence of lung [part of]: Secondary | ICD-10-CM | POA: Diagnosis not present

## 2022-05-06 DIAGNOSIS — Z85118 Personal history of other malignant neoplasm of bronchus and lung: Secondary | ICD-10-CM

## 2022-05-08 NOTE — Progress Notes (Signed)
      [address]       [place] [address]             [phone]        Laurian Brim. [place] Medical Record #[id number] Date of Birth: 1960-10-18  Referring: Deeann Saint, MD Primary Care: Deeann Saint, MD Primary Cardiologist:None  Reason for visit:   follow-up  History of Present Illness:     62yo male presents for his annual follow.  Overall he is doing well.  He has no complains  Physical Exam: BP 135/85 (BP Location: Left Arm, Patient Position: Sitting)   Pulse 88   Resp 20   Ht 6\' 1"  (1.854 m)   Wt (!) 309 lb (140.2 kg)   SpO2 95% Comment: RA  BMI 40.77 kg/m   Alert NAD Abdomen, ND no peripheral edema   Diagnostic Studies & Laboratory data: CT Chest: IMPRESSION: 1. No evidence of local tumor recurrence in the right lung status post right lower lobectomy. 2. No evidence of metastatic disease in the chest. 3. Dilated main pulmonary artery, suggesting pulmonary arterial hypertension. 4. One vessel coronary atherosclerosis. 5.  Aortic Atherosclerosis (ICD10-I70.0).     Electronically Signed   By: Delbert Phenix M.D.   On: 03/05/2022 16:50    Assessment / Plan:   62 year old male status post right lower lobectomy for stage I adenocarcinoma. Overall doing well. There is no evidence of any recurrent or metastatic disease on his cross-sectional imaging from today. His original surgery was in February 2021. He will continue his annual surveillance CT scans. He will continue to follow-up at the [facility]  Corliss Skains 05/08/2022 3:53 PM

## 2022-08-24 ENCOUNTER — Encounter (INDEPENDENT_AMBULATORY_CARE_PROVIDER_SITE_OTHER): Payer: Self-pay

## 2022-09-19 ENCOUNTER — Ambulatory Visit (INDEPENDENT_AMBULATORY_CARE_PROVIDER_SITE_OTHER): Payer: 59 | Admitting: Family Medicine

## 2022-09-19 ENCOUNTER — Encounter: Payer: Self-pay | Admitting: Family Medicine

## 2022-09-19 VITALS — BP 120/74 | HR 85 | Temp 98.3°F | Ht 73.0 in | Wt 318.4 lb

## 2022-09-19 DIAGNOSIS — R300 Dysuria: Secondary | ICD-10-CM | POA: Diagnosis not present

## 2022-09-19 DIAGNOSIS — Z0001 Encounter for general adult medical examination with abnormal findings: Secondary | ICD-10-CM | POA: Diagnosis not present

## 2022-09-19 DIAGNOSIS — Z125 Encounter for screening for malignant neoplasm of prostate: Secondary | ICD-10-CM | POA: Diagnosis not present

## 2022-09-19 DIAGNOSIS — Z131 Encounter for screening for diabetes mellitus: Secondary | ICD-10-CM

## 2022-09-19 DIAGNOSIS — I700 Atherosclerosis of aorta: Secondary | ICD-10-CM | POA: Diagnosis not present

## 2022-09-19 DIAGNOSIS — E782 Mixed hyperlipidemia: Secondary | ICD-10-CM

## 2022-09-19 LAB — COMPREHENSIVE METABOLIC PANEL
ALT: 31 U/L (ref 0–53)
AST: 26 U/L (ref 0–37)
Albumin: 4.4 g/dL (ref 3.5–5.2)
Alkaline Phosphatase: 72 U/L (ref 39–117)
BUN: 12 mg/dL (ref 6–23)
CO2: 23 mEq/L (ref 19–32)
Calcium: 9.1 mg/dL (ref 8.4–10.5)
Chloride: 104 mEq/L (ref 96–112)
Creatinine, Ser: 0.96 mg/dL (ref 0.40–1.50)
GFR: 84.52 mL/min (ref >60.00)
Glucose, Bld: 106 mg/dL — ABNORMAL HIGH (ref 70–99)
Potassium: 3.7 mEq/L (ref 3.5–5.1)
Sodium: 141 mEq/L (ref 135–145)
Total Bilirubin: 0.9 mg/dL (ref 0.2–1.2)
Total Protein: 7.2 g/dL (ref 6.0–8.3)

## 2022-09-19 LAB — CBC WITH DIFFERENTIAL/PLATELET
Basophils Absolute: 0 10*3/uL (ref 0.0–0.1)
Basophils Relative: 0.2 % (ref 0.0–3.0)
Eosinophils Absolute: 0.3 10*3/uL (ref 0.0–0.7)
Eosinophils Relative: 4 % (ref 0.0–5.0)
HCT: 42 % (ref 39.0–52.0)
Hemoglobin: 13.9 g/dL (ref 13.0–17.0)
Lymphocytes Relative: 27.7 % (ref 12.0–46.0)
Lymphs Abs: 2.2 10*3/uL (ref 0.7–4.0)
MCHC: 33 g/dL (ref 30.0–36.0)
MCV: 92.7 fl (ref 78.0–100.0)
Monocytes Absolute: 0.6 10*3/uL (ref 0.1–1.0)
Monocytes Relative: 7.3 % (ref 3.0–12.0)
Neutro Abs: 4.8 10*3/uL (ref 1.4–7.7)
Neutrophils Relative %: 60.8 % (ref 43.0–77.0)
Platelets: 223 10*3/uL (ref 150.0–400.0)
RBC: 4.53 Mil/uL (ref 4.22–5.81)
RDW: 13.3 % (ref 11.5–15.5)
WBC: 7.8 10*3/uL (ref 4.0–10.5)

## 2022-09-19 LAB — T4, FREE: Free T4: 0.86 ng/dL (ref 0.60–1.60)

## 2022-09-19 LAB — PSA: PSA: 0.67 ng/mL (ref 0.10–4.00)

## 2022-09-19 LAB — LIPID PANEL
Cholesterol: 181 mg/dL (ref 0–200)
HDL: 38 mg/dL — ABNORMAL LOW (ref >39.00)
NonHDL: 142.8
Total CHOL/HDL Ratio: 5
Triglycerides: 259 mg/dL — ABNORMAL HIGH (ref 0.0–149.0)
VLDL: 51.8 mg/dL — ABNORMAL HIGH (ref 0.0–40.0)

## 2022-09-19 LAB — POCT URINALYSIS DIPSTICK
Bilirubin, UA: NEGATIVE
Blood, UA: NEGATIVE
Glucose, UA: NEGATIVE
Ketones, UA: NEGATIVE
Leukocytes, UA: NEGATIVE
Nitrite, UA: NEGATIVE
Protein, UA: POSITIVE — AB
Spec Grav, UA: 1.02 (ref 1.010–1.025)
Urobilinogen, UA: NEGATIVE E.U./dL — AB
pH, UA: 6 (ref 5.0–8.0)

## 2022-09-19 LAB — TSH: TSH: 1.89 u[IU]/mL (ref 0.35–5.50)

## 2022-09-19 LAB — HEMOGLOBIN A1C: Hgb A1c MFr Bld: 5.3 % (ref 4.6–6.5)

## 2022-09-19 LAB — LDL CHOLESTEROL, DIRECT: Direct LDL: 113 mg/dL

## 2022-09-19 NOTE — Progress Notes (Signed)
Established Patient Office Visit   Subjective  Patient ID: Roger Klaes., male    DOB: 09/03/1960  Age: 62 y.o. MRN: [id number]  Chief Complaint  Patient presents with   Annual Exam    Patient is having some lower right side back pain, started 2 months ago,     Patient is a 62 year old male seen for CPE and acute issues.  Patient endorses right-sided low back pain and dysuria, intermittent x 1 mo. patient denies fever, chills, nausea, vomiting, constipation, discharge.  In the past seen by urology for BPH.    Past Medical History:  Diagnosis Date   Allergic rhinitis, seasonal    Allergy    seasonal   Cancer ([facility])    lung -  74yrs ago- is not under any kind of treatment   Colon polyp 02/2012   tubular adenoma, Dr. Lottie Mussel Quervain's tenosynovitis, right    hx/o   Epididymitis    hx/o as adult   Erectile dysfunction    Family history of premature CAD    father   Heart murmur    Hemorrhoids    hx of   Hyperlipidemia    Left inguinal hernia    tiny as of 03/2014   MRSA (methicillin resistant staph aureus) culture positive    history of MRSA skin on pt's back/ unsure of date   Normal cardiac stress test    remote past per patient, [place], [place]   Obesity    OSA on CPAP    Recurrent UTI    Dr. Brunilda Payor, [facility]   Sleep apnea    uses c-pap   Tinea pedis    Tobacco use    4 cigarrettes daily   Wears glasses    Past Surgical History:  Procedure Laterality Date   ADENOIDECTOMY     BIOPSY  01/07/2021   Procedure: BIOPSY;  Surgeon: Beverley Fiedler, MD;  [facility];  Service: Gastroenterology;;   CARDIAC CATHETERIZATION  02/11/2005   COLONOSCOPY  02/2012   Dr. Erick Blinks, repeat 02/2017   COLONOSCOPY WITH PROPOFOL N/A 01/07/2021   Procedure: COLONOSCOPY WITH PROPOFOL;  Surgeon: Beverley Fiedler, MD;  Location: [facility];  Service: Gastroenterology;  Laterality: N/A;   ESOPHAGEAL DILATION     INTERCOSTAL NERVE BLOCK Right 02/28/2019    Procedure: Intercostal Nerve Block;  Surgeon: Corliss Skains, MD;  Location: [facility];  Service: Thoracic;  Laterality: Right;   NODE DISSECTION Right 02/28/2019   Procedure: Node Dissection;  Surgeon: Corliss Skains, MD;  Location: [facility];  Service: Thoracic;  Laterality: Right;   POLYPECTOMY  01/07/2021   Procedure: POLYPECTOMY;  Surgeon: Beverley Fiedler, MD;  [facility];  Service: Gastroenterology;;   ROTATOR CUFF REPAIR  1997   left side   SKIN SURGERY     large area of MRSA on back/ over 10 years ago   TONSILLECTOMY     UPPER GASTROINTESTINAL ENDOSCOPY     VIDEO BRONCHOSCOPY N/A 02/28/2019   Procedure: VIDEO BRONCHOSCOPY;  Surgeon: Corliss Skains, MD;  Location: [facility];  Service: Thoracic;  Laterality: N/A;   Social History   Tobacco Use   Smoking status: Former    Current packs/day: 0.00    Average packs/day: 0.3 packs/day for 15.0 years (3.8 ttl pk-yrs)    Types: Cigarettes    Start date: 03/31/1999    Quit date: 03/31/2014    Years since quitting: 8.4    Passive exposure:  Past   Smokeless tobacco: Never  Vaping Use   Vaping status: Never Used  Substance Use Topics   Alcohol use: Yes    Alcohol/week: 15.0 standard drinks of alcohol    Types: 3 Glasses of wine, 12 Cans of beer per week    Comment: couple glasses of wine per day   Drug use: No   Family History  Problem Relation Age of Onset   Hypertension Mother    Arthritis Mother    Hip dysplasia Mother    Heart disease Mother    Heart disease Father 17       died of MI   Hyperlipidemia Father    HIV Brother    Colon cancer Neg Hx    Esophageal cancer Neg Hx    Rectal cancer Neg Hx    Stomach cancer Neg Hx    Cancer Neg Hx    Diabetes Neg Hx    Stroke Neg Hx    Colitis Neg Hx    No Known Allergies    ROS Negative unless stated above    Objective:     BP 120/74 (BP Location: Right Arm, Patient Position: Sitting, Cuff Size: Large)   Pulse 85   Temp 98.3 F (36.8 C) (Oral)   Ht 6'  1" (1.854 m)   Wt (!) 318 lb 6.4 oz (144.4 kg)   SpO2 96%   BMI 42.01 kg/m  BP Readings from Last 3 Encounters:  09/19/22 120/74  05/06/22 135/85  04/12/22 (!) 131/57   Wt Readings from Last 3 Encounters:  09/19/22 (!) 318 lb 6.4 oz (144.4 kg)  05/06/22 (!) 309 lb (140.2 kg)  04/12/22 (!) 318 lb 6.4 oz (144.4 kg)      Physical Exam Constitutional:      Appearance: Normal appearance.  HENT:     Head: Normocephalic and atraumatic.     Right Ear: Tympanic membrane, ear canal and external ear normal.     Left Ear: Tympanic membrane, ear canal and external ear normal.     Nose: Nose normal.     Mouth/Throat:     Mouth: Mucous membranes are moist.     Pharynx: No oropharyngeal exudate or posterior oropharyngeal erythema.  Eyes:     General: No scleral icterus.    Extraocular Movements: Extraocular movements intact.     Conjunctiva/sclera: Conjunctivae normal.     Pupils: Pupils are equal, round, and reactive to light.  Neck:     Thyroid: No thyromegaly.  Cardiovascular:     Rate and Rhythm: Normal rate and regular rhythm.     Pulses: Normal pulses.     Heart sounds: Normal heart sounds. No murmur heard.    No friction rub.  Pulmonary:     Effort: Pulmonary effort is normal.     Breath sounds: Normal breath sounds. No wheezing, rhonchi or rales.  Abdominal:     General: Bowel sounds are normal.     Palpations: Abdomen is soft.     Tenderness: There is no abdominal tenderness.  Musculoskeletal:        General: No deformity. Normal range of motion.  Lymphadenopathy:     Cervical: No cervical adenopathy.  Skin:    General: Skin is warm and dry.     Findings: No lesion.  Neurological:     General: No focal deficit present.     Mental Status: He is alert and oriented to person, place, and time.  Psychiatric:  Mood and Affect: Mood normal.        Thought Content: Thought content normal.       09/19/2022    9:14 AM 02/04/2022    4:13 PM 09/17/2021   10:35 AM  05/19/2021    4:34 PM 08/21/2020    8:16 AM  Depression screen PHQ 2/9  Decreased Interest 0 0 0 2 0  Down, Depressed, Hopeless 0 0 0 0 0  PHQ - 2 Score 0 0 0 2 0  Altered sleeping 0 0 0 0 0  Tired, decreased energy 0 0 1 2 1   Change in appetite 0 0 0 0 0  Feeling bad or failure about yourself  0 0 0 0 0  Trouble concentrating 0 0 0 0 0  Moving slowly or fidgety/restless 0 0 0 0 0  Suicidal thoughts 0 0 0 0 0  PHQ-9 Score 0 0 1 4 1   Difficult doing work/chores Not difficult at all Not difficult at all Not difficult at all Not difficult at all Somewhat difficult      09/19/2022    9:14 AM 08/21/2020    8:17 AM  GAD 7 : Generalized Anxiety Score  Nervous, Anxious, on Edge  0  Control/stop worrying 0 0  Worry too much - different things 0 0  Trouble relaxing 0 0  Restless 0 0  Easily annoyed or irritable 0 0  Afraid - awful might happen 0 0  Total GAD 7 Score  0  Anxiety Difficulty Not difficult at all       Results for orders placed or performed in visit on 09/19/22  POCT urinalysis dipstick  Result Value Ref Range   Color, UA yelow    Clarity, UA clear    Glucose, UA Negative Negative   Bilirubin, UA neg    Ketones, UA neg    Spec Grav, UA 1.020 1.010 - 1.025   Blood, UA neg    pH, UA 6.0 5.0 - 8.0   Protein, UA Positive (A) Negative   Urobilinogen, UA negative (A) 0.2 or 1.0 E.U./dL   Nitrite, UA neg    Leukocytes, UA Negative Negative   Appearance     Odor        Assessment & Plan:  Encounter for routine adult health examination with abnormal findings -     CBC with Differential/Platelet -     TSH -     T4, free -     Hemoglobin A1c  Mixed hyperlipidemia -     Comprehensive metabolic panel -     Lipid panel  Aortic atherosclerosis (HCC) -     Comprehensive metabolic panel -     Lipid panel  Dysuria -     Comprehensive metabolic panel -     POCT urinalysis dipstick  Screening for prostate cancer -     PSA  Age-appropriate health screenings  discussed.  Immunizations reviewed.  Will obtain labs.  Colonoscopy done 01/07/2021.  Next CPE in 1 year.  UA negative for overt signs of infection.  Will have patient schedule follow-up with his urologist.  No follow-ups on file.   Deeann Saint, MD

## 2022-09-19 NOTE — Patient Instructions (Signed)
Urine does not show any signs of infection.  Given her prior history of similar symptoms you should schedule an appointment to see your urologist.  Make sure you are drinking plenty of water and fluids daily.

## 2022-10-03 ENCOUNTER — Telehealth: Payer: Self-pay | Admitting: Family Medicine

## 2022-10-03 NOTE — Telephone Encounter (Signed)
Pt requesting a call regarding lab results.

## 2023-04-03 ENCOUNTER — Ambulatory Visit (HOSPITAL_COMMUNITY)
Admission: RE | Admit: 2023-04-03 | Discharge: 2023-04-03 | Disposition: A | Discharge status: Home / Self Care | Payer: 59 | Source: Ambulatory Visit | Attending: Physician Assistant | Admitting: Physician Assistant

## 2023-04-03 ENCOUNTER — Inpatient Hospital Stay: Payer: 59 | Attending: Internal Medicine

## 2023-04-03 DIAGNOSIS — Z85118 Personal history of other malignant neoplasm of bronchus and lung: Secondary | ICD-10-CM | POA: Insufficient documentation

## 2023-04-03 DIAGNOSIS — C3490 Malignant neoplasm of unspecified part of unspecified bronchus or lung: Secondary | ICD-10-CM | POA: Insufficient documentation

## 2023-04-03 DIAGNOSIS — K769 Liver disease, unspecified: Secondary | ICD-10-CM | POA: Diagnosis not present

## 2023-04-03 DIAGNOSIS — Z902 Acquired absence of lung [part of]: Secondary | ICD-10-CM | POA: Insufficient documentation

## 2023-04-03 DIAGNOSIS — R0789 Other chest pain: Secondary | ICD-10-CM | POA: Diagnosis not present

## 2023-04-03 DIAGNOSIS — R0602 Shortness of breath: Secondary | ICD-10-CM | POA: Diagnosis not present

## 2023-04-03 LAB — CBC WITH DIFFERENTIAL (CANCER CENTER ONLY)
Abs Immature Granulocytes: 0.08 10*3/uL — ABNORMAL HIGH (ref 0.00–0.07)
Basophils Absolute: 0 10*3/uL (ref 0.0–0.1)
Basophils Relative: 0 %
Eosinophils Absolute: 0.3 10*3/uL (ref 0.0–0.5)
Eosinophils Relative: 3 %
HCT: 39.2 % (ref 39.0–52.0)
Hemoglobin: 13.4 g/dL (ref 13.0–17.0)
Immature Granulocytes: 1 %
Lymphocytes Relative: 28 %
Lymphs Abs: 2.7 10*3/uL (ref 0.7–4.0)
MCH: 30.6 pg (ref 26.0–34.0)
MCHC: 34.2 g/dL (ref 30.0–36.0)
MCV: 89.5 fL (ref 80.0–100.0)
Monocytes Absolute: 0.7 10*3/uL (ref 0.1–1.0)
Monocytes Relative: 7 %
Neutro Abs: 5.8 10*3/uL (ref 1.7–7.7)
Neutrophils Relative %: 61 %
Platelet Count: 233 10*3/uL (ref 150–400)
RBC: 4.38 MIL/uL (ref 4.22–5.81)
RDW: 12.3 % (ref 11.5–15.5)
WBC Count: 9.5 10*3/uL (ref 4.0–10.5)
nRBC: 0 % (ref 0.0–0.2)

## 2023-04-03 LAB — CMP (CANCER CENTER ONLY)
ALT: 36 U/L (ref 0–44)
AST: 28 U/L (ref 15–41)
Albumin: 4.7 g/dL (ref 3.5–5.0)
Alkaline Phosphatase: 67 U/L (ref 38–126)
Anion gap: 7 (ref 5–15)
BUN: 12 mg/dL (ref 8–23)
CO2: 27 mmol/L (ref 22–32)
Calcium: 8.7 mg/dL — ABNORMAL LOW (ref 8.9–10.3)
Chloride: 106 mmol/L (ref 98–111)
Creatinine: 0.94 mg/dL (ref 0.61–1.24)
GFR, Estimated: >60 mL/min (ref >60)
Glucose, Bld: 131 mg/dL — ABNORMAL HIGH (ref 70–99)
Potassium: 4.4 mmol/L (ref 3.5–5.1)
Sodium: 140 mmol/L (ref 135–145)
Total Bilirubin: 0.6 mg/dL (ref 0.0–1.2)
Total Protein: 7.5 g/dL (ref 6.5–8.1)

## 2023-04-03 MED ORDER — IOHEXOL 300 MG/ML  SOLN
75.0000 mL | Freq: Once | INTRAMUSCULAR | Status: AC | PRN
  Administered 2023-04-03: 80 mL via INTRAVENOUS

## 2023-04-05 ENCOUNTER — Inpatient Hospital Stay: Payer: 59 | Admitting: Internal Medicine

## 2023-04-05 ENCOUNTER — Ambulatory Visit: Payer: 59 | Admitting: Internal Medicine

## 2023-04-05 VITALS — BP 146/61 | HR 85 | Temp 98.0°F | Resp 17 | Ht 73.0 in | Wt 308.0 lb

## 2023-04-05 DIAGNOSIS — Z85118 Personal history of other malignant neoplasm of bronchus and lung: Secondary | ICD-10-CM | POA: Diagnosis not present

## 2023-04-05 DIAGNOSIS — C3490 Malignant neoplasm of unspecified part of unspecified bronchus or lung: Secondary | ICD-10-CM

## 2023-04-05 NOTE — Progress Notes (Signed)
 [facility] [phone]   [phone]  OFFICE PROGRESS NOTE  Deeann Saint, MD [address] [place] [address]  DIAGNOSIS: Stage Ia (T1b, N0, M0) non-small cell lung cancer, adenocarcinoma status post right lower lobectomy with lymph node sampling under the care of Dr. Cliffton Asters on February 28, 2019    PRIOR THERAPY: Status post right lower lobectomy with lymph node sampling under the care of Dr. Cliffton Asters on February 28, 2019    CURRENT THERAPY: Observation   INTERVAL HISTORY: Roger Mendoza. 63 y.o. male returns to the clinic today for annual follow-up visit.Discussed the use of AI scribe software for clinical note transcription with the patient, who gave verbal consent to proceed.  History of Present Illness   The patient is a 63 year old with stage 1A non-small cell lung cancer adenocarcinoma who presents for routine follow-up.  Diagnosed with stage 1A non-small cell lung cancer adenocarcinoma in February 2021, he underwent a right lower lobectomy with lymph node sampling. Since then, he has been monitored with annual scans. The most recent scan was performed two days ago, and he is awaiting the radiologist's report.  He experiences intermittent chest pain that comes and goes. No acute distress is noted during these episodes.  He has shortness of breath, which he attributes to being out of shape. He is attempting to resume exercise to improve his fitness and manage his blood pressure.  He continues to work in the [employer].       MEDICAL HISTORY: Past Medical History:  Diagnosis Date   Allergic rhinitis, seasonal    Allergy    seasonal   Cancer (HCC)    lung -  63yrs ago- is not under any kind of treatment   Colon polyp 02/2012   tubular adenoma, Dr. Lottie Mussel Quervain's tenosynovitis, right    hx/o   Epididymitis    hx/o as adult   Erectile dysfunction    Family history of premature CAD     father   Heart murmur    Hemorrhoids    hx of   Hyperlipidemia    Left inguinal hernia    tiny as of 03/2014   MRSA (methicillin resistant staph aureus) culture positive    history of MRSA skin on pt's back/ unsure of date   Normal cardiac stress test    remote past per patient, [place], [place]   Obesity    OSA on CPAP    Recurrent UTI    Dr. Brunilda Payor, [facility]   Sleep apnea    uses c-pap   Tinea pedis    Tobacco use    4 cigarrettes daily   Wears glasses     ALLERGIES:  has no known allergies.  MEDICATIONS:  Current Outpatient Medications  Medication Sig Dispense Refill   acetaminophen (TYLENOL) 325 MG tablet Take 650 mg by mouth every 6 (six) hours as needed.     ASPIRIN LOW DOSE 81 MG EC tablet TAKE 1 TABLET BY MOUTH EVERY DAY 90 tablet 3   Cyanocobalamin (TH VITAMIN B12 PO) Take by mouth.     Glycerin-Hypromellose-PEG 400 (VISINE DRY EYE) 0.2-0.2-1 % SOLN Place 1-2 drops into both eyes 3 (three) times daily as needed (dry/irritated eyes).     hydrocortisone (PROCTOSOL HC) 2.5 % rectal cream Place 1 application rectally 2 (two) times daily. (Patient taking differently: Place 1 application  rectally 2 (two) times daily as needed for  hemorrhoids.) 28.35 g 0   meloxicam (MOBIC) 7.5 MG tablet TAKE 1 TABLET BY MOUTH EVERY DAY AS NEEDED FOR PAIN 30 tablet 0   NON FORMULARY C PAP     Omega-3 Fatty Acids (FISH OIL PO) Take by mouth.     pramoxine-hydrocortisone (ANALPRAM HC) cream Apply topically 3 (three) times daily. 30 g 1   sildenafil (REVATIO) 20 MG tablet TAKE 1 TO 5 TABLETS BY MOUTH DAILY AS NEEDED PRIOR TO SEXUAL ACTIVITY 50 tablet 2   tiZANidine (ZANAFLEX) 2 MG tablet TAKE 1 TO 2 TABLETS BY MOUTH AT BEDTIME AS NEEDED FOR MUSCLE SPASMS 180 tablet 1   traMADol (ULTRAM) 50 MG tablet Take 50 mg by mouth every 6 (six) hours as needed (pain).     VITAMIN E PO Take by mouth.     No current facility-administered medications for this visit.    SURGICAL HISTORY:  Past  Surgical History:  Procedure Laterality Date   ADENOIDECTOMY     BIOPSY  01/07/2021   Procedure: BIOPSY;  Surgeon: Beverley Fiedler, MD;  Location: [facility];  Service: Gastroenterology;;   CARDIAC CATHETERIZATION  02/11/2005   COLONOSCOPY  02/2012   Dr. Erick Blinks, repeat 02/2017   COLONOSCOPY WITH PROPOFOL N/A 01/07/2021   Procedure: COLONOSCOPY WITH PROPOFOL;  Surgeon: Beverley Fiedler, MD;  Location: [facility];  Service: Gastroenterology;  Laterality: N/A;   ESOPHAGEAL DILATION     INTERCOSTAL NERVE BLOCK Right 02/28/2019   Procedure: Intercostal Nerve Block;  Surgeon: Corliss Skains, MD;  Location: [facility];  Service: Thoracic;  Laterality: Right;   NODE DISSECTION Right 02/28/2019   Procedure: Node Dissection;  Surgeon: Corliss Skains, MD;  Location: [facility];  Service: Thoracic;  Laterality: Right;   POLYPECTOMY  01/07/2021   Procedure: POLYPECTOMY;  Surgeon: Beverley Fiedler, MD;  Location: [facility];  Service: Gastroenterology;;   ROTATOR CUFF REPAIR  1997   left side   SKIN SURGERY     large area of MRSA on back/ over 10 years ago   TONSILLECTOMY     UPPER GASTROINTESTINAL ENDOSCOPY     VIDEO BRONCHOSCOPY N/A 02/28/2019   Procedure: VIDEO BRONCHOSCOPY;  Surgeon: Corliss Skains, MD;  Location: [facility];  Service: Thoracic;  Laterality: N/A;    REVIEW OF SYSTEMS:  A comprehensive review of systems was negative except for: Respiratory: positive for dyspnea on exertion Musculoskeletal: positive for back pain   PHYSICAL EXAMINATION: General appearance: alert, cooperative, fatigued, and no distress Head: Normocephalic, without obvious abnormality, atraumatic Neck: no adenopathy, no JVD, supple, symmetrical, trachea midline, and thyroid not enlarged, symmetric, no tenderness/mass/nodules Lymph nodes: Cervical, supraclavicular, and axillary nodes normal. Resp: clear to auscultation bilaterally Back: symmetric, no curvature. ROM normal. No CVA tenderness. Cardio: regular rate  and rhythm, S1, S2 normal, no murmur, click, rub or gallop GI: soft, non-tender; bowel sounds normal; no masses,  no organomegaly Extremities: extremities normal, atraumatic, no cyanosis or edema  ECOG PERFORMANCE STATUS: 1 - Symptomatic but completely ambulatory  Blood pressure (!) 146/61, pulse 85, temperature 98 F (36.7 C), temperature source Temporal, resp. rate 17, height 6\' 1"  (1.854 m), weight (!) 308 lb (139.7 kg), SpO2 98%.  LABORATORY DATA: Lab Results  Component Value Date   WBC 9.5 04/03/2023   HGB 13.4 04/03/2023   HCT 39.2 04/03/2023   MCV 89.5 04/03/2023   PLT 233 04/03/2023      Chemistry      Component Value Date/Time   NA 140 04/03/2023  1512   K 4.4 04/03/2023 1512   CL 106 04/03/2023 1512   CO2 27 04/03/2023 1512   BUN 12 04/03/2023 1512   CREATININE 0.94 04/03/2023 1512   CREATININE 1.15 05/11/2015 0001      Component Value Date/Time   CALCIUM 8.7 (L) 04/03/2023 1512   ALKPHOS 67 04/03/2023 1512   AST 28 04/03/2023 1512   ALT 36 04/03/2023 1512   BILITOT 0.6 04/03/2023 1512       RADIOGRAPHIC STUDIES: No results found.  ASSESSMENT AND PLAN: This is a very pleasant 63 years old African-American male with stage Ia non-small cell lung cancer, adenocarcinoma status post right lower lobectomy with lymph node sampling under the care of Dr. Cliffton Asters in February 2021.  The patient is currently on observation and he is feeling fine with no concerning complaints. He had repeat CT scan of the chest performed 2 days ago but the final report is still pending.  I personally independently reviewed the images and I do not see any clear finding for disease recurrence or metastasis but I will wait for the final report for confirmation.    Stage 1A non-small cell lung cancer adenocarcinoma Diagnosed in February 2021. Underwent right lower lobectomy with lymph node sampling. Annual surveillance scans have shown well-managed findings. Recent scan reviewed appears  unchanged from the previous year, pending radiologist confirmation. - Continue annual surveillance scans - Follow up in one year if scan results are stable - Advise to call if any issues arise  Intermittent chest pain Reports occasional chest pain without acute distress.  Shortness of breath Experiencing shortness of breath, likely related to deconditioning. Encouraged to resume exercise to improve fitness and control hypertension. - Encourage regular exercise to improve fitness and control hypertension  Low back pain Reports occasional low back pain.   The patient was advised to call immediately if he has any concerning symptoms in the interval. The patient voices understanding of current disease status and treatment options and is in agreement with the current care plan.  All questions were answered. The patient knows to call the clinic with any problems, questions or concerns. We can certainly see the patient much sooner if necessary.  The total time spent in the appointment was 20 minutes.  Disclaimer: This note was dictated with voice recognition software. Similar sounding words can inadvertently be transcribed and may not be corrected upon review.

## 2023-04-14 ENCOUNTER — Telehealth: Payer: Self-pay | Admitting: Medical Oncology

## 2023-04-14 ENCOUNTER — Telehealth: Payer: Self-pay

## 2023-04-14 NOTE — Telephone Encounter (Signed)
 Pt left message to call him back. No other information given.  I called back pt and lvm that I returned his call.

## 2023-04-14 NOTE — Telephone Encounter (Signed)
 Spoke with patient in regards to CT scan results.  Per Cassie, PA- Scan is okay and follow up with scan in 1 year. Informed patient to call with any concerns or questions. Patient verbalized understanding.

## 2023-04-14 NOTE — Telephone Encounter (Signed)
 He asked for final reading of CT Scan by radiology .

## 2023-04-24 ENCOUNTER — Telehealth: Payer: Self-pay | Admitting: Medical Oncology

## 2023-04-24 NOTE — Telephone Encounter (Signed)
 Pt notified that it is "Ok to tell him it looked fine and that we will see him next year with another scan as planned ".

## 2023-04-28 NOTE — Progress Notes (Signed)
 [address]       [place]endo[place] [address]             [phone]    I connected with  Roger Mendoza. on 05/11/23 by telephone and verified that I am speaking with the correct person using two identifiers.   I discussed the limitations of evaluation and management by telemedicine. The patient expressed understanding and agreed to proceed.  HPI: [name] is a 63 year old male with a past medical history of OSA, morbid obesity, and stage Ia non small cell lung cancer. He underwent robotic assisted right lower lobectomy by Dr. Deloise Ferries on 02/28/19. He has undergone annual surveillance scans since then and there has been no evidence of recurrent or metastatic disease since then. He saw oncology on 04/05/23 for further surveillance and CT scan was stable at that visit.  Today he reports SOB at times but feels he is just out of shape, he denies chest pain and fatigue. He does report he developed right neck tingling that began a few days ago, this does not radiate. He does have a pinched nerve in his back and feels this may be contributing. He also reports his weight is fluctuant but he has not had significant weight loss.  Current Outpatient Medications  Medication Sig Dispense Refill   acetaminophen (TYLENOL) 325 MG tablet Take 650 mg by mouth every 6 (six) hours as needed.     ASPIRIN LOW DOSE 81 MG EC tablet TAKE 1 TABLET BY MOUTH EVERY DAY 90 tablet 3   Cyanocobalamin (TH VITAMIN B12 PO) Take by mouth.     Glycerin-Hypromellose-PEG 400 (VISINE DRY EYE) 0.2-0.2-1 % SOLN Place 1-2 drops into both eyes 3 (three) times daily as needed (dry/irritated eyes).     hydrocortisone (PROCTOSOL HC) 2.5 % rectal cream Place 1 application rectally 2 (two) times daily. (Patient taking differently: Place 1 application  rectally 2 (two) times daily as needed for hemorrhoids.) 28.35 g 0   meloxicam (MOBIC) 7.5 MG tablet TAKE 1 TABLET BY MOUTH EVERY DAY AS NEEDED FOR PAIN 30  tablet 0   NON FORMULARY C PAP     Omega-3 Fatty Acids (FISH OIL PO) Take by mouth.     pramoxine-hydrocortisone (ANALPRAM HC) cream Apply topically 3 (three) times daily. 30 g 1   sildenafil (REVATIO) 20 MG tablet TAKE 1 TO 5 TABLETS BY MOUTH DAILY AS NEEDED PRIOR TO SEXUAL ACTIVITY 50 tablet 2   tiZANidine (ZANAFLEX) 2 MG tablet TAKE 1 TO 2 TABLETS BY MOUTH AT BEDTIME AS NEEDED FOR MUSCLE SPASMS 180 tablet 1   traMADol (ULTRAM) 50 MG tablet Take 50 mg by mouth every 6 (six) hours as needed (pain).     VITAMIN E PO Take by mouth.     No current facility-administered medications for this visit.   Diagnostic Tests: CLINICAL DATA:  Assess treatment response non-small-cell lung cancer. * Tracking Code: BO *   EXAM: CT CHEST WITH CONTRAST   TECHNIQUE: Multidetector CT imaging of the chest was performed during intravenous contrast administration.   RADIATION DOSE REDUCTION: This exam was performed according to the departmental dose-optimization program which includes automated exposure control, adjustment of the mA and/or kV according to patient size and/or use of iterative reconstruction technique.   CONTRAST:  80mL OMNIPAQUE IOHEXOL 300 MG/ML  SOLN   COMPARISON:  CT 03/03/2022 and older.   FINDINGS: Cardiovascular: Bovine type aortic arch, normal variant. Thoracic aorta overall  has a normal course and caliber with mild calcified plaque. Coronary artery calcifications are seen. Heart is nonenlarged. No pericardial effusion.   Mediastinum/Nodes: Slightly patulous thoracic esophagus. Preserved thyroid gland. No specific abnormal lymph node enlargement identified in the axillary regions, hilum or mediastinum.   Lungs/Pleura: Stable bandlike surgical changes from prior lower lobe resection with some scarring and pleural thickening. No developing new mass lesion, fluid collection or lymph node enlargement.   Upper Abdomen: The adrenal glands are preserved in the upper abdomen.  Nodular contour to liver. Please correlate for any history of chronic liver disease.   Musculoskeletal: Mild degenerative changes seen along the spine.   IMPRESSION: Stable postsurgical changes. No developing new mass lesion, fluid collection or nodal enlargement.   Slightly nodular contour to the liver. Please correlate for any known history.   Aortic Atherosclerosis (ICD10-I70.0).     Electronically Signed   By: Adrianna Horde M.D.   On: 04/05/2023 12:32  Impression/Plan: S/P right lower lobectomy in 2021: Annual CT scans have remained stable with no sign of recurrence or metastasis. He has started annual follow up and surveillance with oncology and will continue to be followed up by them yearly. Plan to have the patient return to the clinic as needed.   Back pain: Patient reports he has a pinched nerve in his back and feels this may be causing some of his neck tingling. Told the patient to follow up with his PCP if this does not improve.   Randa Burton, PA-C [facility] [phone]

## 2023-05-05 ENCOUNTER — Telehealth: Admitting: Thoracic Surgery (Cardiothoracic Vascular Surgery)

## 2023-05-11 ENCOUNTER — Ambulatory Visit (INDEPENDENT_AMBULATORY_CARE_PROVIDER_SITE_OTHER): Admitting: Physician Assistant

## 2023-05-11 DIAGNOSIS — Z08 Encounter for follow-up examination after completed treatment for malignant neoplasm: Secondary | ICD-10-CM | POA: Diagnosis not present

## 2023-05-11 DIAGNOSIS — Z902 Acquired absence of lung [part of]: Secondary | ICD-10-CM | POA: Diagnosis not present

## 2023-05-11 NOTE — Patient Instructions (Signed)
 Continue annual CT scans with follow up at the oncology clinic  Please call us  regarding any questions you may have, you may return to the clinic as needed

## 2023-08-09 ENCOUNTER — Emergency Department (HOSPITAL_BASED_OUTPATIENT_CLINIC_OR_DEPARTMENT_OTHER)

## 2023-08-09 ENCOUNTER — Inpatient Hospital Stay (HOSPITAL_BASED_OUTPATIENT_CLINIC_OR_DEPARTMENT_OTHER)
Admission: EM | Admit: 2023-08-09 | Discharge: 2023-08-11 | DRG: 872 | Disposition: A | Discharge status: Home / Self Care | Attending: Internal Medicine | Admitting: Internal Medicine

## 2023-08-09 ENCOUNTER — Encounter (HOSPITAL_BASED_OUTPATIENT_CLINIC_OR_DEPARTMENT_OTHER): Payer: Self-pay | Admitting: Emergency Medicine

## 2023-08-09 ENCOUNTER — Other Ambulatory Visit: Payer: Self-pay

## 2023-08-09 DIAGNOSIS — R030 Elevated blood-pressure reading, without diagnosis of hypertension: Secondary | ICD-10-CM | POA: Diagnosis present

## 2023-08-09 DIAGNOSIS — Z1152 Encounter for screening for COVID-19: Secondary | ICD-10-CM | POA: Diagnosis not present

## 2023-08-09 DIAGNOSIS — Z8614 Personal history of Methicillin resistant Staphylococcus aureus infection: Secondary | ICD-10-CM | POA: Diagnosis not present

## 2023-08-09 DIAGNOSIS — A419 Sepsis, unspecified organism: Principal | ICD-10-CM | POA: Diagnosis present

## 2023-08-09 DIAGNOSIS — E876 Hypokalemia: Secondary | ICD-10-CM | POA: Diagnosis present

## 2023-08-09 DIAGNOSIS — N452 Orchitis: Secondary | ICD-10-CM | POA: Diagnosis present

## 2023-08-09 DIAGNOSIS — N433 Hydrocele, unspecified: Secondary | ICD-10-CM | POA: Diagnosis present

## 2023-08-09 DIAGNOSIS — Z830 Family history of human immunodeficiency virus [HIV] disease: Secondary | ICD-10-CM

## 2023-08-09 DIAGNOSIS — E785 Hyperlipidemia, unspecified: Secondary | ICD-10-CM | POA: Diagnosis present

## 2023-08-09 DIAGNOSIS — Z83438 Family history of other disorder of lipoprotein metabolism and other lipidemia: Secondary | ICD-10-CM

## 2023-08-09 DIAGNOSIS — Z902 Acquired absence of lung [part of]: Secondary | ICD-10-CM | POA: Diagnosis not present

## 2023-08-09 DIAGNOSIS — Z87891 Personal history of nicotine dependence: Secondary | ICD-10-CM

## 2023-08-09 DIAGNOSIS — N5089 Other specified disorders of the male genital organs: Principal | ICD-10-CM

## 2023-08-09 DIAGNOSIS — Z7982 Long term (current) use of aspirin: Secondary | ICD-10-CM

## 2023-08-09 DIAGNOSIS — G4733 Obstructive sleep apnea (adult) (pediatric): Secondary | ICD-10-CM | POA: Diagnosis present

## 2023-08-09 DIAGNOSIS — Z860101 Personal history of adenomatous and serrated colon polyps: Secondary | ICD-10-CM | POA: Diagnosis not present

## 2023-08-09 DIAGNOSIS — Z85118 Personal history of other malignant neoplasm of bronchus and lung: Secondary | ICD-10-CM

## 2023-08-09 DIAGNOSIS — Z79899 Other long term (current) drug therapy: Secondary | ICD-10-CM | POA: Diagnosis not present

## 2023-08-09 DIAGNOSIS — N453 Epididymo-orchitis: Secondary | ICD-10-CM | POA: Diagnosis present

## 2023-08-09 DIAGNOSIS — R6520 Severe sepsis without septic shock: Secondary | ICD-10-CM | POA: Diagnosis present

## 2023-08-09 DIAGNOSIS — K760 Fatty (change of) liver, not elsewhere classified: Secondary | ICD-10-CM | POA: Diagnosis present

## 2023-08-09 DIAGNOSIS — Z6841 Body Mass Index (BMI) 40.0 and over, adult: Secondary | ICD-10-CM

## 2023-08-09 DIAGNOSIS — Z8261 Family history of arthritis: Secondary | ICD-10-CM

## 2023-08-09 DIAGNOSIS — R509 Fever, unspecified: Secondary | ICD-10-CM

## 2023-08-09 DIAGNOSIS — Z8249 Family history of ischemic heart disease and other diseases of the circulatory system: Secondary | ICD-10-CM | POA: Diagnosis not present

## 2023-08-09 LAB — COMPREHENSIVE METABOLIC PANEL WITH GFR
ALT: 30 U/L (ref 0–44)
AST: 29 U/L (ref 15–41)
Albumin: 4.7 g/dL (ref 3.5–5.0)
Alkaline Phosphatase: 78 U/L (ref 38–126)
Anion gap: 15 (ref 5–15)
BUN: 10 mg/dL (ref 8–23)
CO2: 21 mmol/L — ABNORMAL LOW (ref 22–32)
Calcium: 9.6 mg/dL (ref 8.9–10.3)
Chloride: 102 mmol/L (ref 98–111)
Creatinine, Ser: 1.02 mg/dL (ref 0.61–1.24)
GFR, Estimated: >60 mL/min (ref >60)
Glucose, Bld: 113 mg/dL — ABNORMAL HIGH (ref 70–99)
Potassium: 3.5 mmol/L (ref 3.5–5.1)
Sodium: 137 mmol/L (ref 135–145)
Total Bilirubin: 0.8 mg/dL (ref 0.0–1.2)
Total Protein: 7.7 g/dL (ref 6.5–8.1)

## 2023-08-09 LAB — CBC WITH DIFFERENTIAL/PLATELET
Abs Immature Granulocytes: 0.22 K/uL — ABNORMAL HIGH (ref 0.00–0.07)
Basophils Absolute: 0 K/uL (ref 0.0–0.1)
Basophils Relative: 0 %
Eosinophils Absolute: 0 K/uL (ref 0.0–0.5)
Eosinophils Relative: 0 %
HCT: 36.5 % — ABNORMAL LOW (ref 39.0–52.0)
Hemoglobin: 12.5 g/dL — ABNORMAL LOW (ref 13.0–17.0)
Immature Granulocytes: 1 %
Lymphocytes Relative: 6 %
Lymphs Abs: 1 K/uL (ref 0.7–4.0)
MCH: 30.9 pg (ref 26.0–34.0)
MCHC: 34.2 g/dL (ref 30.0–36.0)
MCV: 90.1 fL (ref 80.0–100.0)
Monocytes Absolute: 1.3 K/uL — ABNORMAL HIGH (ref 0.1–1.0)
Monocytes Relative: 7 %
Neutro Abs: 15.8 K/uL — ABNORMAL HIGH (ref 1.7–7.7)
Neutrophils Relative %: 86 %
Platelets: 220 K/uL (ref 150–400)
RBC: 4.05 MIL/uL — ABNORMAL LOW (ref 4.22–5.81)
RDW: 12.3 % (ref 11.5–15.5)
WBC: 18.4 K/uL — ABNORMAL HIGH (ref 4.0–10.5)
nRBC: 0 % (ref 0.0–0.2)

## 2023-08-09 LAB — URINALYSIS, W/ REFLEX TO CULTURE (INFECTION SUSPECTED)
Bacteria, UA: NONE SEEN
Bilirubin Urine: NEGATIVE
Glucose, UA: NEGATIVE mg/dL
Hgb urine dipstick: NEGATIVE
Ketones, ur: NEGATIVE mg/dL
Leukocytes,Ua: NEGATIVE
Nitrite: NEGATIVE
Protein, ur: NEGATIVE mg/dL
Specific Gravity, Urine: 1.013 (ref 1.005–1.030)
pH: 5 (ref 5.0–8.0)

## 2023-08-09 LAB — RESP PANEL BY RT-PCR (RSV, FLU A&B, COVID)  RVPGX2
Influenza A by PCR: NEGATIVE
Influenza B by PCR: NEGATIVE
Resp Syncytial Virus by PCR: NEGATIVE
SARS Coronavirus 2 by RT PCR: NEGATIVE

## 2023-08-09 LAB — PROTIME-INR
INR: 1.1 (ref 0.8–1.2)
Prothrombin Time: 14.6 s (ref 11.4–15.2)

## 2023-08-09 LAB — LACTIC ACID, PLASMA
Lactic Acid, Venous: 2 mmol/L (ref 0.5–1.9)
Lactic Acid, Venous: 2.3 mmol/L (ref 0.5–1.9)

## 2023-08-09 MED ORDER — IOHEXOL 300 MG/ML  SOLN
100.0000 mL | Freq: Once | INTRAMUSCULAR | Status: AC | PRN
  Administered 2023-08-09: 100 mL via INTRAVENOUS

## 2023-08-09 MED ORDER — ACETAMINOPHEN 500 MG PO TABS
1000.0000 mg | ORAL_TABLET | Freq: Once | ORAL | Status: AC
Start: 2023-08-09 — End: 2023-08-09
  Administered 2023-08-09: 1000 mg via ORAL
  Filled 2023-08-09: qty 2

## 2023-08-09 MED ORDER — LACTATED RINGERS IV BOLUS (SEPSIS)
1000.0000 mL | Freq: Once | INTRAVENOUS | Status: AC
  Administered 2023-08-09: 1000 mL via INTRAVENOUS

## 2023-08-09 MED ORDER — ACETAMINOPHEN 325 MG PO TABS: 650.0000 mg | ORAL_TABLET | Freq: Once | ORAL | Status: DC | PRN

## 2023-08-09 MED ORDER — LACTATED RINGERS IV SOLN: INTRAVENOUS | Status: DC

## 2023-08-09 MED ORDER — IBUPROFEN 800 MG PO TABS
800.0000 mg | ORAL_TABLET | Freq: Once | ORAL | Status: AC
  Administered 2023-08-09: 800 mg via ORAL
  Filled 2023-08-09: qty 1

## 2023-08-09 MED ORDER — PIPERACILLIN-TAZOBACTAM 3.375 G IVPB 30 MIN
3.3750 g | Freq: Once | INTRAVENOUS | Status: AC
  Administered 2023-08-09: 3.375 g via INTRAVENOUS
  Filled 2023-08-09: qty 50

## 2023-08-09 NOTE — ED Notes (Signed)
 EKG, UA, more fluids

## 2023-08-09 NOTE — ED Triage Notes (Addendum)
 Earlier today right arm pain with cold feeling fingers-resolved.   Headaches starting around 1600- still persists.  Right flank flank pain into front-intermittent. Same side of previous procedure on lung- possible partial lobectomy?-several years ago.  Pain and swelling in groin as well- started this AM- right testicle.  Denies N/V/D, urinary symptoms.

## 2023-08-09 NOTE — Plan of Care (Addendum)
 [facility] transfer:  63 year old man past medical history of obstructive sleep apnea, morbid obesity, small cell lung cancer status post lobectomy 2021 and former smoker present emergency department complaining of right-sided arm pain, feeling cold in the finger which has been resolved, headache, right-sided flank pain as well as pain and swelling of the groin and right-sided testicle.  At presentation to ED patient found febrile, tachycardic and blood pressure is borderline soft. Elevated lactic acid 2.  Respiratory panel negative.  CMP unremarkable except low bicarb 21.  CBC showing leukocytosis 18, stable H&H and normal platelet count.  Blood cultures are in process.  Chest x-ray no active disease process.  CT abdomen pelvis showing fatty liver otherwise unremarkable.  Pending ultrasound of the scrotum.  In the ED patient has been given 1 L of LR bolus currently on LR infusion.  Also being received 1 dose of Zosyn .  ED provider reported that right-sided testicular looks very swollen tender to touch.  Concern for possible orchitis.  However given scrotal ultrasound is pending and not sure about testicular torsion requested ED provider to consult urology and get urology input first.    Update, ED physician consulted urology Dr McDiarmid advised to admit to [facility] continue IV antibiotics and that urology would see him in the morning. He is less concerned about torsion but stated he would be available if ultrasound was concerning.  Requesting to admit patient to [facility] for management of sepsis in the setting of orchitis.

## 2023-08-09 NOTE — Progress Notes (Signed)
 Elink is following code sepsis.

## 2023-08-09 NOTE — ED Notes (Signed)
 [place] reported to provider.

## 2023-08-09 NOTE — ED Provider Notes (Signed)
 [place] EMERGENCY DEPARTMENT AT [facility] Provider Note   CSN: [id number] Arrival date & time: 08/09/23  1728     Patient presents with: Fever and Groin Swelling  HPI Roger Mendoza. is a 63 y.o. male with history of recurrent UTI, epididymitis, hyperlipidemia presenting for scrotal swelling and fever.  He states he noticed the scrotum swelling about 5 AM this morning when he woke up.  Also endorses a fever at home as well.  He also thinks that there may be a mass on his right testicle.  He also mentioned that he has had intermittent right flank and abdominal pain.  He denies any trauma that he knows of to his scrotum.  Denies urinary symptoms, nausea vomiting diarrhea.  He also endorses a headache that started around 4 PM this afternoon.  Denies visual disturbance or nuchal rigidity.  Also states he has a cold feeling in his fingers.    Fever      Prior to Admission medications   Medication Sig Start Date End Date Taking? Authorizing Provider  acetaminophen  (TYLENOL ) 325 MG tablet Take 650 mg by mouth every 6 (six) hours as needed.    [provider]  ASPIRIN  LOW DOSE 81 MG EC tablet TAKE 1 TABLET BY MOUTH EVERY DAY 04/12/21   Mercer Clotilda SAUNDERS, MD  Cyanocobalamin  [facility] VITAMIN B12 PO) Take by mouth.    [provider]  Glycerin-Hypromellose-PEG 400 (VISINE DRY EYE) 0.2-0.2-1 % SOLN Place 1-2 drops into both eyes 3 (three) times daily as needed (dry/irritated eyes).    [provider]  hydrocortisone  (PROCTOSOL HC) 2.5 % rectal cream Place 1 application rectally 2 (two) times daily. Patient taking differently: Place 1 application  rectally 2 (two) times daily as needed for hemorrhoids. 02/12/18   Mercer Clotilda SAUNDERS, MD  meloxicam  (MOBIC ) 7.5 MG tablet TAKE 1 TABLET BY MOUTH EVERY DAY AS NEEDED FOR PAIN 05/04/21   Joane Artist RAMAN, MD  NON FORMULARY C PAP    [provider]  Omega-3 Fatty Acids (FISH OIL PO) Take by mouth.    [provider]  pramoxine-hydrocortisone  (ANALPRAM HC) cream Apply topically 3 (three) times daily. 09/17/21   Mercer Clotilda SAUNDERS, MD  sildenafil  (REVATIO ) 20 MG tablet TAKE 1 TO 5 TABLETS BY MOUTH DAILY AS NEEDED PRIOR TO SEXUAL ACTIVITY 09/17/21   Mercer Clotilda SAUNDERS, MD  tiZANidine  (ZANAFLEX ) 2 MG tablet TAKE 1 TO 2 TABLETS BY MOUTH AT BEDTIME AS NEEDED FOR MUSCLE SPASMS 02/04/22   Mercer Clotilda SAUNDERS, MD  traMADol  (ULTRAM ) 50 MG tablet Take 50 mg by mouth every 6 (six) hours as needed (pain). 12/09/20   [provider]  VITAMIN E PO Take by mouth.    [provider]    Allergies: Patient has no known allergies.    Review of Systems  Constitutional:  Positive for fever.    Updated Vital Signs BP (!) 120/54   Pulse (!) 104   Temp 99.1 F (37.3 C)   Resp 16   Wt (!) 138.3 kg   SpO2 94%   BMI 40.24 kg/m   Physical Exam Vitals and nursing note reviewed. Exam conducted with a chaperone present.  HENT:     Head: Normocephalic and atraumatic.     Mouth/Throat:     Mouth: Mucous membranes are moist.  Eyes:     General:        Right eye: No discharge.        Left eye: No  discharge.     Conjunctiva/sclera: Conjunctivae normal.  Cardiovascular:     Rate and Rhythm: Normal rate and regular rhythm.     Pulses: Normal pulses.     Heart sounds: Normal heart sounds.  Pulmonary:     Effort: Pulmonary effort is normal.     Breath sounds: Normal breath sounds.  Abdominal:     General: Abdomen is flat.     Palpations: Abdomen is soft.     Tenderness: There is no abdominal tenderness.  Genitourinary:    Penis: Normal and uncircumcised.      Testes:        Right: Mass, tenderness and swelling present.        Left: Tenderness present.     Epididymis:     Right: Tenderness present.  Skin:    General: Skin is warm and dry.  Neurological:     General: No focal deficit present.  Psychiatric:        Mood and Affect: Mood normal.     (all labs ordered are listed, but only  abnormal results are displayed) Labs Reviewed  LACTIC ACID, PLASMA - Abnormal; Notable for the following components:      Result Value   Lactic Acid, Venous 2.0 (*)    All other components within normal limits  COMPREHENSIVE METABOLIC PANEL WITH GFR - Abnormal; Notable for the following components:   CO2 21 (*)    Glucose, Bld 113 (*)    All other components within normal limits  CBC WITH DIFFERENTIAL/PLATELET - Abnormal; Notable for the following components:   WBC 18.4 (*)    RBC 4.05 (*)    Hemoglobin 12.5 (*)    HCT 36.5 (*)    Neutro Abs 15.8 (*)    Monocytes Absolute 1.3 (*)    Abs Immature Granulocytes 0.22 (*)    All other components within normal limits  RESP PANEL BY RT-PCR (RSV, FLU A&B, COVID)  RVPGX2  CULTURE, BLOOD (ROUTINE X 2)  CULTURE, BLOOD (ROUTINE X 2)  PROTIME-INR  LACTIC ACID, PLASMA  URINALYSIS, W/ REFLEX TO CULTURE (INFECTION SUSPECTED)    EKG: EKG Interpretation Date/Time:  Wednesday August 09 2023 20:08:11 EDT Ventricular Rate:  106 PR Interval:  163 QRS Duration:  80 QT Interval:  327 QTC Calculation: 435 R Axis:   57  Text Interpretation: Sinus tachycardia Abnormal R-wave progression, early transition Probable LVH with secondary repol abnrm ST depression, consider ischemia, diffuse lds No significant change since last tracing Confirmed by Emil Share [phone]) on 08/09/2023 8:09:56 PM  Radiology: CT ABDOMEN PELVIS W CONTRAST Result Date: 08/09/2023 CLINICAL DATA:  Right-sided flank pain and possible sepsis, initial encounter EXAM: CT ABDOMEN AND PELVIS WITH CONTRAST TECHNIQUE: Multidetector CT imaging of the abdomen and pelvis was performed using the standard protocol following bolus administration of intravenous contrast. RADIATION DOSE REDUCTION: This exam was performed according to the departmental dose-optimization program which includes automated exposure control, adjustment of the mA and/or kV according to patient size and/or use of iterative  reconstruction technique. CONTRAST:  100mL OMNIPAQUE  IOHEXOL  300 MG/ML  SOLN COMPARISON:  None Available. FINDINGS: Lower chest: No acute abnormality. Hepatobiliary: Fatty infiltration of the liver is noted. The gallbladder is within normal limits. Pancreas: Unremarkable. No pancreatic ductal dilatation or surrounding inflammatory changes. Spleen: Normal in size without focal abnormality. Adrenals/Urinary Tract: Adrenal glands are within normal limits. Kidneys demonstrate a normal enhancement pattern and excretion bilaterally. No renal calculi or obstructive changes are seen. The bladder is decompressed. Stomach/Bowel: No  obstructive or inflammatory changes of the colon are noted. The appendix is within normal limits. Small bowel and stomach are unremarkable. Vascular/Lymphatic: No significant vascular findings are present. No enlarged abdominal or pelvic lymph nodes. Reproductive: Prostate is unremarkable. Other: No abdominal wall hernia or abnormality. No abdominopelvic ascites. Musculoskeletal: No acute or significant osseous findings. IMPRESSION: Fatty liver. No other focal abnormality is noted. Electronically Signed   By: Oneil Devonshire M.D.   On: 08/09/2023 20:14   DG Chest Port 1 View Result Date: 08/09/2023 CLINICAL DATA:  Questionable sepsis EXAM: PORTABLE CHEST 1 VIEW COMPARISON:  X-ray 04/05/2019.  CT scan 04/03/2023 FINDINGS: Under penetrated radiograph. No consolidation, pneumothorax or effusion. Normal cardiopericardial silhouette without edema. Previous right lower lobe lung resection. IMPRESSION: No acute cardiopulmonary disease. Electronically Signed   By: Ranell Bring M.D.   On: 08/09/2023 18:40     .Critical Care  Performed by: Lang Norleen POUR, PA-C Authorized by: Lang Norleen POUR, PA-C   Critical care provider statement:    Critical care time (minutes):  30   Critical care was necessary to treat or prevent imminent or life-threatening deterioration of the following conditions:   Sepsis   Critical care was time spent personally by me on the following activities:  Development of treatment plan with patient or surrogate, discussions with consultants, evaluation of patient's response to treatment, examination of patient, ordering and review of laboratory studies, ordering and review of radiographic studies, ordering and performing treatments and interventions, pulse oximetry, re-evaluation of patient's condition and review of old charts    Medications Ordered in the ED  lactated ringers  infusion (has no administration in time range)  lactated ringers  bolus 1,000 mL (1,000 mLs Intravenous New Bag/Given 08/09/23 2028)  piperacillin -tazobactam (ZOSYN ) IVPB 3.375 g (0 g Intravenous Stopped 08/09/23 1907)  lactated ringers  bolus 1,000 mL (0 mLs Intravenous Stopped 08/09/23 2007)  acetaminophen  (TYLENOL ) tablet 1,000 mg (1,000 mg Oral Given 08/09/23 1842)  ibuprofen  (ADVIL ) tablet 800 mg (800 mg Oral Given 08/09/23 1939)  iohexol  (OMNIPAQUE ) 300 MG/ML solution 100 mL (100 mLs Intravenous Contrast Given 08/09/23 1951)                                    Medical Decision Making Amount and/or Complexity of Data Reviewed Labs: ordered. Radiology: ordered.  Risk OTC drugs. Prescription drug management.   Initial Impression and Ddx 63 year old well-appearing male presenting for scrotal swelling and fever.  Exam notable for scrotal swelling and erythema and right testicular swelling with a mass, fever and tachycardia.  Activated code sepsis and started on Zosyn , IV fluids.  Ddx sepsis, orchitis, appendicitis, kidney stone, testicular torsion, pneumonia, meningitis, other. Patient PMH that increases complexity of ED encounter:  history of recurrent UTI, epididymitis, hyperlipidemia  Interpretation of Diagnostics - I independent reviewed and interpreted the labs as followed: Leukocytosis  - I independently visualized the following imaging with scope of interpretation limited to  determining acute life threatening conditions related to emergency care: CT ab/pelvis, which revealed no acute abnormality.  Patient Reassessment and Ultimate Disposition/Management Workup overall suggestive of sepsis possibly secondary to orchitis with secondary concern of testicular torsion.  On reassessment, heart rate and fever improved.  Although CT was nonacute, clinically appears to possibly have orchitis.  Discussed patient with Dr. McDiarmid of urology.  He states likely infectious process.  Advised admission to [facility], continue IV antibiotics and sepsis management.  He was less  concern for torsion but states that he would be available if there were concern for torsion on ultrasound.  Admitted to hospital service with Dr. Sundil.   Patient management required discussion with the following services or consulting groups:  Hospitalist Service and Urology  Complexity of Problems Addressed Acute complicated illness or Injury  Additional Data Reviewed and Analyzed Further history obtained from: Past medical history and medications listed in the EMR, Prior ED visit notes, and Recent discharge summary  Patient Encounter Risk Assessment Consideration of hospitalization      Final diagnoses:  Testicular swelling  Fever, unspecified fever cause    ED Discharge Orders     None          Lang Norleen POUR, PA-C 08/09/23 2103    Emil Share, DO 08/09/23 2154

## 2023-08-10 DIAGNOSIS — A419 Sepsis, unspecified organism: Secondary | ICD-10-CM | POA: Diagnosis present

## 2023-08-10 LAB — CBC
HCT: 35.7 % — ABNORMAL LOW (ref 39.0–52.0)
Hemoglobin: 11.9 g/dL — ABNORMAL LOW (ref 13.0–17.0)
MCH: 31.3 pg (ref 26.0–34.0)
MCHC: 33.3 g/dL (ref 30.0–36.0)
MCV: 93.9 fL (ref 80.0–100.0)
Platelets: 196 K/uL (ref 150–400)
RBC: 3.8 MIL/uL — ABNORMAL LOW (ref 4.22–5.81)
RDW: 12.4 % (ref 11.5–15.5)
WBC: 18.8 K/uL — ABNORMAL HIGH (ref 4.0–10.5)
nRBC: 0 % (ref 0.0–0.2)

## 2023-08-10 LAB — GC/CHLAMYDIA PROBE AMP (~~LOC~~) NOT AT ARMC
Chlamydia: NEGATIVE
Comment: NEGATIVE
Comment: NORMAL
Neisseria Gonorrhea: NEGATIVE

## 2023-08-10 LAB — HIV ANTIBODY (ROUTINE TESTING W REFLEX): HIV Screen 4th Generation wRfx: NONREACTIVE

## 2023-08-10 LAB — BASIC METABOLIC PANEL WITH GFR
Anion gap: 9 (ref 5–15)
BUN: 14 mg/dL (ref 8–23)
CO2: 22 mmol/L (ref 22–32)
Calcium: 8.6 mg/dL — ABNORMAL LOW (ref 8.9–10.3)
Chloride: 104 mmol/L (ref 98–111)
Creatinine, Ser: 0.88 mg/dL (ref 0.61–1.24)
GFR, Estimated: >60 mL/min (ref >60)
Glucose, Bld: 138 mg/dL — ABNORMAL HIGH (ref 70–99)
Potassium: 3.2 mmol/L — ABNORMAL LOW (ref 3.5–5.1)
Sodium: 135 mmol/L (ref 135–145)

## 2023-08-10 LAB — LACTIC ACID, PLASMA: Lactic Acid, Venous: 1.4 mmol/L (ref 0.5–1.9)

## 2023-08-10 MED ORDER — SENNOSIDES-DOCUSATE SODIUM 8.6-50 MG PO TABS: 1.0000 | ORAL_TABLET | Freq: Every evening | ORAL | Status: DC | PRN

## 2023-08-10 MED ORDER — HYDROCODONE-ACETAMINOPHEN 5-325 MG PO TABS
1.0000 | ORAL_TABLET | ORAL | Status: DC | PRN
  Administered 2023-08-10 (×2): 2 via ORAL
  Administered 2023-08-10: 1 via ORAL
  Administered 2023-08-10 – 2023-08-11 (×3): 2 via ORAL
  Filled 2023-08-10: qty 2
  Filled 2023-08-10: qty 1
  Filled 2023-08-10 (×4): qty 2

## 2023-08-10 MED ORDER — ONDANSETRON HCL 4 MG PO TABS: 4.0000 mg | ORAL_TABLET | Freq: Four times a day (QID) | ORAL | Status: DC | PRN

## 2023-08-10 MED ORDER — KETOROLAC TROMETHAMINE 15 MG/ML IJ SOLN: 15.0000 mg | Freq: Four times a day (QID) | INTRAMUSCULAR | Status: DC | PRN

## 2023-08-10 MED ORDER — MELATONIN 3 MG PO TABS
3.0000 mg | ORAL_TABLET | Freq: Every evening | ORAL | Status: DC | PRN
  Administered 2023-08-10 (×2): 3 mg via ORAL
  Filled 2023-08-10 (×2): qty 1

## 2023-08-10 MED ORDER — ONDANSETRON HCL 4 MG/2ML IJ SOLN: 4.0000 mg | Freq: Four times a day (QID) | INTRAMUSCULAR | Status: DC | PRN

## 2023-08-10 MED ORDER — POTASSIUM CHLORIDE CRYS ER 20 MEQ PO TBCR
40.0000 meq | EXTENDED_RELEASE_TABLET | Freq: Once | ORAL | Status: AC
  Administered 2023-08-10: 40 meq via ORAL
  Filled 2023-08-10: qty 2

## 2023-08-10 MED ORDER — ACETAMINOPHEN 650 MG RE SUPP: 650.0000 mg | Freq: Four times a day (QID) | RECTAL | Status: DC | PRN

## 2023-08-10 MED ORDER — ACETAMINOPHEN 325 MG PO TABS: 650.0000 mg | ORAL_TABLET | Freq: Four times a day (QID) | ORAL | Status: DC | PRN

## 2023-08-10 MED ORDER — SODIUM CHLORIDE 0.9% FLUSH
3.0000 mL | Freq: Two times a day (BID) | INTRAVENOUS | Status: DC
  Administered 2023-08-10 – 2023-08-11 (×2): 3 mL via INTRAVENOUS

## 2023-08-10 MED ORDER — ENOXAPARIN SODIUM 60 MG/0.6ML IJ SOSY
60.0000 mg | PREFILLED_SYRINGE | INTRAMUSCULAR | Status: DC
  Administered 2023-08-10: 60 mg via SUBCUTANEOUS
  Filled 2023-08-10: qty 0.6

## 2023-08-10 MED ORDER — LEVOFLOXACIN IN D5W 750 MG/150ML IV SOLN
750.0000 mg | INTRAVENOUS | Status: DC
  Administered 2023-08-10 – 2023-08-11 (×2): 750 mg via INTRAVENOUS
  Filled 2023-08-10 (×2): qty 150

## 2023-08-10 NOTE — Progress Notes (Signed)
   08/10/23 2105  BiPAP/CPAP/SIPAP  BiPAP/CPAP/SIPAP Pt Type Adult (prefers self placement)  BiPAP/CPAP/SIPAP  (Home Unit)  Mask Type Full face mask (from home)  Dentures removed? Not applicable  FiO2 (%) 21 %  Heater Temperature  (Sterile water provided)  Patient Home Machine Yes  Safety Check Completed by RT for Home Unit Yes, no issues noted  Patient Home Mask Yes  Patient Home Tubing Yes  Auto Titrate Yes  Minimum cmH2O 5 cmH2O  Maximum cmH2O 20 cmH2O  Device Plugged into RED Power Outlet Yes  BiPAP/CPAP /SiPAP Vitals  Pulse Rate 86  Resp 18  SpO2 97 %  Bilateral Breath Sounds Clear;Diminished  MEWS Score/Color  MEWS Score 0  MEWS Score Color Roger Mendoza

## 2023-08-10 NOTE — Progress Notes (Signed)
 PROGRESS NOTE  Roger Mendoza.  DOB: 1960-08-12  PCP: Mercer Clotilda SAUNDERS, MD FMW:[id number]  DOA: 08/09/2023  LOS: 1 day  Hospital Day: 2  Brief narrative: Roger Detter. is a 63 y.o. male with PMH significant for morbid obesity, OSA on CPAP, stage I NSCLC s/p right lower lobectomy 2021, HLD  7/16, patient presented to the ED with complaint of dysuria and right testicular swelling.  Patient states that he has been dealing with burning on urination for quite a while now.  He says he has been on different medications and attempts to treat this.  On the morning of presentation, patient noticed new swelling and pain involving his right testis.  He already had an upcoming appointment with urology on the next day 7/17 but presented to the ED with immediate concern.  In the ED, patient had a fever up to 102.3, tachycardic to 120s, blood pressure in 140s and 150s Labs with WBC count 18.4, hemoglobin 12.5, lactic acid 2, sodium 137, potassium 3.5, BUN/creatinine 10/1.02 Respiratory virus panel unremarkable Urinalysis unremarkable Blood culture sent Chest x-ray unremarkable CT abdomen pelvis showed fatty liver and no other acute abnormalities. EKG showed sinus tachycardia at 106 bpm, QTc 435 ms Scrotal ultrasound showed right hydrocele and changes consistent with right-sided epididymo-orchitis.  EDP spoke with urology (Dr. McDiarmid) who recommended medical admission, IV antibiotics.   Patient was started on IV Zosyn , IV fluid. Admitted to [facility]  Subjective: Patient was seen and examined this morning.  Pleasant middle-age American male.  Walking back from the bathroom.  Wife at bedside. Patient feels better than at presentation.  Able to tolerate oral intake.  Wants heart healthy diet changed to regular diet. Was requesting if telemetry monitoring can be discontinued. Chart reviewed. No fever since 8 PM last night, heart rate in 80s, blood pressure in 150s, breathing room  air Most recent labs from this morning with WC count 18.8, lactic acid normalized, potassium low at 3.8  Assessment and plan: Sepsis secondary to right epididymoorchitis POA patient presented with dysuria, right testicular swelling,  Noted to have fever, tachycardia Scrotal ultrasound showed right-sided epididymoorchitis, no torsion.  UA was negative. Blood culture sent. Started on IV Levaquin . Urology to follow. Analgesics as needed Feeling gradual improvement this morning If no fever spike in the next 24 hours and continues to feel better, plan to discharge home tomorrow on oral Levaquin  for 10 days total Recent Labs  Lab 08/09/23 1810 08/09/23 2010 08/10/23 0607  WBC 18.4*  --  18.8*  LATICACIDVEN 2.0* 2.3* 1.4    Hypokalemia K low at 3.2 this am. Replacement ordered. Recent Labs  Lab 08/09/23 1810 08/10/23 0607  K 3.5 3.2*   Elevated blood pressure BP in 150s.  Denies history of hypertension. Continue to monitor.   OSA Continue CPAP nightly   Mobility: Encourage ambulation.  Goals of care   Code Status: Full Code     DVT prophylaxis: Lovenox  subcu    Antimicrobials: IV Levaquin  Fluid: Can stop IV fluid Consultants: Urology Family Communication: Wife at bedside  Status: Inpatient Level of care: Can discontinue telemetry monitoring and change level of care to Med-Surg   Patient is from: Home Needs to continue in-hospital care: Needs IV antibiotics, fever monitoring Anticipated d/c to: [facility] tomorrow if no fever    Diet:  Diet Order             Diet regular Room service appropriate? Yes; Fluid consistency: Thin  Diet effective now  Scheduled Meds:  enoxaparin  (LOVENOX ) injection  60 mg Subcutaneous Q24H   potassium chloride   40 mEq Oral Once   sodium chloride  flush  3 mL Intravenous Q12H    PRN meds: acetaminophen  **OR** acetaminophen , HYDROcodone -acetaminophen , melatonin, ondansetron  **OR** ondansetron   (ZOFRAN ) IV, senna-docusate   Infusions:   levofloxacin  (LEVAQUIN ) IV 750 mg (08/10/23 0122)    Antimicrobials: Anti-infectives (From admission, onward)    Start     Dose/Rate Route Frequency Ordered Stop   08/10/23 0200  levofloxacin  (LEVAQUIN ) IVPB 750 mg        750 mg 100 mL/hr over 90 Minutes Intravenous Every 24 hours 08/10/23 0022     08/09/23 1815  piperacillin -tazobactam (ZOSYN ) IVPB 3.375 g        3.375 g 100 mL/hr over 30 Minutes Intravenous  Once 08/09/23 1813 08/09/23 1907       Objective: Vitals:   08/10/23 0116 08/10/23 0627  BP: (!) 155/55 (!) 150/68  Pulse: 97 88  Resp: 20 18  Temp: 98.4 F (36.9 C) 98.3 F (36.8 C)  SpO2: 95% 95%    Intake/Output Summary (Last 24 hours) at 08/10/2023 1021 Last data filed at 08/10/2023 0957 Gross per 24 hour  Intake 3314.01 ml  Output 900 ml  Net 2414.01 ml   Filed Weights   08/09/23 1810 08/09/23 2236  Weight: (!) 138.3 kg (!) 141.5 kg   Weight change:  Body mass index is 41.16 kg/m.   Physical Exam: General exam: Pleasant, middle-aged African-American male Skin: No rashes, lesions or ulcers. HEENT: Atraumatic, normocephalic, no obvious bleeding Lungs: Clear to auscultation bilaterally,  CVS: S1, S2, no murmur,   GI/Abd: Soft, nontender, nondistended, bowel sound present,   CNS: Alert, awake, oriented x 3 Psychiatry: Mood appropriate Extremities: No pedal edema, no calf tenderness,   Data Review: I have personally reviewed the laboratory data and studies available.  F/u labs ordered Unresulted Labs (From admission, onward)     Start     Ordered   08/10/23 0500  HIV Antibody (routine testing w rflx)  (HIV Antibody (Routine testing w reflex) panel)  Tomorrow morning,   R        08/10/23 0022            Signed, Chapman Rota, MD [facility] 08/10/2023

## 2023-08-10 NOTE — Hospital Course (Signed)
 Roger Leib. is a 63 y.o. male with medical history significant for OSA, morbid obesity, stage Ia NSCLC s/p right lower lobectomy 2021 who is admitted with sepsis due to right sided epididymoorchitis.

## 2023-08-10 NOTE — Consult Note (Addendum)
 Urology Consult  Referring physician: GORMAN Speaker Reason for referral: Scrotal infection   Chief Complaint: Scrotal infection  History of Present Illness: Patient presented with dysuria and was to see urology but went to the emergency room.  He was having sweats and chills.  He had an elevated white blood count of 18.4.  He was febrile to 101.  He had a CT scrotal ultrasound. Lactic acid 2.3 and now 1.4   Recently seen for dysuria by Urology with negative urine Dysuria last few days with acute right scrotal swelling last night- already feeling better  Minimal LUTS baseline   Scrotal ultrasound in keeping with right sided epididymal orchitis there was increased vascularity to the right testicle  CT scan demonstrated no abnormalities    Past Medical History:  Diagnosis Date   Allergic rhinitis, seasonal    Allergy    seasonal   Cancer (HCC)    lung -  89yrs ago- is not under any kind of treatment   Colon polyp 02/2012   tubular adenoma, Dr. Gordy Albertus Sheerer Quervain's tenosynovitis, right    hx/o   Epididymitis    hx/o as adult   Erectile dysfunction    Family history of premature CAD    father   Heart murmur    Hemorrhoids    hx of   Hyperlipidemia    Left inguinal hernia    tiny as of 03/2014   MRSA (methicillin resistant staph aureus) culture positive    history of MRSA skin on pt's back/ unsure of date   Normal cardiac stress test    remote past per patient, [place], [facility]   Obesity    OSA on CPAP    Recurrent UTI    Dr. Aleene, [facility]   Sleep apnea    uses c-pap   Tinea pedis    Tobacco use    4 cigarrettes daily   Wears glasses    Past Surgical History:  Procedure Laterality Date   ADENOIDECTOMY     BIOPSY  01/07/2021   Procedure: BIOPSY;  Surgeon: Albertus Gordy HERO, MD;  Location: [facility];  Service: Gastroenterology;;   CARDIAC CATHETERIZATION  02/11/2005   COLONOSCOPY  02/2012   Dr. Gordy Albertus, repeat 02/2017   COLONOSCOPY WITH PROPOFOL  N/A  01/07/2021   Procedure: COLONOSCOPY WITH PROPOFOL ;  Surgeon: Albertus Gordy HERO, MD;  Location: [facility];  Service: Gastroenterology;  Laterality: N/A;   ESOPHAGEAL DILATION     INTERCOSTAL NERVE BLOCK Right 02/28/2019   Procedure: Intercostal Nerve Block;  Surgeon: Shyrl Linnie KIDD, MD;  Location: [facility] OR;  Service: Thoracic;  Laterality: Right;   NODE DISSECTION Right 02/28/2019   Procedure: Node Dissection;  Surgeon: Shyrl Linnie KIDD, MD;  Location: [facility];  Service: Thoracic;  Laterality: Right;   POLYPECTOMY  01/07/2021   Procedure: POLYPECTOMY;  Surgeon: Albertus Gordy HERO, MD;  Location: [facility];  Service: Gastroenterology;;   ROTATOR CUFF REPAIR  1997   left side   SKIN SURGERY     large area of MRSA on back/ over 10 years ago   TONSILLECTOMY     UPPER GASTROINTESTINAL ENDOSCOPY     VIDEO BRONCHOSCOPY N/A 02/28/2019   Procedure: VIDEO BRONCHOSCOPY;  Surgeon: Shyrl Linnie KIDD, MD;  Location: [facility] OR;  Service: Thoracic;  Laterality: N/A;    Medications: I have reviewed the patient's current medications. Allergies: No Known Allergies  Family History  Problem Relation Age of Onset   Hypertension Mother    Arthritis  Mother    Hip dysplasia Mother    Heart disease Mother    Heart disease Father 51       died of MI   Hyperlipidemia Father    HIV Brother    Colon cancer Neg Hx    Esophageal cancer Neg Hx    Rectal cancer Neg Hx    Stomach cancer Neg Hx    Cancer Neg Hx    Diabetes Neg Hx    Stroke Neg Hx    Colitis Neg Hx    Social History:  reports that he quit smoking about 9 years ago. His smoking use included cigarettes. He started smoking about 24 years ago. He has a 3.8 pack-year smoking history. He has been exposed to tobacco smoke. He has never used smokeless tobacco. He reports current alcohol use of about 15.0 standard drinks of alcohol per week. He reports that he does not use drugs.  ROS: All systems are reviewed and negative except as noted. Rest negative    Physical Exam:  Vital signs in last 24 hours: Temp:  [98.3 F (36.8 C)-102.3 F (39.1 C)] 98.3 F (36.8 C) (07/17 0627) Pulse Rate:  [88-123] 88 (07/17 0627) Resp:  [15-22] 18 (07/17 0627) BP: (118-155)/(52-74) 150/68 (07/17 0627) SpO2:  [91 %-98 %] 95 % (07/17 0627) Weight:  [138.3 kg-141.5 kg] 141.5 kg (07/16 2236)  Cardiovascular: Skin warm; not flushed Respiratory: Breaths quiet; no shortness of breath Abdomen: No masses Neurological: Normal sensation to touch Musculoskeletal: Normal motor function arms and legs Lymphatics: No inguinal adenopathy Skin: No rashes Genitourinary:mild swelling right scrotum - soft and supple and no skin changes   Laboratory Data:  Results for orders placed or performed during the hospital encounter of 08/09/23 (from the past 72 hours)  Resp panel by RT-PCR (RSV, Flu A&B, Covid) Anterior Nasal Swab     Status: None   Collection Time: 08/09/23  5:38 PM   Specimen: Anterior Nasal Swab  Result Value Ref Range   SARS Coronavirus 2 by RT PCR NEGATIVE NEGATIVE    Comment: (NOTE) SARS-CoV-2 target nucleic acids are NOT DETECTED.  The SARS-CoV-2 RNA is generally detectable in upper respiratory specimens during the acute phase of infection. The lowest concentration of SARS-CoV-2 viral copies this assay can detect is 138 copies/mL. A negative result does not preclude SARS-Cov-2 infection and should not be used as the sole basis for treatment or other patient management decisions. A negative result may occur with  improper specimen collection/handling, submission of specimen other than nasopharyngeal swab, presence of viral mutation(s) within the areas targeted by this assay, and inadequate number of viral copies(<138 copies/mL). A negative result must be combined with clinical observations, patient history, and epidemiological information. The expected result is Negative.  Fact Sheet for Patients:   BloggerCourse.com  Fact Sheet for Healthcare Providers:  SeriousBroker.it  This test is no t yet approved or cleared by the [place]  FDA and  has been authorized for detection and/or diagnosis of SARS-CoV-2 by FDA under an Emergency Use Authorization (EUA). This EUA will remain  in effect (meaning this test can be used) for the duration of the COVID-19 declaration under Section 564(b)(1) of the Act, 21 U.S.C.section 360bbb-3(b)(1), unless the authorization is terminated  or revoked sooner.       Influenza A by PCR NEGATIVE NEGATIVE   Influenza B by PCR NEGATIVE NEGATIVE    Comment: (NOTE) The Xpert Xpress SARS-CoV-2/FLU/RSV plus assay is intended as an aid in the diagnosis of  influenza from Nasopharyngeal swab specimens and should not be used as a sole basis for treatment. Nasal washings and aspirates are unacceptable for Xpert Xpress SARS-CoV-2/FLU/RSV testing.  Fact Sheet for Patients: BloggerCourse.com  Fact Sheet for Healthcare Providers: SeriousBroker.it  This test is not yet approved or cleared by the [place]  FDA and has been authorized for detection and/or diagnosis of SARS-CoV-2 by FDA under an Emergency Use Authorization (EUA). This EUA will remain in effect (meaning this test can be used) for the duration of the COVID-19 declaration under Section 564(b)(1) of the Act, 21 U.S.C. section 360bbb-3(b)(1), unless the authorization is terminated or revoked.     Resp Syncytial Virus by PCR NEGATIVE NEGATIVE    Comment: (NOTE) Fact Sheet for Patients: BloggerCourse.com  Fact Sheet for Healthcare Providers: SeriousBroker.it  This test is not yet approved or cleared by the [place]  FDA and has been authorized for detection and/or diagnosis of SARS-CoV-2 by FDA under an Emergency Use Authorization (EUA).  This EUA will remain in effect (meaning this test can be used) for the duration of the COVID-19 declaration under Section 564(b)(1) of the Act, 21 U.S.C. section 360bbb-3(b)(1), unless the authorization is terminated or revoked.  Performed at [facility], [address], [place], [place] [address]   Lactic acid, plasma     Status: Abnormal   Collection Time: 08/09/23  6:10 PM  Result Value Ref Range   Lactic Acid, Venous 2.0 (HH) 0.5 - 1.9 mmol/L    Comment: Critical Value, Read Back and verified with Chanin Maynard RN,08/09/2023 @ 1934 by ADELBERT Performed at [facility], [address] [place], [place] [address]   Comprehensive metabolic panel     Status: Abnormal   Collection Time: 08/09/23  6:10 PM  Result Value Ref Range   Sodium 137 135 - 145 mmol/L   Potassium 3.5 3.5 - 5.1 mmol/L   Chloride 102 98 - 111 mmol/L   CO2 21 (L) 22 - 32 mmol/L   Glucose, Bld 113 (H) 70 - 99 mg/dL    Comment: Glucose reference range applies only to samples taken after fasting for at least 8 hours.   BUN 10 8 - 23 mg/dL   Creatinine, Ser 8.97 0.61 - 1.24 mg/dL   Calcium 9.6 8.9 - 89.6 mg/dL   Total Protein 7.7 6.5 - 8.1 g/dL   Albumin 4.7 3.5 - 5.0 g/dL   AST 29 15 - 41 U/L   ALT 30 0 - 44 U/L   Alkaline Phosphatase 78 38 - 126 U/L   Total Bilirubin 0.8 0.0 - 1.2 mg/dL   GFR, Estimated >39 >39 mL/min    Comment: (NOTE) Calculated using the CKD-EPI Creatinine Equation (2021)    Anion gap 15 5 - 15    Comment: Performed at [facility], [address], [place], [place] [address]  CBC with Differential     Status: Abnormal   Collection Time: 08/09/23  6:10 PM  Result Value Ref Range   WBC 18.4 (H) 4.0 - 10.5 K/uL   RBC 4.05 (L) 4.22 - 5.81 MIL/uL   Hemoglobin 12.5 (L) 13.0 - 17.0 g/dL   HCT 63.4 (L) 60.9 - 47.9 %   MCV 90.1 80.0 - 100.0 fL   MCH 30.9 26.0 - 34.0 pg   MCHC 34.2 30.0 - 36.0 g/dL   RDW 87.6 88.4 - 84.4 %    Platelets 220 150 - 400 K/uL   nRBC 0.0 0.0 - 0.2 %   Neutrophils  Relative % 86 %   Neutro Abs 15.8 (H) 1.7 - 7.7 K/uL   Lymphocytes Relative 6 %   Lymphs Abs 1.0 0.7 - 4.0 K/uL   Monocytes Relative 7 %   Monocytes Absolute 1.3 (H) 0.1 - 1.0 K/uL   Eosinophils Relative 0 %   Eosinophils Absolute 0.0 0.0 - 0.5 K/uL   Basophils Relative 0 %   Basophils Absolute 0.0 0.0 - 0.1 K/uL   Immature Granulocytes 1 %   Abs Immature Granulocytes 0.22 (H) 0.00 - 0.07 K/uL    Comment: Performed at [facility], [address] [place], [place] [address]  Protime-INR     Status: None   Collection Time: 08/09/23  6:10 PM  Result Value Ref Range   Prothrombin Time 14.6 11.4 - 15.2 seconds   INR 1.1 0.8 - 1.2    Comment: (NOTE) INR goal varies based on device and disease states. Performed at [facility], [address], [place], [place] [address]   Blood Culture (routine x 2)     Status: None (Preliminary result)   Collection Time: 08/09/23  6:10 PM   Specimen: BLOOD  Result Value Ref Range   Specimen Description      BLOOD RIGHT ANTECUBITAL Performed at [facility], [address], [place], [place] [address]    Special Requests      Blood Culture adequate volume BOTTLES DRAWN AEROBIC AND ANAEROBIC Performed at [facility], [address], [place], [place] [address]    Culture      NO GROWTH < 12 HOURS Performed at [facility], [address], [place], [place] [address]    Report Status PENDING   Urinalysis, w/ Reflex to Culture (Infection Suspected) -Urine, Clean Catch     Status: None   Collection Time: 08/09/23  6:10 PM  Result Value Ref Range   Specimen Source URINE, CLEAN CATCH    Color, Urine YELLOW YELLOW   APPearance CLEAR CLEAR   Specific Gravity, Urine 1.013 1.005 - 1.030   pH 5.0 5.0 - 8.0   Glucose, UA NEGATIVE NEGATIVE mg/dL   Hgb urine dipstick NEGATIVE NEGATIVE   Bilirubin Urine  NEGATIVE NEGATIVE   Ketones, ur NEGATIVE NEGATIVE mg/dL   Protein, ur NEGATIVE NEGATIVE mg/dL   Nitrite NEGATIVE NEGATIVE   Leukocytes,Ua NEGATIVE NEGATIVE   RBC / HPF 0-5 0 - 5 RBC/hpf   WBC, UA 6-10 0 - 5 WBC/hpf    Comment:        Reflex urine culture not performed if WBC <=10, OR if Squamous epithelial cells >5. If Squamous epithelial cells >5 suggest recollection.    Bacteria, UA NONE SEEN NONE SEEN   Squamous Epithelial / HPF 0-5 0 - 5 /HPF   Mucus PRESENT    Uric Acid Crys, UA PRESENT     Comment: Performed at [facility], [address] [place], [place] [address]  Blood Culture (routine x 2)     Status: None (Preliminary result)   Collection Time: 08/09/23  6:15 PM   Specimen: BLOOD  Result Value Ref Range   Specimen Description      BLOOD BLOOD RIGHT HAND Performed at [facility], [address], [place], [place] [address]    Special Requests      Blood Culture results may not be optimal due to an inadequate volume of blood received in culture bottles BOTTLES DRAWN AEROBIC AND ANAEROBIC Performed at [facility], [address]  [place], [place] [address]    Culture      NO GROWTH < 12 HOURS Performed at [facility], [address] [place], [place] [address]    Report Status PENDING   Lactic acid, plasma     Status: Abnormal   Collection Time: 08/09/23  8:10 PM  Result Value Ref Range   Lactic Acid, Venous 2.3 (HH) 0.5 - 1.9 mmol/L    Comment: Critical value noted. Value is consistent with previously reported and called value  Performed at [facility], [address] [place], [place] [address]   Lactic acid, plasma     Status: None   Collection Time: 08/10/23  6:07 AM  Result Value Ref Range   Lactic Acid, Venous 1.4 0.5 - 1.9 mmol/L    Comment: Performed at [facility], [address], [place], [place] [address]  CBC     Status: Abnormal    Collection Time: 08/10/23  6:07 AM  Result Value Ref Range   WBC 18.8 (H) 4.0 - 10.5 K/uL   RBC 3.80 (L) 4.22 - 5.81 MIL/uL   Hemoglobin 11.9 (L) 13.0 - 17.0 g/dL   HCT 64.2 (L) 60.9 - 47.9 %   MCV 93.9 80.0 - 100.0 fL   MCH 31.3 26.0 - 34.0 pg   MCHC 33.3 30.0 - 36.0 g/dL   RDW 87.5 88.4 - 84.4 %   Platelets 196 150 - 400 K/uL   nRBC 0.0 0.0 - 0.2 %    Comment: Performed at [facility], [address], [place], [place] [address]  Basic metabolic panel     Status: Abnormal   Collection Time: 08/10/23  6:07 AM  Result Value Ref Range   Sodium 135 135 - 145 mmol/L   Potassium 3.2 (L) 3.5 - 5.1 mmol/L   Chloride 104 98 - 111 mmol/L   CO2 22 22 - 32 mmol/L   Glucose, Bld 138 (H) 70 - 99 mg/dL    Comment: Glucose reference range applies only to samples taken after fasting for at least 8 hours.   BUN 14 8 - 23 mg/dL   Creatinine, Ser 9.11 0.61 - 1.24 mg/dL   Calcium 8.6 (L) 8.9 - 10.3 mg/dL   GFR, Estimated >39 >39 mL/min    Comment: (NOTE) Calculated using the CKD-EPI Creatinine Equation (2021)    Anion gap 9 5 - 15    Comment: Performed at [facility], [address], [place], [place] [address]   Recent Results (from the past 240 hours)  Resp panel by RT-PCR (RSV, Flu A&B, Covid) Anterior Nasal Swab     Status: None   Collection Time: 08/09/23  5:38 PM   Specimen: Anterior Nasal Swab  Result Value Ref Range Status   SARS Coronavirus 2 by RT PCR NEGATIVE NEGATIVE Final    Comment: (NOTE) SARS-CoV-2 target nucleic acids are NOT DETECTED.  The SARS-CoV-2 RNA is generally detectable in upper respiratory specimens during the acute phase of infection. The lowest concentration of SARS-CoV-2 viral copies this assay can detect is 138 copies/mL. A negative result does not preclude SARS-Cov-2 infection and should not be used as the sole basis for treatment or other patient management decisions. A negative result may occur with  improper specimen  collection/handling, submission of specimen other than nasopharyngeal swab, presence of viral mutation(s) within the areas targeted by this assay, and inadequate number of viral copies(<138 copies/mL). A negative result must be combined with clinical observations,  patient history, and epidemiological information. The expected result is Negative.  Fact Sheet for Patients:  BloggerCourse.com  Fact Sheet for Healthcare Providers:  SeriousBroker.it  This test is no t yet approved or cleared by the [place]  FDA and  has been authorized for detection and/or diagnosis of SARS-CoV-2 by FDA under an Emergency Use Authorization (EUA). This EUA will remain  in effect (meaning this test can be used) for the duration of the COVID-19 declaration under Section 564(b)(1) of the Act, 21 U.S.C.section 360bbb-3(b)(1), unless the authorization is terminated  or revoked sooner.       Influenza A by PCR NEGATIVE NEGATIVE Final   Influenza B by PCR NEGATIVE NEGATIVE Final    Comment: (NOTE) The Xpert Xpress SARS-CoV-2/FLU/RSV plus assay is intended as an aid in the diagnosis of influenza from Nasopharyngeal swab specimens and should not be used as a sole basis for treatment. Nasal washings and aspirates are unacceptable for Xpert Xpress SARS-CoV-2/FLU/RSV testing.  Fact Sheet for Patients: BloggerCourse.com  Fact Sheet for Healthcare Providers: SeriousBroker.it  This test is not yet approved or cleared by the [place]  FDA and has been authorized for detection and/or diagnosis of SARS-CoV-2 by FDA under an Emergency Use Authorization (EUA). This EUA will remain in effect (meaning this test can be used) for the duration of the COVID-19 declaration under Section 564(b)(1) of the Act, 21 U.S.C. section 360bbb-3(b)(1), unless the authorization is terminated or revoked.     Resp Syncytial  Virus by PCR NEGATIVE NEGATIVE Final    Comment: (NOTE) Fact Sheet for Patients: BloggerCourse.com  Fact Sheet for Healthcare Providers: SeriousBroker.it  This test is not yet approved or cleared by the [place]  FDA and has been authorized for detection and/or diagnosis of SARS-CoV-2 by FDA under an Emergency Use Authorization (EUA). This EUA will remain in effect (meaning this test can be used) for the duration of the COVID-19 declaration under Section 564(b)(1) of the Act, 21 U.S.C. section 360bbb-3(b)(1), unless the authorization is terminated or revoked.  Performed at [facility], [address] [place], [place] [address]   Blood Culture (routine x 2)     Status: None (Preliminary result)   Collection Time: 08/09/23  6:10 PM   Specimen: BLOOD  Result Value Ref Range Status   Specimen Description   Final    BLOOD RIGHT ANTECUBITAL Performed at [facility], [address] [place], [place] [address]    Special Requests   Final    Blood Culture adequate volume BOTTLES DRAWN AEROBIC AND ANAEROBIC Performed at [facility], [address], [place], [place] [address]    Culture   Final    NO GROWTH < 12 HOURS Performed at [facility], [address] [place], [place] [address]    Report Status PENDING  Incomplete  Blood Culture (routine x 2)     Status: None (Preliminary result)   Collection Time: 08/09/23  6:15 PM   Specimen: BLOOD  Result Value Ref Range Status   Specimen Description   Final    BLOOD BLOOD RIGHT HAND Performed at [facility], [address], [place], [place] [address]    Special Requests   Final    Blood Culture results may not be optimal due to an inadequate volume of blood received in culture bottles BOTTLES DRAWN AEROBIC AND ANAEROBIC Performed at [facility], [address]  [place], [place] [address]    Culture   Final    NO  GROWTH < 12 HOURS Performed at [facility], [address], [place], [place] [address]    Report Status PENDING  Incomplete   Creatinine: Recent Labs    08/09/23 1810 08/10/23 0607  CREATININE 1.02 0.88    Xrays: See report/chart reviwed  Impression/Assessment:  Right orchitis   Plan:  Agree with Ole Bras reocmmendations IV antibiotics and elevate scrotum and f/up with Urology  Glendia A Marisella Puccio 08/10/2023, 8:33 AM

## 2023-08-10 NOTE — H&P (Signed)
 History and Physical    Roger Mendoza. FMW:[id number] DOB: May 08, 1960 DOA: 08/09/2023  PCP: Mercer Clotilda SAUNDERS, MD  Patient coming from: Home  I have personally briefly reviewed patient's old medical records in [facility]  Chief Complaint: Dysuria, right testicular swelling  HPI: Roger Mendoza. is a 63 y.o. male with medical history significant for OSA on CPAP, morbid obesity, stage Ia NSCLC s/p right lower lobectomy 2021 who presented to the ED for evaluation of dysuria and right testicular swelling.  Patient states that he has been dealing with burning on urination for quite a while now.  He says he has been on different medications and attempts to treat this.  He says he has an appointment with urology tomorrow.  This morning he noticed new swelling and pain involving his right testicle.  He has been having chills and diaphoresis.  He has not seen any discharge from his urethra.  [facility] ED Course  Labs/Imaging on admission: I have personally reviewed following labs and imaging studies.  Initial vitals showed BP 150/74, pulse 121, RR 18, temp 101, SpO2 98% on room air Tmax 102.3 F.  Labs showed WBC 18.4, hemoglobin 12.5, platelets 220, sodium 137, potassium 3.5, bicarb 21, BUN 10, creatinine 1.02, serum glucose 113, LFTs within normal limits, lactic acid 2.0 > 2.3.  UA showed negative nitrates, negative leukocytes, 0-5 RBCs, 06/30/2008 WBCs, no bacteria.  Blood cultures in process.  SARS-CoV-2, influenza, RSV PCR negative.  Portable chest x-ray negative for focal consolidation, edema, effusion.  CT abdomen/pelvis with contrast showed fatty liver without other focal abnormality.  Scrotal ultrasound showed changes consistent with right-sided epididymo-orchitis and right hydrocele.  Patient was given 1 L LR, IV Zosyn .  EDP spoke with urology (Dr. McDiarmid) who recommended medical admission, IV antibiotics.  The hospitalist service was consulted  to admit.  Review of Systems: All systems reviewed and are negative except as documented in history of present illness above.   Past Medical History:  Diagnosis Date   Allergic rhinitis, seasonal    Allergy    seasonal   Cancer (HCC)    lung -  67yrs ago- is not under any kind of treatment   Colon polyp 02/2012   tubular adenoma, Dr. Gordy Albertus Sheerer Quervain's tenosynovitis, right    hx/o   Epididymitis    hx/o as adult   Erectile dysfunction    Family history of premature CAD    father   Heart murmur    Hemorrhoids    hx of   Hyperlipidemia    Left inguinal hernia    tiny as of 03/2014   MRSA (methicillin resistant staph aureus) culture positive    history of MRSA skin on pt's back/ unsure of date   Normal cardiac stress test    remote past per patient, [place], [place]   Obesity    OSA on CPAP    Recurrent UTI    Dr. Aleene, [facility]   Sleep apnea    uses c-pap   Tinea pedis    Tobacco use    4 cigarrettes daily   Wears glasses     Past Surgical History:  Procedure Laterality Date   ADENOIDECTOMY     BIOPSY  01/07/2021   Procedure: BIOPSY;  Surgeon: Albertus Gordy HERO, MD;  Location: [facility];  Service: Gastroenterology;;   CARDIAC CATHETERIZATION  02/11/2005   COLONOSCOPY  02/2012   Dr. Gordy Albertus, repeat 02/2017   COLONOSCOPY  WITH PROPOFOL  N/A 01/07/2021   Procedure: COLONOSCOPY WITH PROPOFOL ;  Surgeon: Albertus Gordy HERO, MD;  Location: [facility];  Service: Gastroenterology;  Laterality: N/A;   ESOPHAGEAL DILATION     INTERCOSTAL NERVE BLOCK Right 02/28/2019   Procedure: Intercostal Nerve Block;  Surgeon: Shyrl Linnie KIDD, MD;  Location: [facility] OR;  Service: Thoracic;  Laterality: Right;   NODE DISSECTION Right 02/28/2019   Procedure: Node Dissection;  Surgeon: Shyrl Linnie KIDD, MD;  Location: [facility] OR;  Service: Thoracic;  Laterality: Right;   POLYPECTOMY  01/07/2021   Procedure: POLYPECTOMY;  Surgeon: Albertus Gordy HERO, MD;  Location: [facility];  Service:  Gastroenterology;;   ROTATOR CUFF REPAIR  1997   left side   SKIN SURGERY     large area of MRSA on back/ over 10 years ago   TONSILLECTOMY     UPPER GASTROINTESTINAL ENDOSCOPY     VIDEO BRONCHOSCOPY N/A 02/28/2019   Procedure: VIDEO BRONCHOSCOPY;  Surgeon: Shyrl Linnie KIDD, MD;  Location: [facility] OR;  Service: Thoracic;  Laterality: N/A;    Social History: Social History   Tobacco Use   Smoking status: Former    Current packs/day: 0.00    Average packs/day: 0.3 packs/day for 15.0 years (3.8 ttl pk-yrs)    Types: Cigarettes    Start date: 03/31/1999    Quit date: 03/31/2014    Years since quitting: 9.3    Passive exposure: Past   Smokeless tobacco: Never  Vaping Use   Vaping status: Never Used  Substance Use Topics   Alcohol use: Yes    Alcohol/week: 15.0 standard drinks of alcohol    Types: 3 Glasses of wine, 12 Cans of beer per week    Comment: couple glasses of wine per day   Drug use: No   No Known Allergies  Family History  Problem Relation Age of Onset   Hypertension Mother    Arthritis Mother    Hip dysplasia Mother    Heart disease Mother    Heart disease Father 76       died of MI   Hyperlipidemia Father    HIV Brother    Colon cancer Neg Hx    Esophageal cancer Neg Hx    Rectal cancer Neg Hx    Stomach cancer Neg Hx    Cancer Neg Hx    Diabetes Neg Hx    Stroke Neg Hx    Colitis Neg Hx      Prior to Admission medications   Medication Sig Start Date End Date Taking? Authorizing Provider  acetaminophen  (TYLENOL ) 325 MG tablet Take 650 mg by mouth every 6 (six) hours as needed.    [provider]  ASPIRIN  LOW DOSE 81 MG EC tablet TAKE 1 TABLET BY MOUTH EVERY DAY 04/12/21   Mercer Clotilda SAUNDERS, MD  Cyanocobalamin  [facility] VITAMIN B12 PO) Take by mouth.    [provider]  Glycerin-Hypromellose-PEG 400 (VISINE DRY EYE) 0.2-0.2-1 % SOLN Place 1-2 drops into both eyes 3 (three) times daily as needed (dry/irritated eyes).    [provider]   hydrocortisone  (PROCTOSOL HC) 2.5 % rectal cream Place 1 application rectally 2 (two) times daily. Patient taking differently: Place 1 application  rectally 2 (two) times daily as needed for hemorrhoids. 02/12/18   Mercer Clotilda SAUNDERS, MD  meloxicam  (MOBIC ) 7.5 MG tablet TAKE 1 TABLET BY MOUTH EVERY DAY AS NEEDED FOR PAIN 05/04/21   Joane Artist RAMAN, MD  NON FORMULARY C PAP    [provider]  Omega-3 Fatty Acids (FISH OIL PO) Take by mouth.    [provider]  pramoxine-hydrocortisone  (ANALPRAM HC) cream Apply topically 3 (three) times daily. 09/17/21   Mercer Clotilda SAUNDERS, MD  sildenafil  (REVATIO ) 20 MG tablet TAKE 1 TO 5 TABLETS BY MOUTH DAILY AS NEEDED PRIOR TO SEXUAL ACTIVITY 09/17/21   Mercer Clotilda SAUNDERS, MD  tiZANidine  (ZANAFLEX ) 2 MG tablet TAKE 1 TO 2 TABLETS BY MOUTH AT BEDTIME AS NEEDED FOR MUSCLE SPASMS 02/04/22   Mercer Clotilda SAUNDERS, MD  traMADol  (ULTRAM ) 50 MG tablet Take 50 mg by mouth every 6 (six) hours as needed (pain). 12/09/20   [provider]  VITAMIN E PO Take by mouth.    [provider]    Physical Exam: Vitals:   08/09/23 2100 08/09/23 2130 08/09/23 2233 08/09/23 2236  BP: (!) 133/58 (!) 123/54 (!) 144/62   Pulse: (!) 105 (!) 101 98   Resp: 16 17 16    Temp:   99 F (37.2 C)   TempSrc:   Oral   SpO2: 91% 95% 96%   Weight:    (!) 141.5 kg  Height:    6' 1 (1.854 m)   Constitutional: NAD, calm, comfortable Eyes: EOMI, lids and conjunctivae normal ENMT: Mucous membranes are moist. Posterior pharynx clear of any exudate or lesions.Normal dentition.  Neck: normal, supple, no masses. Respiratory: clear to auscultation bilaterally, no wheezing, no crackles. Normal respiratory effort. No accessory muscle use.  Cardiovascular: Regular rate and rhythm, no murmurs / rubs / gallops. No extremity edema. 2+ pedal pulses. Abdomen: no tenderness, no masses palpated. GU: Swelling of right testes, tender to palpation and elevation.  No urethral  discharge. Musculoskeletal: no clubbing / cyanosis. No joint deformity upper and lower extremities. Good ROM, no contractures. Normal muscle tone.  Skin: no rashes, lesions, ulcers. No induration Neurologic: Sensation intact. Strength 5/5 in all 4.  Psychiatric: Normal judgment and insight. Alert and oriented x 3. Normal mood.   EKG: Personally reviewed. Sinus tachycardia, rate 106, LVH, T wave inversions inferolateral leads.  Tachycardia and T wave changes new when compared to previous.  Assessment/Plan Principal Problem:   Sepsis (HCC) Active Problems:   Epididymoorchitis   OSA on CPAP   Roger Mendoza. is a 63 y.o. male with medical history significant for OSA, morbid obesity, stage Ia NSCLC s/p right lower lobectomy 2021 who is admitted with sepsis due to right sided epididymoorchitis.  Assessment and Plan: Sepsis due to right epididymoorchitis: Patient presenting with leukocytosis, tachycardia, fever.  Scrotal ultrasound consistent with right-sided epididymoorchitis, no torsion.  UA was negative. - Start IV Levaquin  - Continue IV fluid hydration overnight - Follow blood cultures - Analgesics as needed  OSA: Continue CPAP nightly.   DVT prophylaxis:  Code Status: Full code, confirmed with patient on admission Family Communication: Spouse at bedside Disposition Plan: From home and likely discharge to home pending clinical progress Consults called: EDP discussed with urology Severity of Illness: The appropriate patient status for this patient is INPATIENT. Inpatient status is judged to be reasonable and necessary in order to provide the required intensity of service to ensure the patient's safety. The patient's presenting symptoms, physical exam findings, and initial radiographic and laboratory data in the context of their chronic comorbidities is felt to place them at high risk for further clinical deterioration. Furthermore, it is not anticipated that the patient will  be medically stable for discharge from the hospital within 2 midnights of admission.   *  I certify that at the point of admission it is my clinical judgment that the patient will require inpatient hospital care spanning beyond 2 midnights from the point of admission due to high intensity of service, high risk for further deterioration and high frequency of surveillance required.Roger Jorie Blanch MD [facility]  If 7PM-7AM, please contact night-coverage [email or web address]  08/10/2023, 12:43 AM

## 2023-08-11 ENCOUNTER — Other Ambulatory Visit (HOSPITAL_COMMUNITY): Payer: Self-pay

## 2023-08-11 DIAGNOSIS — A419 Sepsis, unspecified organism: Secondary | ICD-10-CM | POA: Diagnosis not present

## 2023-08-11 LAB — CBC
HCT: 36.8 % — ABNORMAL LOW (ref 39.0–52.0)
Hemoglobin: 12.2 g/dL — ABNORMAL LOW (ref 13.0–17.0)
MCH: 30.7 pg (ref 26.0–34.0)
MCHC: 33.2 g/dL (ref 30.0–36.0)
MCV: 92.5 fL (ref 80.0–100.0)
Platelets: 175 K/uL (ref 150–400)
RBC: 3.98 MIL/uL — ABNORMAL LOW (ref 4.22–5.81)
RDW: 12.2 % (ref 11.5–15.5)
WBC: 12.9 K/uL — ABNORMAL HIGH (ref 4.0–10.5)
nRBC: 0 % (ref 0.0–0.2)

## 2023-08-11 MED ORDER — POTASSIUM CHLORIDE CRYS ER 20 MEQ PO TBCR
40.0000 meq | EXTENDED_RELEASE_TABLET | Freq: Once | ORAL | Status: AC
  Administered 2023-08-11: 40 meq via ORAL
  Filled 2023-08-11: qty 2

## 2023-08-11 MED ORDER — LEVOFLOXACIN 750 MG PO TABS
750.0000 mg | ORAL_TABLET | Freq: Every day | ORAL | 0 refills | Status: AC
  Filled 2023-08-11: qty 7, 7d supply, fill #0

## 2023-08-11 MED ORDER — FLORANEX PO PACK
1.0000 g | PACK | Freq: Three times a day (TID) | ORAL | 0 refills | Status: AC
  Filled 2023-08-11: qty 24, 8d supply, fill #0

## 2023-08-11 NOTE — Progress Notes (Signed)
 Subjective: NAEON. Pt feels well this am. Pain largely resolved. Eager to discharge home  Objective: Vital signs in last 24 hours: Temp:  [98.4 F (36.9 C)-99 F (37.2 C)] 98.4 F (36.9 C) (07/18 0850) Pulse Rate:  [83-89] 83 (07/18 0850) Resp:  [18] 18 (07/18 0850) BP: (139-149)/(62-75) 139/73 (07/18 0850) SpO2:  [95 %-97 %] 96 % (07/18 0850) FiO2 (%):  [21 %] 21 % (07/17 2105)  Assessment/Plan: #right epididimoorchitis #right hydrocele  10d Levaquin . Follow up with [facility] in about 1 week after ABX completed. Swelling may take up to 6 weeks for resolution OK to discharge from urologic perspective  Intake/Output from previous day: 07/17 0701 - 07/18 0700 In: 1110 [P.O.:960; IV Piggyback:150] Out: 400 [Urine:400]  Intake/Output this shift: No intake/output data recorded.  Physical Exam:  General: Alert and oriented CV: No cyanosis Lungs: equal chest rise Abdomen: Soft, NTND, no rebound or guarding Gu: swollen right testicle. Pain improved  Lab Results: Recent Labs    08/09/23 1810 08/10/23 0607  HGB 12.5* 11.9*  HCT 36.5* 35.7*   BMET Recent Labs    08/09/23 1810 08/10/23 0607  NA 137 135  K 3.5 3.2*  CL 102 104  CO2 21* 22  GLUCOSE 113* 138*  BUN 10 14  CREATININE 1.02 0.88  CALCIUM 9.6 8.6*  HGB 12.5* 11.9*  WBC 18.4* 18.8*     Studies/Results: US  SCROTUM W/DOPPLER Result Date: 08/09/2023 CLINICAL DATA:  Scrotal swelling EXAM: SCROTAL ULTRASOUND DOPPLER ULTRASOUND OF THE TESTICLES TECHNIQUE: Complete ultrasound examination of the testicles, epididymis, and other scrotal structures was performed. Color and spectral Doppler ultrasound were also utilized to evaluate blood flow to the testicles. COMPARISON:  CT from earlier in the same day FINDINGS: Right testicle Measurements: 4.8 x 3.5 x 3.5 cm. No mass or microlithiasis visualized. Left testicle Measurements: 4.4 x 2.1 x 3.1 cm. No mass or microlithiasis visualized. Right epididymis:   Enlarged and hypervascular in nature. Left epididymis:  Normal in size and appearance. Hydrocele:  Right-sided hydrocele is noted. Varicocele:  None visualized. Pulsed Doppler interrogation of both testes demonstrates normal low resistance arterial and venous waveform within the left testicle. Increased vascularity is noted within the right testicle. IMPRESSION: Changes consistent with right-sided epididymo-orchitis. Right hydrocele. Electronically Signed   By: Oneil Devonshire M.D.   On: 08/09/2023 21:42   CT ABDOMEN PELVIS W CONTRAST Result Date: 08/09/2023 CLINICAL DATA:  Right-sided flank pain and possible sepsis, initial encounter EXAM: CT ABDOMEN AND PELVIS WITH CONTRAST TECHNIQUE: Multidetector CT imaging of the abdomen and pelvis was performed using the standard protocol following bolus administration of intravenous contrast. RADIATION DOSE REDUCTION: This exam was performed according to the departmental dose-optimization program which includes automated exposure control, adjustment of the mA and/or kV according to patient size and/or use of iterative reconstruction technique. CONTRAST:  OMNIPAQUE  IOHEXOL  300 MG/ML  SOLN COMPARISON:  None Available. FINDINGS: Lower chest: No acute abnormality. Hepatobiliary: Fatty infiltration of the liver is noted. The gallbladder is within normal limits. Pancreas: Unremarkable. No pancreatic ductal dilatation or surrounding inflammatory changes. Spleen: Normal in size without focal abnormality. Adrenals/Urinary Tract: Adrenal glands are within normal limits. Kidneys demonstrate a normal enhancement pattern and excretion bilaterally. No renal calculi or obstructive changes are seen. The bladder is decompressed. Stomach/Bowel: No obstructive or inflammatory changes of the colon are noted. The appendix is within normal limits. Small bowel and stomach are unremarkable. Vascular/Lymphatic: No significant vascular findings are present. No enlarged abdominal or  pelvic lymph  nodes. Reproductive: Prostate is unremarkable. Other: No abdominal wall hernia or abnormality. No abdominopelvic ascites. Musculoskeletal: No acute or significant osseous findings. IMPRESSION: Fatty liver. No other focal abnormality is noted. Electronically Signed   By: Oneil Devonshire M.D.   On: 08/09/2023 20:14   DG Chest Port 1 View Result Date: 08/09/2023 CLINICAL DATA:  Questionable sepsis EXAM: PORTABLE CHEST 1 VIEW COMPARISON:  X-ray 04/05/2019.  CT scan 04/03/2023 FINDINGS: Under penetrated radiograph. No consolidation, pneumothorax or effusion. Normal cardiopericardial silhouette without edema. Previous right lower lobe lung resection. IMPRESSION: No acute cardiopulmonary disease. Electronically Signed   By: Ranell Bring M.D.   On: 08/09/2023 18:40      LOS: 2 days   Ole Bourdon, NP [facility] Pager: [phone]  08/11/2023, 10:03 AM

## 2023-08-11 NOTE — Discharge Summary (Signed)
 Physician Discharge Summary  Roger Mendoza. FMW:[id number] DOB: 21-Jan-1961 DOA: 08/09/2023  PCP: Mercer Clotilda SAUNDERS, MD  Admit date: 08/09/2023 Discharge date: 08/11/2023  Admitted From: Home Discharge disposition: Home  Recommendations at discharge:  Please complete the course of antibiotics with 10 more days of oral Levaquin  with probiotics Continue to monitor your symptoms, follow[facility]up with [facility] if persistent. Continue to monitor blood pressure as an outpatient.     Brief narrative: Roger Ciavarella. is a 63 y.o. male with PMH significant for morbid obesity, OSA on CPAP, stage I NSCLC s/p right lower lobectomy 2021, HLD  7/16, patient presented to the ED with complaint of dysuria and right testicular swelling.  Patient states that he has been dealing with burning on urination for quite a while now.  He says he has been on different medications and attempts to treat this.  On the morning of presentation, patient noticed new swelling and pain involving his right testis.  He already had an upcoming appointment with urology on the next day 7/17 but presented to the ED with immediate concern.  In the ED, patient had a fever up to 102.3, tachycardic to 120s, blood pressure in 140s and 150s Labs with WBC count 18.4, hemoglobin 12.5, lactic acid 2, sodium 137, potassium 3.5, BUN/creatinine 10/1.02 Respiratory virus panel unremarkable Urinalysis unremarkable Blood culture sent Chest x-ray unremarkable CT abdomen pelvis showed fatty liver and no other acute abnormalities. EKG showed sinus tachycardia at 106 bpm, QTc 435 ms Scrotal ultrasound showed right hydrocele and changes consistent with right-sided epididymo-orchitis.  EDP spoke with urology (Dr. McDiarmid) who recommended medical admission, IV antibiotics.   Patient was started on IV antibiotics, IV fluid. Admitted to [facility]  Subjective: Patient was seen and examined this morning.  Pleasant.  Lying on bed.   Not in distress.  Testicular pain improving.  Wife at bedside. No recurrence of fever in the last 36 hours Wants to go home today.  Hospital course: Severe sepsis secondary to right epididymoorchitis POA patient presented with dysuria, right testicular swelling,  Noted to have fever, tachycardia Scrotal ultrasound showed right-sided epididymoorchitis, no torsion.  UA was negative. Blood culture sent. Started on IV Levaquin . Urology consult appreciated.  No intervention was needed. Fever subsided.  WBC count remains elevated but clinically patient is much better. Lactic acid level improved We will discharge him on oral Levaquin  10 more days with probiotics. To [facility] if symptoms persistent Recent Labs  Lab 08/09/23 1810 08/09/23 2010 08/10/23 0607  WBC 18.4*  --  18.8*  LATICACIDVEN 2.0* 2.3* 1.4    Hypokalemia Replacement given Recent Labs  Lab 08/09/23 1810 08/10/23 0607  K 3.5 3.2*   Elevated blood pressure Blood pressure fluctuating between 130s and 50s.  Patient wants to monitor it as an outpatient and follow-up with PCP.  Not on any meds  Morbid Obesity  Body mass index is 41.16 kg/m. Patient has been advised to make an attempt to improve diet and exercise patterns to aid in weight loss.   OSA Continue CPAP nightly   Mobility: Encourage ambulation.  Goals of care   Code Status: Full Code   Diet:  Diet Order             Diet general           Diet regular Room service appropriate? Yes; Fluid consistency: Thin  Diet effective now  Nutritional status:  Body mass index is 41.16 kg/m.       Wounds:  -    Discharge Exam:   Vitals:   08/10/23 1943 08/10/23 2105 08/11/23 0458 08/11/23 0850  BP: (!) 145/62  (!) 141/75 139/73  Pulse: 89 86 83 83  Resp: 18 18 18 18   Temp: 99 F (37.2 C)  98.4 F (36.9 C) 98.4 F (36.9 C)  TempSrc: Oral  Oral Oral  SpO2: 95% 97% 96% 96%  Weight:      Height:         Body mass index is 41.16 kg/m.  General exam: Pleasant, middle-aged African-American male Skin: No rashes, lesions or ulcers. HEENT: Atraumatic, normocephalic, no obvious bleeding Lungs: Clear to auscultation bilaterally,  CVS: S1, S2, no murmur,   GI/Abd: Soft, nontender, distended from obesity, bowel sound present,   CNS: Alert, awake, oriented x 3 Psychiatry: Mood appropriate Extremities: No pedal edema, no calf tenderness,   Follow ups:    Follow-up Information     Mercer Clotilda SAUNDERS, MD Follow up.   Specialty: Family Medicine Contact information: [address] [place] [address] [phone]                 Discharge Instructions:   Discharge Instructions     Call MD for:  difficulty breathing, headache or visual disturbances   Complete by: As directed    Call MD for:  extreme fatigue   Complete by: As directed    Call MD for:  hives   Complete by: As directed    Call MD for:  persistant dizziness or light-headedness   Complete by: As directed    Call MD for:  persistant nausea and vomiting   Complete by: As directed    Call MD for:  severe uncontrolled pain   Complete by: As directed    Call MD for:  temperature >100.4   Complete by: As directed    Diet general   Complete by: As directed    Discharge instructions   Complete by: As directed    Recommendations at discharge:   Please complete the course of antibiotics with 10 more days of oral Levaquin  with probiotics  Continue to monitor your symptoms, follow[facility] if persistent.  Continue to monitor blood pressure as an outpatient.    General discharge instructions: Follow with Primary MD Mercer Clotilda SAUNDERS, MD in 7 days  Please request your PCP  to go over your hospital tests, procedures, radiology results at the follow up. Please get your medicines reviewed and adjusted.  Your PCP may decide to repeat certain labs or tests as needed. Do not drive, operate heavy  machinery, perform activities at heights, swimming or participation in water activities or provide baby sitting services if your were admitted for syncope or siezures until you have seen by Primary MD or a Neurologist and advised to do so again. [place]  Controlled Substance Reporting System database was reviewed. Do not drive, operate heavy machinery, perform activities at heights, swim, participate in water activities or provide baby-sitting services while on medications for pain, sleep and mood until your outpatient physician has reevaluated you and advised to do so again.  You are strongly recommended to comply with the dose, frequency and duration of prescribed medications. Activity: As tolerated with Full fall precautions use walker/cane & assistance as needed Avoid using any recreational substances like cigarette, tobacco, alcohol, or non-prescribed drug. If you experience worsening of your admission symptoms,  develop shortness of breath, life threatening emergency, suicidal or homicidal thoughts you must seek medical attention immediately by calling 911 or calling your MD immediately  if symptoms less severe. You must read complete instructions/literature along with all the possible adverse reactions/side effects for all the medicines you take and that have been prescribed to you. Take any new medicine only after you have completely understood and accepted all the possible adverse reactions/side effects.  Wear Seat belts while driving. You were cared for by a hospitalist during your hospital stay. If you have any questions about your discharge medications or the care you received while you were in the hospital after you are discharged, you can call the unit and ask to speak with the hospitalist or the covering physician. Once you are discharged, your primary care physician will handle any further medical issues. Please note that NO REFILLS for any discharge medications will be authorized once  you are discharged, as it is imperative that you return to your primary care physician (or establish a relationship with a primary care physician if you do not have one).   Increase activity slowly   Complete by: As directed        Discharge Medications:   Allergies as of 08/11/2023   No Known Allergies      Medication List     TAKE these medications    Aspirin  Low Dose 81 MG tablet Generic drug: aspirin  EC TAKE 1 TABLET BY MOUTH EVERY DAY   hydrocortisone  2.5 % rectal cream Commonly known as: Proctosol HC Place 1 application rectally 2 (two) times daily. What changed:  when to take this reasons to take this   lactobacillus Pack Take 1 packet (1 g total) by mouth 3 (three) times daily with meals for 7 days.   levofloxacin  750 MG tablet Commonly known as: Levaquin  Take 1 tablet (750 mg total) by mouth daily for 10 days.   NON FORMULARY C PAP   pramoxine-hydrocortisone  cream Commonly known as: ANALPRAM HC Apply topically 3 (three) times daily. What changed:  how much to take when to take this reasons to take this   sildenafil  20 MG tablet Commonly known as: REVATIO  TAKE 1 TO 5 TABLETS BY MOUTH DAILY AS NEEDED PRIOR TO SEXUAL ACTIVITY   TH VITAMIN B12 PO Take 1 capsule by mouth daily.   Visine Dry Eye 0.2-0.2-1 % Soln Generic drug: Glycerin-Hypromellose-PEG 400 Place 2 drops into both eyes daily.   VITAMIN E PO Take 1 capsule by mouth daily.         The results of significant diagnostics from this hospitalization (including imaging, microbiology, ancillary and laboratory) are listed below for reference.    Procedures and Diagnostic Studies:   US  SCROTUM W/DOPPLER Result Date: 08/09/2023 CLINICAL DATA:  Scrotal swelling EXAM: SCROTAL ULTRASOUND DOPPLER ULTRASOUND OF THE TESTICLES TECHNIQUE: Complete ultrasound examination of the testicles, epididymis, and other scrotal structures was performed. Color and spectral Doppler ultrasound were also utilized  to evaluate blood flow to the testicles. COMPARISON:  CT from earlier in the same day FINDINGS: Right testicle Measurements: 4.8 x 3.5 x 3.5 cm. No mass or microlithiasis visualized. Left testicle Measurements: 4.4 x 2.1 x 3.1 cm. No mass or microlithiasis visualized. Right epididymis:  Enlarged and hypervascular in nature. Left epididymis:  Normal in size and appearance. Hydrocele:  Right-sided hydrocele is noted. Varicocele:  None visualized. Pulsed Doppler interrogation of both testes demonstrates normal low resistance arterial and venous waveform within the left testicle. Increased vascularity is  noted within the right testicle. IMPRESSION: Changes consistent with right-sided epididymo-orchitis. Right hydrocele. Electronically Signed   By: Oneil Devonshire M.D.   On: 08/09/2023 21:42   CT ABDOMEN PELVIS W CONTRAST Result Date: 08/09/2023 CLINICAL DATA:  Right-sided flank pain and possible sepsis, initial encounter EXAM: CT ABDOMEN AND PELVIS WITH CONTRAST TECHNIQUE: Multidetector CT imaging of the abdomen and pelvis was performed using the standard protocol following bolus administration of intravenous contrast. RADIATION DOSE REDUCTION: This exam was performed according to the departmental dose-optimization program which includes automated exposure control, adjustment of the mA and/or kV according to patient size and/or use of iterative reconstruction technique. CONTRAST:  OMNIPAQUE  IOHEXOL  300 MG/ML  SOLN COMPARISON:  None Available. FINDINGS: Lower chest: No acute abnormality. Hepatobiliary: Fatty infiltration of the liver is noted. The gallbladder is within normal limits. Pancreas: Unremarkable. No pancreatic ductal dilatation or surrounding inflammatory changes. Spleen: Normal in size without focal abnormality. Adrenals/Urinary Tract: Adrenal glands are within normal limits. Kidneys demonstrate a normal enhancement pattern and excretion bilaterally. No renal calculi or obstructive changes are seen. The  bladder is decompressed. Stomach/Bowel: No obstructive or inflammatory changes of the colon are noted. The appendix is within normal limits. Small bowel and stomach are unremarkable. Vascular/Lymphatic: No significant vascular findings are present. No enlarged abdominal or pelvic lymph nodes. Reproductive: Prostate is unremarkable. Other: No abdominal wall hernia or abnormality. No abdominopelvic ascites. Musculoskeletal: No acute or significant osseous findings. IMPRESSION: Fatty liver. No other focal abnormality is noted. Electronically Signed   By: Oneil Devonshire M.D.   On: 08/09/2023 20:14   DG Chest Port 1 View Result Date: 08/09/2023 CLINICAL DATA:  Questionable sepsis EXAM: PORTABLE CHEST 1 VIEW COMPARISON:  X-ray 04/05/2019.  CT scan 04/03/2023 FINDINGS: Under penetrated radiograph. No consolidation, pneumothorax or effusion. Normal cardiopericardial silhouette without edema. Previous right lower lobe lung resection. IMPRESSION: No acute cardiopulmonary disease. Electronically Signed   By: Ranell Bring M.D.   On: 08/09/2023 18:40     Labs:   Basic Metabolic Panel: Recent Labs  Lab 08/09/23 1810 08/10/23 0607  NA 137 135  K 3.5 3.2*  CL 102 104  CO2 21* 22  GLUCOSE 113* 138*  BUN 10 14  CREATININE 1.02 0.88  CALCIUM 9.6 8.6*   GFR Estimated Creatinine Clearance: 127 mL/min (by C-G formula based on SCr of 0.88 mg/dL). Liver Function Tests: Recent Labs  Lab 08/09/23 1810  AST 29  ALT 30  ALKPHOS 78  BILITOT 0.8  PROT 7.7  ALBUMIN 4.7   No results for input(s): LIPASE, AMYLASE in the last 168 hours. No results for input(s): AMMONIA in the last 168 hours. Coagulation profile Recent Labs  Lab 08/09/23 1810  INR 1.1    CBC: Recent Labs  Lab 08/09/23 1810 08/10/23 0607  WBC 18.4* 18.8*  NEUTROABS 15.8*  --   HGB 12.5* 11.9*  HCT 36.5* 35.7*  MCV 90.1 93.9  PLT 220 196   Cardiac Enzymes: No results for input(s): CKTOTAL, CKMB, CKMBINDEX, TROPONINI  in the last 168 hours. BNP: Invalid input(s): POCBNP CBG: No results for input(s): GLUCAP in the last 168 hours. D-Dimer No results for input(s): DDIMER in the last 72 hours. Hgb A1c No results for input(s): HGBA1C in the last 72 hours. Lipid Profile No results for input(s): CHOL, HDL, LDLCALC, TRIG, CHOLHDL, LDLDIRECT in the last 72 hours. Thyroid function studies No results for input(s): TSH, T4TOTAL, T3FREE, THYROIDAB in the last 72 hours.  Invalid input(s): FREET3 Anemia work up  No results for input(s): VITAMINB12, FOLATE, FERRITIN, TIBC, IRON, RETICCTPCT in the last 72 hours. Microbiology Recent Results (from the past 240 hours)  Resp panel by RT-PCR (RSV, Flu A&B, Covid) Anterior Nasal Swab     Status: None   Collection Time: 08/09/23  5:38 PM   Specimen: Anterior Nasal Swab  Result Value Ref Range Status   SARS Coronavirus 2 by RT PCR NEGATIVE NEGATIVE Final    Comment: (NOTE) SARS-CoV-2 target nucleic acids are NOT DETECTED.  The SARS-CoV-2 RNA is generally detectable in upper respiratory specimens during the acute phase of infection. The lowest concentration of SARS-CoV-2 viral copies this assay can detect is 138 copies/mL. A negative result does not preclude SARS-Cov-2 infection and should not be used as the sole basis for treatment or other patient management decisions. A negative result may occur with  improper specimen collection/handling, submission of specimen other than nasopharyngeal swab, presence of viral mutation(s) within the areas targeted by this assay, and inadequate number of viral copies(<138 copies/mL). A negative result must be combined with clinical observations, patient history, and epidemiological information. The expected result is Negative.  Fact Sheet for Patients:  BloggerCourse.com  Fact Sheet for Healthcare Providers:  SeriousBroker.it  This  test is no t yet approved or cleared by the [place]  FDA and  has been authorized for detection and/or diagnosis of SARS-CoV-2 by FDA under an Emergency Use Authorization (EUA). This EUA will remain  in effect (meaning this test can be used) for the duration of the COVID-19 declaration under Section 564(b)(1) of the Act, 21 U.S.C.section 360bbb-3(b)(1), unless the authorization is terminated  or revoked sooner.       Influenza A by PCR NEGATIVE NEGATIVE Final   Influenza B by PCR NEGATIVE NEGATIVE Final    Comment: (NOTE) The Xpert Xpress SARS-CoV-2/FLU/RSV plus assay is intended as an aid in the diagnosis of influenza from Nasopharyngeal swab specimens and should not be used as a sole basis for treatment. Nasal washings and aspirates are unacceptable for Xpert Xpress SARS-CoV-2/FLU/RSV testing.  Fact Sheet for Patients: BloggerCourse.com  Fact Sheet for Healthcare Providers: SeriousBroker.it  This test is not yet approved or cleared by the [place]  FDA and has been authorized for detection and/or diagnosis of SARS-CoV-2 by FDA under an Emergency Use Authorization (EUA). This EUA will remain in effect (meaning this test can be used) for the duration of the COVID-19 declaration under Section 564(b)(1) of the Act, 21 U.S.C. section 360bbb-3(b)(1), unless the authorization is terminated or revoked.     Resp Syncytial Virus by PCR NEGATIVE NEGATIVE Final    Comment: (NOTE) Fact Sheet for Patients: BloggerCourse.com  Fact Sheet for Healthcare Providers: SeriousBroker.it  This test is not yet approved or cleared by the [place]  FDA and has been authorized for detection and/or diagnosis of SARS-CoV-2 by FDA under an Emergency Use Authorization (EUA). This EUA will remain in effect (meaning this test can be used) for the duration of the COVID-19 declaration under  Section 564(b)(1) of the Act, 21 U.S.C. section 360bbb-3(b)(1), unless the authorization is terminated or revoked.  Performed at [facility], [address] [place], [place] [address]   Blood Culture (routine x 2)     Status: None (Preliminary result)   Collection Time: 08/09/23  6:10 PM   Specimen: BLOOD  Result Value Ref Range Status   Specimen Description   Final    BLOOD RIGHT ANTECUBITAL Performed at [facility], [address] [place], [place] [address]  Special Requests   Final    Blood Culture adequate volume BOTTLES DRAWN AEROBIC AND ANAEROBIC Performed at [facility], [address] [place], [place] [address]    Culture   Final    NO GROWTH 2 DAYS Performed at [facility], [address], [place], [place] [address]    Report Status PENDING  Incomplete  Blood Culture (routine x 2)     Status: None (Preliminary result)   Collection Time: 08/09/23  6:15 PM   Specimen: BLOOD  Result Value Ref Range Status   Specimen Description   Final    BLOOD BLOOD RIGHT HAND Performed at [facility], [address], [place], [place] [address]    Special Requests   Final    Blood Culture results may not be optimal due to an inadequate volume of blood received in culture bottles BOTTLES DRAWN AEROBIC AND ANAEROBIC Performed at [facility], [address], [place], [place] [address]    Culture   Final    NO GROWTH 2 DAYS Performed at [facility], [address] [place], [place] [address]    Report Status PENDING  Incomplete    Time coordinating discharge: 45 minutes  Signed: Kayson Tasker  [facility] 08/11/2023, 9:46 AM

## 2023-08-11 NOTE — Plan of Care (Signed)
  Problem: Education: Goal: Knowledge of General Education information will improve Description: Including pain rating scale, medication(s)/side effects and non-pharmacologic comfort measures Outcome: Adequate for Discharge   Problem: Health Behavior/Discharge Planning: Goal: Ability to manage health-related needs will improve Outcome: Adequate for Discharge   Problem: Clinical Measurements: Goal: Ability to maintain clinical measurements within normal limits will improve Outcome: Adequate for Discharge Goal: Will remain free from infection Outcome: Adequate for Discharge Goal: Diagnostic test results will improve Outcome: Adequate for Discharge Goal: Respiratory complications will improve Outcome: Adequate for Discharge Goal: Cardiovascular complication will be avoided Outcome: Adequate for Discharge   Problem: Activity: Goal: Risk for activity intolerance will decrease Outcome: Adequate for Discharge   Problem: Nutrition: Goal: Adequate nutrition will be maintained Outcome: Adequate for Discharge   Problem: Coping: Goal: Level of anxiety will decrease Outcome: Adequate for Discharge   Problem: Elimination: Goal: Will not experience complications related to bowel motility Outcome: Adequate for Discharge Goal: Will not experience complications related to urinary retention Outcome: Adequate for Discharge   Problem: Pain Managment: Goal: General experience of comfort will improve and/or be controlled Outcome: Adequate for Discharge   Problem: Safety: Goal: Ability to remain free from injury will improve Outcome: Adequate for Discharge   Problem: Skin Integrity: Goal: Risk for impaired skin integrity will decrease Outcome: Adequate for Discharge   Problem: Fluid Volume: Goal: Hemodynamic stability will improve Outcome: Adequate for Discharge   Problem: Clinical Measurements: Goal: Diagnostic test results will improve Outcome: Adequate for Discharge Goal: Signs  and symptoms of infection will decrease Outcome: Adequate for Discharge   Problem: Respiratory: Goal: Ability to maintain adequate ventilation will improve Outcome: Adequate for Discharge

## 2023-08-14 ENCOUNTER — Telehealth: Payer: Self-pay | Admitting: *Deleted

## 2023-08-14 LAB — CULTURE, BLOOD (ROUTINE X 2)
Culture: NO GROWTH
Culture: NO GROWTH
Special Requests: ADEQUATE

## 2023-08-14 NOTE — Transitions of Care (Post Inpatient/ED Visit) (Signed)
   08/14/2023  Name: Roger Mendoza. MRN: [id number] DOB: 27-May-1960  Today's TOC FU Call Status: Today's TOC FU Call Status:: Unsuccessful Call (1st Attempt) Unsuccessful Call (1st Attempt) Date: 08/14/23  Attempted to reach the patient regarding the most recent Inpatient/ED visit.  Follow Up Plan: Additional outreach attempts will be made to reach the patient to complete the Transitions of Care (Post Inpatient/ED visit) call.   Cathlean Headland BSN RN [facility] Health Care Management Coordinator [email or web address] Direct Dial: [phone]  Fax: [phone] Website: [email or web address]

## 2023-08-15 ENCOUNTER — Telehealth: Payer: Self-pay

## 2023-08-15 NOTE — Transitions of Care (Post Inpatient/ED Visit) (Signed)
   08/15/2023  Name: Roger Mendoza. MRN: [id number] DOB: [birth year]  Today's TOC FU Call Status: Today's TOC FU Call Status:: Successful TOC FU Call Completed TOC FU Call Complete Date: 08/15/23  Attempted to reach the patient regarding the most recent Inpatient/ED visit. Spoke with patient who was walking into MD office. Will reach out again tomorrow to complete call.   Follow Up Plan: Additional outreach attempts will be made to reach the patient to complete the Transitions of Care (Post Inpatient/ED visit) call.   Alan Ee, RN, BSN, CEN [employer]- Transition of Care Team.  [facility] [phone]

## 2023-08-16 ENCOUNTER — Other Ambulatory Visit (HOSPITAL_COMMUNITY): Payer: Self-pay

## 2023-08-16 ENCOUNTER — Telehealth: Payer: Self-pay

## 2023-08-16 ENCOUNTER — Ambulatory Visit: Payer: Self-pay

## 2023-08-16 NOTE — Transitions of Care (Post Inpatient/ED Visit) (Signed)
 08/16/2023  Name: Roger Mendoza. MRN: [id number] DOB: Aug 09, 1960  Today's TOC FU Call Status: Today's TOC FU Call Status:: Successful TOC FU Call Completed TOC FU Call Complete Date: 08/16/23 Patient's Name and Date of Birth confirmed.  Transition Care Management Follow-up Telephone Call How have you been since you were released from the hospital?: Better Any questions or concerns?: Yes Patient Questions/Concerns:: waiting to hear back from urology Patient Questions/Concerns Addressed: Other:  Items Reviewed: Did you receive and understand the discharge instructions provided?: Yes Medications obtained,verified, and reconciled?: Yes (Medications Reviewed) Any new allergies since your discharge?: No Dietary orders reviewed?: Yes Type of Diet Ordered:: regular Do you have support at home?: Yes People in Home [RPT]: spouse  Medications Reviewed Today: Medications Reviewed Today     Reviewed by Rumalda Alan PENNER, RN (Registered Nurse) on 08/16/23 at 1310  Med List Status: <None>   Medication Order Taking? Sig Documenting Provider Last Dose Status Informant  ASPIRIN  LOW DOSE 81 MG EC tablet [id number]  TAKE 1 TABLET BY MOUTH EVERY DAY  Patient not taking: Reported on 08/16/2023   Mercer Clotilda SAUNDERS, MD  Active Self, Pharmacy Records  Cyanocobalamin  [facility] VITAMIN B12 PO) [id number]  Take 1 capsule by mouth daily.  Patient not taking: Reported on 08/16/2023   [provider]  Active Self, Pharmacy Records  Glycerin-Hypromellose-PEG [facility] DRY EYE) 0.2-0.2-1 % SOLN [id number] Yes Place 2 drops into both eyes daily. [provider]  Active Self, Pharmacy Records  hydrocortisone  (PROCTOSOL [facility]) 2.5 % rectal cream [id number] Yes Place 1 application rectally 2 (two) times daily. Mercer Clotilda SAUNDERS, MD  Active Self, Pharmacy Records  lactobacillus (FLORANEX/LACTINEX) PACK [id number]  Take 1 packet (1 g total) by mouth 3 (three) times daily with meals for 7 days. Arlice Reichert, MD  Active   levofloxacin  (LEVAQUIN ) 750 MG tablet [id number] Yes Take 1 tablet (750 mg total) by mouth daily for 10 days. Arlice Reichert, MD  Active   JESSE SCHLOSSMAN [id number]  C PAP [provider]  Active Self, Pharmacy Records  pramoxine-hydrocortisone  [facility]) cream [id number] Yes Apply topically 3 (three) times daily. Mercer Clotilda SAUNDERS, MD  Active Self, Pharmacy Records  sildenafil  (REVATIO ) 20 MG tablet [id number]  TAKE 1 TO 5 TABLETS BY MOUTH DAILY AS NEEDED PRIOR TO SEXUAL ACTIVITY  Patient not taking: Reported on 08/16/2023   Mercer Clotilda SAUNDERS, MD  Active Self, Pharmacy Records  VITAMIN E PO [id number]  Take 1 capsule by mouth daily.  Patient not taking: Reported on 08/16/2023   [provider]  Active Self, Pharmacy Records  Med List Note [name], CPhT 12/31/20 1226): CPAP Machine            Home Care and Equipment/Supplies: Were Home Health Services Ordered?: No Any new equipment or medical supplies ordered?: No  Functional Questionnaire: Do you need assistance with bathing/showering or dressing?: No Do you need assistance with meal preparation?: Yes (wife) Do you need assistance with eating?: No Do you have difficulty maintaining continence: No Do you need assistance with getting out of bed/getting out of a chair/moving?: No Do you have difficulty managing or taking your medications?: No  Follow up appointments reviewed: PCP Follow-up appointment confirmed?: No (denies wanting to follow up with PCP) MD Provider Line Number:[phone] Given: No Specialist Hospital Follow-up appointment confirmed?: Yes Date of Specialist follow-up appointment?: 08/24/23 Follow-Up Specialty Provider:: to see urology Do you need transportation to your follow-up appointment?: No Do you understand care  options if your condition(s) worsen?: Yes-patient verbalized understanding  SDOH Interventions Today    Flowsheet Row Most Recent Value  SDOH  Interventions   Food Insecurity Interventions Intervention Not Indicated  Housing Interventions Intervention Not Indicated  Transportation Interventions Intervention Not Indicated  Utilities Interventions Intervention Not Indicated   Spoke with patient who reports that he is doing ok.  Reports over and over again that he needs a note to cover for him with his disability from 7/28-7/31.  Reports hospitalist wrote for patient to return to work on 08/21/2023 however he is not cleared by urology until his appointment on 08/24/2023.   Patient reluctantly completed call.  Offered suggestions and encouraged patient to follow up with PCP. Patient declines and states that he does not need too.    Reviewed and offered suggestions to help patient with his concerns about returning to work.  Encouraged patient to follow up with urology. He states that he went to office in person yesterday and he is waiting for a call back.  Encouraged patient to take all his medications as prescribed. Reviewed Lactobacillus RX ( patient reports that he does not have this medications)  and that I spoke with Burnard at [facility] and that RX was transferred to [facility].  [facility] does not carry the medications that was ordered.  Patient was informed that RX was transferred, Encouraged patient to pick up medications.  Patient voiced understanding.  Patient denies any additional needs at this time.  Declined any additional follow up calls.    Alan Ee, RN, BSN, CEN [employer]- Transition of Care Team.  [facility] [phone]

## 2023-08-16 NOTE — Telephone Encounter (Signed)
 FYI Only or Action Required?: FYI only for provider. Declined triage, wanted f/u appt. Patient was last seen in primary care on 09/19/2022 by Mercer Clotilda SAUNDERS, MD.  Called Nurse Triage reporting Advice Only.  Symptoms began several days ago.  Interventions attempted: Nothing.  Symptoms are: unchanged.  Triage Disposition: See Physician Within 24 Hours  Patient/caregiver understands and will follow disposition?: Yes      Copied from CRM #[id number]. Topic: Clinical - Red Word Triage >> Aug 16, 2023  1:25 PM Jayma L wrote: Red Word that prompted transfer to Nurse Triage: patient called and stated he was seen in hospital on Friday and was discharged, was calling for a follow up appt, said he is still having swelling in testicle area and its not going down any. Said pain is easing up but he was told there is a infection in balls asking what he should do. Said he's holding fluid in that area.

## 2023-08-17 ENCOUNTER — Encounter: Payer: Self-pay | Admitting: Family Medicine

## 2023-08-17 ENCOUNTER — Ambulatory Visit: Admitting: Family Medicine

## 2023-08-17 VITALS — BP 130/68 | HR 94 | Temp 98.8°F | Ht 73.0 in | Wt 309.0 lb

## 2023-08-17 DIAGNOSIS — E876 Hypokalemia: Secondary | ICD-10-CM | POA: Diagnosis not present

## 2023-08-17 DIAGNOSIS — Z09 Encounter for follow-up examination after completed treatment for conditions other than malignant neoplasm: Secondary | ICD-10-CM

## 2023-08-17 DIAGNOSIS — R232 Flushing: Secondary | ICD-10-CM

## 2023-08-17 DIAGNOSIS — A419 Sepsis, unspecified organism: Secondary | ICD-10-CM

## 2023-08-17 DIAGNOSIS — N453 Epididymo-orchitis: Secondary | ICD-10-CM

## 2023-08-17 NOTE — Progress Notes (Unsigned)
 Established Patient Office Visit   Subjective  Patient ID: Roger Mendoza., male    DOB: [birth year]  Age: 63 y.o. MRN: [id number]  Chief Complaint  Patient presents with  . Hospitalization Follow-up    Patient came in today for hospital follow-up, seen for testicular swelling, 7/16, given levofloxacin , patient is still having swelling and pain,rate of pain 3 out of 10,  patient is sen Urology 7/31     Patient is a 63 year old male seen for HFU.  Patient hospitalized 7/16-18 for sepsis 2/2 right epididymoorchitis.  Presented to ED w/ dysuria that progressed into R testicular edema and pain.  Febrile and tachycardic in ED.  Scrotal u/s with R sided epididymoorchitis, no torsion.  UA negative. BCx obtained.  IV Levaquin  started.  Urology consulted.  Fever resolved.  WBC count remained elevated but clinical improvement noted.  Discharged on Levaquin  x 7 days.  Endorses improvement in pain and edema.  Pain now 3/10.  Requesting extension of work leave until after appointment with urology on 08/24/2023.    Patient Active Problem List   Diagnosis Date Noted  . Sepsis (HCC) 08/10/2023  . Epididymoorchitis 08/09/2023  . History of colonic polyps   . Benign neoplasm of transverse colon   . Benign neoplasm of descending colon   . Benign neoplasm of cecum   . Benign neoplasm of ileocecal valve   . Anal polyp   . Vitamin D  deficiency 08/26/2020  . Trigger thumb, left thumb 06/19/2020  . Right lower lobe pulmonary nodule 02/28/2019  . Abnormal CT of the chest 02/06/2019  . Solitary pulmonary nodule 02/06/2019  . Lung nodule 02/06/2019  . Daytime somnolence 07/20/2016  . Former smoker 05/11/2016  . Other fatigue 05/11/2016  . Hot flashes 05/11/2016  . Morbid obesity (HCC) 05/11/2016  . Skin lesion 05/11/2016  . External hemorrhoid 05/11/2016  . Erectile dysfunction 05/11/2016  . Encounter for hepatitis C screening test for low risk patient 05/11/2016  . Dyslipidemia 03/11/2016   . De Quervain's tenosynovitis, left 12/21/2011  . Routine general medical examination at a health care facility 11/02/2010  . OSA on CPAP 03/11/2010   Past Medical History:  Diagnosis Date  . Allergic rhinitis, seasonal   . Allergy    seasonal  . Cancer (HCC)    lung -  69yrs ago- is not under any kind of treatment  . Colon polyp 02/2012   tubular adenoma, Dr. Gordy Starch  . De Quervain's tenosynovitis, right    hx/o  . Epididymitis    hx/o as adult  . Erectile dysfunction   . Family history of premature CAD    father  . Heart murmur   . Hemorrhoids    hx of  . Hyperlipidemia   . Left inguinal hernia    tiny as of 03/2014  . MRSA (methicillin resistant staph aureus) culture positive    history of MRSA skin on pt's back/ unsure of date  . Normal cardiac stress test    remote past per patient, [place], [place]  . Obesity   . OSA on CPAP   . Recurrent UTI    Dr. Aleene, [facility]  . Sleep apnea    uses c-pap  . Tinea pedis   . Tobacco use    4 cigarrettes daily  . Wears glasses    Past Surgical History:  Procedure Laterality Date  . ADENOIDECTOMY    . BIOPSY  01/07/2021   Procedure: BIOPSY;  Surgeon: Starch Gordy HERO, MD;  Location: [facility];  Service: Gastroenterology;;  . CARDIAC CATHETERIZATION  02/11/2005  . COLONOSCOPY  02/2012   Dr. Gordy Starch, repeat 02/2017  . COLONOSCOPY WITH PROPOFOL  N/A 01/07/2021   Procedure: COLONOSCOPY WITH PROPOFOL ;  Surgeon: Starch Gordy HERO, MD;  Location: [facility];  Service: Gastroenterology;  Laterality: N/A;  . ESOPHAGEAL DILATION    . INTERCOSTAL NERVE BLOCK Right 02/28/2019   Procedure: Intercostal Nerve Block;  Surgeon: Shyrl Linnie KIDD, MD;  Location: [facility] OR;  Service: Thoracic;  Laterality: Right;  . NODE DISSECTION Right 02/28/2019   Procedure: Node Dissection;  Surgeon: Shyrl Linnie KIDD, MD;  Location: [facility] OR;  Service: Thoracic;  Laterality: Right;  . POLYPECTOMY  01/07/2021   Procedure: POLYPECTOMY;  Surgeon:  Starch Gordy HERO, MD;  Location: [facility];  Service: Gastroenterology;;  . MARCELINE CUFF REPAIR  1997   left side  . SKIN SURGERY     large area of MRSA on back/ over 10 years ago  . TONSILLECTOMY    . UPPER GASTROINTESTINAL ENDOSCOPY    . VIDEO BRONCHOSCOPY N/A 02/28/2019   Procedure: VIDEO BRONCHOSCOPY;  Surgeon: Shyrl Linnie KIDD, MD;  Location: [facility] OR;  Service: Thoracic;  Laterality: N/A;   Social History   Tobacco Use  . Smoking status: Former    Current packs/day: 0.00    Average packs/day: 0.3 packs/day for 15.0 years (3.8 ttl pk-yrs)    Types: Cigarettes    Start date: 03/31/1999    Quit date: 03/31/2014    Years since quitting: 9.3    Passive exposure: Past  . Smokeless tobacco: Never  Vaping Use  . Vaping status: Never Used  Substance Use Topics  . Alcohol use: Yes    Alcohol/week: 15.0 standard drinks of alcohol    Types: 3 Glasses of wine, 12 Cans of beer per week    Comment: couple glasses of wine per day  . Drug use: No   Family History  Problem Relation Age of Onset  . Hypertension Mother   . Arthritis Mother   . Hip dysplasia Mother   . Heart disease Mother   . Heart disease Father 59       died of MI  . Hyperlipidemia Father   . HIV Brother   . Colon cancer Neg Hx   . Esophageal cancer Neg Hx   . Rectal cancer Neg Hx   . Stomach cancer Neg Hx   . Cancer Neg Hx   . Diabetes Neg Hx   . Stroke Neg Hx   . Colitis Neg Hx    No Known Allergies  ROS Negative unless stated above    Objective:     BP 130/68 (BP Location: Left Arm, Patient Position: Sitting, Cuff Size: Large)   Pulse 94   Temp 98.8 F (37.1 C) (Oral)   Ht 6' 1 (1.854 m)   Wt (!) 309 lb (140.2 kg)   SpO2 98%   BMI 40.77 kg/m  BP Readings from Last 3 Encounters:  08/17/23 130/68  08/11/23 139/73  04/05/23 (!) 146/61   Wt Readings from Last 3 Encounters:  08/17/23 (!) 309 lb (140.2 kg)  08/09/23 (!) 311 lb 15.2 oz (141.5 kg)  04/05/23 (!) 308 lb (139.7 kg)       Physical Exam     08/16/2023    1:15 PM 09/19/2022    9:14 AM 02/04/2022    4:13 PM  Depression screen PHQ 2/9  Decreased Interest 0 0 0  Down, Depressed, Hopeless 0 0  0  PHQ - 2 Score 0 0 0  Altered sleeping  0 0  Tired, decreased energy  0 0  Change in appetite  0 0  Feeling bad or failure about yourself   0 0  Trouble concentrating  0 0  Moving slowly or fidgety/restless  0 0  Suicidal thoughts  0 0  PHQ-9 Score  0 0  Difficult doing work/chores  Not difficult at all Not difficult at all      09/19/2022    9:14 AM 08/21/2020    8:17 AM  GAD 7 : Generalized Anxiety Score  Nervous, Anxious, on Edge  0  Control/stop worrying 0 0  Worry too much - different things 0 0  Trouble relaxing 0 0  Restless 0 0  Easily annoyed or irritable 0 0  Afraid - awful might happen 0 0  Total GAD 7 Score  0  Anxiety Difficulty Not difficult at all      No results found for any visits on 08/17/23.    Assessment & Plan:   Hospital discharge follow-up    No follow-ups on file.   Clotilda JONELLE Single, MD

## 2023-08-24 ENCOUNTER — Other Ambulatory Visit (INDEPENDENT_AMBULATORY_CARE_PROVIDER_SITE_OTHER)

## 2023-08-24 ENCOUNTER — Other Ambulatory Visit: Payer: Self-pay

## 2023-08-24 DIAGNOSIS — A419 Sepsis, unspecified organism: Secondary | ICD-10-CM | POA: Diagnosis not present

## 2023-08-24 DIAGNOSIS — N453 Epididymo-orchitis: Secondary | ICD-10-CM

## 2023-08-24 LAB — COMPREHENSIVE METABOLIC PANEL WITH GFR
ALT: 39 U/L (ref 0–53)
AST: 27 U/L (ref 0–37)
Albumin: 4.6 g/dL (ref 3.5–5.2)
Alkaline Phosphatase: 74 U/L (ref 39–117)
BUN: 9 mg/dL (ref 6–23)
CO2: 25 meq/L (ref 19–32)
Calcium: 9.2 mg/dL (ref 8.4–10.5)
Chloride: 102 meq/L (ref 96–112)
Creatinine, Ser: 0.82 mg/dL (ref 0.40–1.50)
GFR: 93.32 mL/min (ref >60.00)
Glucose, Bld: 90 mg/dL (ref 70–99)
Potassium: 3.9 meq/L (ref 3.5–5.1)
Sodium: 138 meq/L (ref 135–145)
Total Bilirubin: 0.8 mg/dL (ref 0.2–1.2)
Total Protein: 7.7 g/dL (ref 6.0–8.3)

## 2023-08-24 LAB — CBC WITH DIFFERENTIAL/PLATELET
Basophils Absolute: 0 K/uL (ref 0.0–0.1)
Basophils Relative: 0.3 % (ref 0.0–3.0)
Eosinophils Absolute: 0.2 K/uL (ref 0.0–0.7)
Eosinophils Relative: 2.5 % (ref 0.0–5.0)
HCT: 37 % — ABNORMAL LOW (ref 39.0–52.0)
Hemoglobin: 12.3 g/dL — ABNORMAL LOW (ref 13.0–17.0)
Lymphocytes Relative: 24.6 % (ref 12.0–46.0)
Lymphs Abs: 2.4 K/uL (ref 0.7–4.0)
MCHC: 33.2 g/dL (ref 30.0–36.0)
MCV: 90.4 fl (ref 78.0–100.0)
Monocytes Absolute: 0.6 K/uL (ref 0.1–1.0)
Monocytes Relative: 5.7 % (ref 3.0–12.0)
Neutro Abs: 6.6 K/uL (ref 1.4–7.7)
Neutrophils Relative %: 66.9 % (ref 43.0–77.0)
Platelets: 281 K/uL (ref 150.0–400.0)
RBC: 4.1 Mil/uL — ABNORMAL LOW (ref 4.22–5.81)
RDW: 13.2 % (ref 11.5–15.5)
WBC: 9.9 K/uL (ref 4.0–10.5)

## 2023-08-25 ENCOUNTER — Telehealth: Payer: Self-pay | Admitting: Family Medicine

## 2023-08-25 NOTE — Telephone Encounter (Signed)
 Copied from CRM [id number]. Topic: General - Other >> Aug 25, 2023  3:27 PM Jasmin G wrote: Reason for CRM: Short term disability company needs records released to them from pt in order for him to get paid, please call case worker at [phone] ext [phone]

## 2023-08-25 NOTE — Telephone Encounter (Signed)
 Patient is having paper work sent over

## 2023-08-28 NOTE — Telephone Encounter (Signed)
 Paperwork received.

## 2023-08-29 NOTE — Telephone Encounter (Unsigned)
 Copied from CRM [id number]. Topic: General - Other >> Aug 25, 2023  3:27 PM Jasmin G wrote: Reason for CRM: Short term disability company needs records released to them from pt in order for him to get paid, please call case worker at [phone] ext [phone] >> Aug 29, 2023 10:34 AM [place] J wrote: Patient calling back to let Dr. Mercer know that he cannot wait for 30-days in order for his short term disability to be completed. His employer will not be paying him during that 30-day time frame and he is needing  for someone to call him as soon as possible today.  He is aware of the same day turn around time for a call back and will be looking out for a phone call. >> Aug 29, 2023 10:25 AM Lavanda D wrote: Patient is calling because he was advised that his paperwork for short term disability could take 30 days, he said he doesn't have this kind of time and needs it done sooner than that. Please call patient to confirm how long this request may take.

## 2023-08-29 NOTE — Telephone Encounter (Signed)
 Called and spoke with patient patient is aware that Dr. Mercer is in the process of filling out short term paperwork. Will contact patient with paperwork is done

## 2023-08-30 NOTE — Telephone Encounter (Signed)
 Called patient and left a message patient needs to sign his part of the paperwork in order to be faxed

## 2023-08-31 NOTE — Telephone Encounter (Signed)
 Paper work has been faxed, patient is aware

## 2023-09-01 ENCOUNTER — Ambulatory Visit: Payer: Self-pay | Admitting: Family Medicine

## 2023-09-11 ENCOUNTER — Telehealth: Payer: Self-pay

## 2023-09-11 NOTE — Telephone Encounter (Signed)
 Copied from CRM #[id number]. Topic: General - Call Back - No Documentation >> Sep 08, 2023  4:27 PM Roger Mendoza wrote: Reason for CRM: Pt needs to speak to clinic, he missed a call

## 2023-09-11 NOTE — Telephone Encounter (Signed)
 Called and left a VM, about Lab Results,

## 2023-09-20 ENCOUNTER — Encounter: Admitting: Family Medicine

## 2023-09-22 ENCOUNTER — Ambulatory Visit (INDEPENDENT_AMBULATORY_CARE_PROVIDER_SITE_OTHER): Admitting: Family Medicine

## 2023-09-22 ENCOUNTER — Other Ambulatory Visit

## 2023-09-22 ENCOUNTER — Encounter: Payer: Self-pay | Admitting: Family Medicine

## 2023-09-22 VITALS — BP 130/70 | HR 88 | Temp 98.6°F | Ht 73.0 in | Wt 311.5 lb

## 2023-09-22 DIAGNOSIS — Z125 Encounter for screening for malignant neoplasm of prostate: Secondary | ICD-10-CM

## 2023-09-22 DIAGNOSIS — Z0000 Encounter for general adult medical examination without abnormal findings: Secondary | ICD-10-CM | POA: Diagnosis not present

## 2023-09-22 LAB — HEMOGLOBIN A1C: Hgb A1c MFr Bld: 5.4 % (ref 4.6–6.5)

## 2023-09-22 LAB — COMPREHENSIVE METABOLIC PANEL WITH GFR
ALT: 29 U/L (ref 0–53)
AST: 22 U/L (ref 0–37)
Albumin: 4.9 g/dL (ref 3.5–5.2)
Alkaline Phosphatase: 77 U/L (ref 39–117)
BUN: 10 mg/dL (ref 6–23)
CO2: 25 meq/L (ref 19–32)
Calcium: 9.2 mg/dL (ref 8.4–10.5)
Chloride: 104 meq/L (ref 96–112)
Creatinine, Ser: 0.85 mg/dL (ref 0.40–1.50)
GFR: 92.26 mL/min (ref >60.00)
Glucose, Bld: 94 mg/dL (ref 70–99)
Potassium: 3.5 meq/L (ref 3.5–5.1)
Sodium: 141 meq/L (ref 135–145)
Total Bilirubin: 0.9 mg/dL (ref 0.2–1.2)
Total Protein: 7.7 g/dL (ref 6.0–8.3)

## 2023-09-22 LAB — CBC WITH DIFFERENTIAL/PLATELET
Basophils Absolute: 0 K/uL (ref 0.0–0.1)
Basophils Relative: 0.5 % (ref 0.0–3.0)
Eosinophils Absolute: 0.2 K/uL (ref 0.0–0.7)
Eosinophils Relative: 3 % (ref 0.0–5.0)
HCT: 39.2 % (ref 39.0–52.0)
Hemoglobin: 13.2 g/dL (ref 13.0–17.0)
Lymphocytes Relative: 30.8 % (ref 12.0–46.0)
Lymphs Abs: 2.1 K/uL (ref 0.7–4.0)
MCHC: 33.7 g/dL (ref 30.0–36.0)
MCV: 90.1 fl (ref 78.0–100.0)
Monocytes Absolute: 0.6 K/uL (ref 0.1–1.0)
Monocytes Relative: 8.1 % (ref 3.0–12.0)
Neutro Abs: 4 K/uL (ref 1.4–7.7)
Neutrophils Relative %: 57.6 % (ref 43.0–77.0)
Platelets: 230 K/uL (ref 150.0–400.0)
RBC: 4.34 Mil/uL (ref 4.22–5.81)
RDW: 13.9 % (ref 11.5–15.5)
WBC: 7 K/uL (ref 4.0–10.5)

## 2023-09-22 LAB — LIPID PANEL
Cholesterol: 194 mg/dL (ref 0–200)
HDL: 45.6 mg/dL (ref >39.00)
LDL Cholesterol: 120 mg/dL — ABNORMAL HIGH (ref 0–99)
NonHDL: 147.94
Total CHOL/HDL Ratio: 4
Triglycerides: 139 mg/dL (ref 0.0–149.0)
VLDL: 27.8 mg/dL (ref 0.0–40.0)

## 2023-09-22 LAB — PSA: PSA: 0.68 ng/mL (ref 0.10–4.00)

## 2023-09-22 LAB — T4, FREE: Free T4: 0.64 ng/dL (ref 0.60–1.60)

## 2023-09-22 LAB — TSH: TSH: 1.78 u[IU]/mL (ref 0.35–5.50)

## 2023-09-22 NOTE — Progress Notes (Signed)
 Established Patient Office Visit   Subjective  Patient ID: Roger Mendoza., male    DOB: [birth year]  Age: 63 y.o. MRN: [id number]  Chief Complaint  Patient presents with   Annual Exam    PT is a 63 yo male seen for CPE. Pt doing better since last OFV.  Has since had f/u with Urology for epidiymoochitis.  Repeat u/s normal.  Most of the edema has resolved.  Skin feeling pruritic.  Denies pain.    Patient Active Problem List   Diagnosis Date Noted   Sepsis (HCC) 08/10/2023   Epididymoorchitis 08/09/2023   History of colonic polyps    Benign neoplasm of transverse colon    Benign neoplasm of descending colon    Benign neoplasm of cecum    Benign neoplasm of ileocecal valve    Anal polyp    Vitamin D  deficiency 08/26/2020   Trigger thumb, left thumb 06/19/2020   Right lower lobe pulmonary nodule 02/28/2019   Abnormal CT of the chest 02/06/2019   Solitary pulmonary nodule 02/06/2019   Lung nodule 02/06/2019   Daytime somnolence 07/20/2016   Former smoker 05/11/2016   Other fatigue 05/11/2016   Hot flashes 05/11/2016   Morbid obesity (HCC) 05/11/2016   Skin lesion 05/11/2016   External hemorrhoid 05/11/2016   Erectile dysfunction 05/11/2016   Encounter for hepatitis C screening test for low risk patient 05/11/2016   Dyslipidemia 03/11/2016   De Quervain's tenosynovitis, left 12/21/2011   Routine general medical examination at a health care facility 11/02/2010   OSA on CPAP 03/11/2010   Past Medical History:  Diagnosis Date   Allergic rhinitis, seasonal    Allergy    seasonal   Cancer (HCC)    lung -  66yrs ago- is not under any kind of treatment   Colon polyp 02/2012   tubular adenoma, Dr. Gordy Albertus Sheerer Quervain's tenosynovitis, right    hx/o   Epididymitis    hx/o as adult   Erectile dysfunction    Family history of premature CAD    father   Heart murmur    Hemorrhoids    hx of   Hyperlipidemia    Left inguinal hernia    tiny as of 03/2014    MRSA (methicillin resistant staph aureus) culture positive    history of MRSA skin on pt's back/ unsure of date   Normal cardiac stress test    remote past per patient, [place], [place]   Obesity    OSA on CPAP    Recurrent UTI    Dr. Aleene, [facility]   Sleep apnea    uses c-pap   Tinea pedis    Tobacco use    4 cigarrettes daily   Wears glasses    Past Surgical History:  Procedure Laterality Date   ADENOIDECTOMY     BIOPSY  01/07/2021   Procedure: BIOPSY;  Surgeon: Albertus Gordy HERO, MD;  Location: [facility];  Service: Gastroenterology;;   CARDIAC CATHETERIZATION  02/11/2005   COLONOSCOPY  02/2012   Dr. Gordy Albertus, repeat 02/2017   COLONOSCOPY WITH PROPOFOL  N/A 01/07/2021   Procedure: COLONOSCOPY WITH PROPOFOL ;  Surgeon: Albertus Gordy HERO, MD;  Location: [facility];  Service: Gastroenterology;  Laterality: N/A;   ESOPHAGEAL DILATION     INTERCOSTAL NERVE BLOCK Right 02/28/2019   Procedure: Intercostal Nerve Block;  Surgeon: Shyrl Linnie KIDD, MD;  Location: [facility];  Service: Thoracic;  Laterality: Right;   NODE DISSECTION Right 02/28/2019  Procedure: Node Dissection;  Surgeon: Shyrl Linnie KIDD, MD;  Location: [facility] OR;  Service: Thoracic;  Laterality: Right;   POLYPECTOMY  01/07/2021   Procedure: POLYPECTOMY;  Surgeon: Albertus Gordy HERO, MD;  Location: [facility];  Service: Gastroenterology;;   ROTATOR CUFF REPAIR  1997   left side   SKIN SURGERY     large area of MRSA on back/ over 10 years ago   TONSILLECTOMY     UPPER GASTROINTESTINAL ENDOSCOPY     VIDEO BRONCHOSCOPY N/A 02/28/2019   Procedure: VIDEO BRONCHOSCOPY;  Surgeon: Shyrl Linnie KIDD, MD;  Location: [facility];  Service: Thoracic;  Laterality: N/A;   Social History   Tobacco Use   Smoking status: Former    Current packs/day: 0.00    Average packs/day: 0.3 packs/day for 15.0 years (3.8 ttl pk-yrs)    Types: Cigarettes    Start date: 03/31/1999    Quit date: 03/31/2014    Years since quitting: 9.4    Passive  exposure: Past   Smokeless tobacco: Never  Vaping Use   Vaping status: Never Used  Substance Use Topics   Alcohol use: Yes    Alcohol/week: 15.0 standard drinks of alcohol    Types: 3 Glasses of wine, 12 Cans of beer per week    Comment: couple glasses of wine per day   Drug use: No   Family History  Problem Relation Age of Onset   Hypertension Mother    Arthritis Mother    Hip dysplasia Mother    Heart disease Mother    Heart disease Father 71       died of MI   Hyperlipidemia Father    HIV Brother    Colon cancer Neg Hx    Esophageal cancer Neg Hx    Rectal cancer Neg Hx    Stomach cancer Neg Hx    Cancer Neg Hx    Diabetes Neg Hx    Stroke Neg Hx    Colitis Neg Hx    No Known Allergies  ROS Negative unless stated above    Objective:     BP 130/70   Pulse 88   Temp 98.6 F (37 C) (Oral)   Ht 6' 1 (1.854 m)   Wt (!) 311 lb 8 oz (141.3 kg)   SpO2 99%   BMI 41.10 kg/m  BP Readings from Last 3 Encounters:  09/22/23 130/70  08/17/23 130/68  08/11/23 139/73   Wt Readings from Last 3 Encounters:  09/22/23 (!) 311 lb 8 oz (141.3 kg)  08/17/23 (!) 309 lb (140.2 kg)  08/09/23 (!) 311 lb 15.2 oz (141.5 kg)      Physical Exam Constitutional:      Appearance: Normal appearance.  HENT:     Head: Normocephalic and atraumatic.     Right Ear: Tympanic membrane, ear canal and external ear normal.     Left Ear: Tympanic membrane, ear canal and external ear normal.     Nose: Nose normal.     Mouth/Throat:     Mouth: Mucous membranes are moist.     Pharynx: No oropharyngeal exudate or posterior oropharyngeal erythema.  Eyes:     General: No scleral icterus.    Extraocular Movements: Extraocular movements intact.     Conjunctiva/sclera: Conjunctivae normal.     Pupils: Pupils are equal, round, and reactive to light.  Neck:     Thyroid: No thyromegaly.     Vascular: No carotid bruit.  Cardiovascular:     Rate and  Rhythm: Normal rate and regular rhythm.      Pulses: Normal pulses.     Heart sounds: Normal heart sounds. No murmur heard.    No friction rub.  Pulmonary:     Effort: Pulmonary effort is normal.     Breath sounds: Normal breath sounds. No wheezing, rhonchi or rales.  Abdominal:     General: Bowel sounds are normal.     Palpations: Abdomen is soft.     Tenderness: There is no abdominal tenderness.  Musculoskeletal:        General: No deformity. Normal range of motion.  Lymphadenopathy:     Cervical: No cervical adenopathy.  Skin:    General: Skin is warm and dry.     Findings: No lesion.  Neurological:     General: No focal deficit present.     Mental Status: He is alert and oriented to person, place, and time.  Psychiatric:        Mood and Affect: Mood normal.        Thought Content: Thought content normal.        08/17/2023   10:38 AM 08/16/2023    1:15 PM 09/19/2022    9:14 AM  Depression screen PHQ 2/9  Decreased Interest 0 0 0  Down, Depressed, Hopeless 0 0 0  PHQ - 2 Score 0 0 0  Altered sleeping 0  0  Tired, decreased energy 0  0  Change in appetite 0  0  Feeling bad or failure about yourself  0  0  Trouble concentrating 0  0  Moving slowly or fidgety/restless 0  0  Suicidal thoughts 0  0  PHQ-9 Score 0  0  Difficult doing work/chores   Not difficult at all      08/17/2023   10:38 AM 09/19/2022    9:14 AM 08/21/2020    8:17 AM  GAD 7 : Generalized Anxiety Score  Nervous, Anxious, on Edge 0  0  Control/stop worrying 0 0 0  Worry too much - different things 0 0 0  Trouble relaxing 0 0 0  Restless 0 0 0  Easily annoyed or irritable 0 0 0  Afraid - awful might happen 0 0 0  Total GAD 7 Score 0  0  Anxiety Difficulty  Not difficult at all       Assessment & Plan:   Well adult exam -     Comprehensive metabolic panel with GFR; Future -     CBC with Differential/Platelet; Future -     Hemoglobin A1c; Future -     Lipid panel; Future -     TSH; Future -     T4, free; Future  Screening for  prostate cancer -     PSA; Future  Age appropriate health screenings discussed.  Obtain labs. Immunizations reviewed.  Pt declines vaccines.  Colonoscopy done 01/07/21.    Return if symptoms worsen or fail to improve.   Clotilda JONELLE Single, MD

## 2023-10-04 ENCOUNTER — Ambulatory Visit: Payer: Self-pay | Admitting: Family Medicine

## 2023-10-04 DIAGNOSIS — E786 Lipoprotein deficiency: Secondary | ICD-10-CM

## 2023-10-05 MED ORDER — ROSUVASTATIN CALCIUM 5 MG PO TABS: 5.0000 mg | ORAL_TABLET | Freq: Every day | ORAL | 3 refills | Status: AC

## 2024-03-27 ENCOUNTER — Other Ambulatory Visit

## 2024-04-03 ENCOUNTER — Ambulatory Visit: Admitting: Internal Medicine
